# Patient Record
Sex: Female | Born: 1935 | Race: White | Hispanic: No | Smoking: Never smoker
Health system: Southern US, Community
[De-identification: ages and names within clinical notes are randomized; demographics above are authoritative.]

## PROBLEM LIST (undated history)

## (undated) DIAGNOSIS — I10 Essential (primary) hypertension: Secondary | ICD-10-CM

## (undated) DIAGNOSIS — G4730 Sleep apnea, unspecified: Secondary | ICD-10-CM

## (undated) DIAGNOSIS — I4819 Other persistent atrial fibrillation: Secondary | ICD-10-CM

## (undated) DIAGNOSIS — I5042 Chronic combined systolic (congestive) and diastolic (congestive) heart failure: Secondary | ICD-10-CM

## (undated) DIAGNOSIS — N200 Calculus of kidney: Secondary | ICD-10-CM

## (undated) DIAGNOSIS — E785 Hyperlipidemia, unspecified: Secondary | ICD-10-CM

## (undated) DIAGNOSIS — I890 Lymphedema, not elsewhere classified: Secondary | ICD-10-CM

## (undated) DIAGNOSIS — I2510 Atherosclerotic heart disease of native coronary artery without angina pectoris: Secondary | ICD-10-CM

## (undated) DIAGNOSIS — C50919 Malignant neoplasm of unspecified site of unspecified female breast: Secondary | ICD-10-CM

## (undated) DIAGNOSIS — R519 Headache, unspecified: Secondary | ICD-10-CM

## (undated) DIAGNOSIS — D649 Anemia, unspecified: Secondary | ICD-10-CM

## (undated) DIAGNOSIS — E039 Hypothyroidism, unspecified: Secondary | ICD-10-CM

## (undated) DIAGNOSIS — R51 Headache: Secondary | ICD-10-CM

## (undated) DIAGNOSIS — I255 Ischemic cardiomyopathy: Secondary | ICD-10-CM

## (undated) DIAGNOSIS — R011 Cardiac murmur, unspecified: Secondary | ICD-10-CM

## (undated) DIAGNOSIS — M129 Arthropathy, unspecified: Secondary | ICD-10-CM

## (undated) DIAGNOSIS — E079 Disorder of thyroid, unspecified: Secondary | ICD-10-CM

## (undated) DIAGNOSIS — I219 Acute myocardial infarction, unspecified: Secondary | ICD-10-CM

## (undated) DIAGNOSIS — C801 Malignant (primary) neoplasm, unspecified: Secondary | ICD-10-CM

## (undated) HISTORY — PX: HX CORONARY STENT PLACEMENT: SHX49

## (undated) HISTORY — PX: HX KNEE SURGERY: 2100001320

## (undated) HISTORY — PX: HX LYMPH NODE DISSECTION: SHX13

## (undated) HISTORY — DX: Calculus of kidney: N20.0

## (undated) HISTORY — PX: CHOLECYSTECTOMY: SHX55

## (undated) HISTORY — DX: Hypothyroidism, unspecified: E03.9

## (undated) HISTORY — DX: Atherosclerotic heart disease of native coronary artery without angina pectoris: I25.10

## (undated) HISTORY — DX: Ischemic cardiomyopathy: I25.5

## (undated) HISTORY — PX: BACK SURGERY: SHX140

## (undated) HISTORY — DX: Other persistent atrial fibrillation: I48.19

## (undated) HISTORY — DX: Hyperlipidemia, unspecified: E78.5

## (undated) HISTORY — PX: TONSILLECTOMY: SUR1361

## (undated) HISTORY — DX: Malignant neoplasm of unspecified site of unspecified female breast: C50.919

## (undated) HISTORY — PX: ABDOMINAL HYSTERECTOMY: SHX81

## (undated) HISTORY — PX: REPLACEMENT TOTAL KNEE: SUR1224

## (undated) HISTORY — DX: Headache, unspecified: R51.9

## (undated) HISTORY — DX: Sleep apnea, unspecified: G47.30

## (undated) HISTORY — DX: Chronic combined systolic (congestive) and diastolic (congestive) heart failure: I50.42

## (undated) HISTORY — PX: CARDIAC CATHETERIZATION: SHX172

## (undated) HISTORY — DX: Anemia, unspecified: D64.9

## (undated) HISTORY — DX: Headache: R51

## (undated) HISTORY — DX: Lymphedema, not elsewhere classified: I89.0

## (undated) HISTORY — PX: BREAST LUMPECTOMY: SHX2

## (undated) HISTORY — DX: Essential (primary) hypertension: I10

---

## 2012-09-29 ENCOUNTER — Ambulatory Visit (HOSPITAL_COMMUNITY): Payer: Self-pay

## 2014-08-18 ENCOUNTER — Emergency Department: Payer: Self-pay | Admitting: Emergency Medicine

## 2014-08-31 ENCOUNTER — Emergency Department: Payer: Self-pay | Admitting: Student

## 2015-03-16 DIAGNOSIS — E559 Vitamin D deficiency, unspecified: Secondary | ICD-10-CM | POA: Diagnosis not present

## 2015-03-16 DIAGNOSIS — M79643 Pain in unspecified hand: Secondary | ICD-10-CM | POA: Diagnosis not present

## 2015-03-16 DIAGNOSIS — E039 Hypothyroidism, unspecified: Secondary | ICD-10-CM | POA: Diagnosis not present

## 2015-03-16 DIAGNOSIS — Z136 Encounter for screening for cardiovascular disorders: Secondary | ICD-10-CM | POA: Diagnosis not present

## 2015-03-16 DIAGNOSIS — I10 Essential (primary) hypertension: Secondary | ICD-10-CM | POA: Diagnosis not present

## 2015-03-16 DIAGNOSIS — Z13820 Encounter for screening for osteoporosis: Secondary | ICD-10-CM | POA: Diagnosis not present

## 2015-04-25 DIAGNOSIS — H35013 Changes in retinal vascular appearance, bilateral: Secondary | ICD-10-CM | POA: Diagnosis not present

## 2015-04-25 DIAGNOSIS — H4011X2 Primary open-angle glaucoma, moderate stage: Secondary | ICD-10-CM | POA: Diagnosis not present

## 2015-04-25 DIAGNOSIS — H35011 Changes in retinal vascular appearance, right eye: Secondary | ICD-10-CM | POA: Diagnosis not present

## 2015-04-25 DIAGNOSIS — Z9842 Cataract extraction status, left eye: Secondary | ICD-10-CM | POA: Diagnosis not present

## 2015-04-25 DIAGNOSIS — Z9841 Cataract extraction status, right eye: Secondary | ICD-10-CM | POA: Diagnosis not present

## 2015-04-27 DIAGNOSIS — E039 Hypothyroidism, unspecified: Secondary | ICD-10-CM | POA: Diagnosis not present

## 2015-05-10 DIAGNOSIS — Z853 Personal history of malignant neoplasm of breast: Secondary | ICD-10-CM | POA: Diagnosis not present

## 2015-05-10 DIAGNOSIS — Z1231 Encounter for screening mammogram for malignant neoplasm of breast: Secondary | ICD-10-CM | POA: Diagnosis not present

## 2015-05-14 DIAGNOSIS — M810 Age-related osteoporosis without current pathological fracture: Secondary | ICD-10-CM | POA: Diagnosis not present

## 2015-05-14 DIAGNOSIS — M8580 Other specified disorders of bone density and structure, unspecified site: Secondary | ICD-10-CM | POA: Diagnosis not present

## 2015-09-14 DIAGNOSIS — I739 Peripheral vascular disease, unspecified: Secondary | ICD-10-CM | POA: Diagnosis not present

## 2015-09-14 DIAGNOSIS — E559 Vitamin D deficiency, unspecified: Secondary | ICD-10-CM | POA: Diagnosis not present

## 2015-09-14 DIAGNOSIS — I10 Essential (primary) hypertension: Secondary | ICD-10-CM | POA: Diagnosis not present

## 2015-09-14 DIAGNOSIS — M79643 Pain in unspecified hand: Secondary | ICD-10-CM | POA: Diagnosis not present

## 2015-09-14 DIAGNOSIS — E785 Hyperlipidemia, unspecified: Secondary | ICD-10-CM | POA: Diagnosis not present

## 2015-09-14 DIAGNOSIS — R609 Edema, unspecified: Secondary | ICD-10-CM | POA: Diagnosis not present

## 2015-09-14 DIAGNOSIS — E039 Hypothyroidism, unspecified: Secondary | ICD-10-CM | POA: Diagnosis not present

## 2015-09-14 DIAGNOSIS — Z23 Encounter for immunization: Secondary | ICD-10-CM | POA: Diagnosis not present

## 2015-09-14 DIAGNOSIS — Z136 Encounter for screening for cardiovascular disorders: Secondary | ICD-10-CM | POA: Diagnosis not present

## 2015-10-24 DIAGNOSIS — H353112 Nonexudative age-related macular degeneration, right eye, intermediate dry stage: Secondary | ICD-10-CM | POA: Diagnosis not present

## 2015-10-24 DIAGNOSIS — Z9841 Cataract extraction status, right eye: Secondary | ICD-10-CM | POA: Diagnosis not present

## 2015-10-24 DIAGNOSIS — Z9842 Cataract extraction status, left eye: Secondary | ICD-10-CM | POA: Diagnosis not present

## 2015-10-24 DIAGNOSIS — H401132 Primary open-angle glaucoma, bilateral, moderate stage: Secondary | ICD-10-CM | POA: Diagnosis not present

## 2015-11-14 DIAGNOSIS — Z853 Personal history of malignant neoplasm of breast: Secondary | ICD-10-CM | POA: Diagnosis not present

## 2015-11-14 DIAGNOSIS — M7989 Other specified soft tissue disorders: Secondary | ICD-10-CM | POA: Diagnosis not present

## 2015-11-14 DIAGNOSIS — Z1231 Encounter for screening mammogram for malignant neoplasm of breast: Secondary | ICD-10-CM | POA: Diagnosis not present

## 2015-11-14 DIAGNOSIS — M79605 Pain in left leg: Secondary | ICD-10-CM | POA: Diagnosis not present

## 2015-12-06 DIAGNOSIS — I872 Venous insufficiency (chronic) (peripheral): Secondary | ICD-10-CM | POA: Diagnosis not present

## 2015-12-06 DIAGNOSIS — R609 Edema, unspecified: Secondary | ICD-10-CM | POA: Diagnosis not present

## 2015-12-06 DIAGNOSIS — I890 Lymphedema, not elsewhere classified: Secondary | ICD-10-CM | POA: Diagnosis not present

## 2016-04-17 DIAGNOSIS — I890 Lymphedema, not elsewhere classified: Secondary | ICD-10-CM | POA: Diagnosis not present

## 2016-04-17 DIAGNOSIS — I739 Peripheral vascular disease, unspecified: Secondary | ICD-10-CM | POA: Diagnosis not present

## 2016-04-17 DIAGNOSIS — I872 Venous insufficiency (chronic) (peripheral): Secondary | ICD-10-CM | POA: Diagnosis not present

## 2016-04-18 DIAGNOSIS — I872 Venous insufficiency (chronic) (peripheral): Secondary | ICD-10-CM | POA: Diagnosis not present

## 2016-04-23 DIAGNOSIS — Z9842 Cataract extraction status, left eye: Secondary | ICD-10-CM | POA: Diagnosis not present

## 2016-04-23 DIAGNOSIS — H353112 Nonexudative age-related macular degeneration, right eye, intermediate dry stage: Secondary | ICD-10-CM | POA: Diagnosis not present

## 2016-04-23 DIAGNOSIS — Z9841 Cataract extraction status, right eye: Secondary | ICD-10-CM | POA: Diagnosis not present

## 2016-04-23 DIAGNOSIS — H401132 Primary open-angle glaucoma, bilateral, moderate stage: Secondary | ICD-10-CM | POA: Diagnosis not present

## 2016-04-23 DIAGNOSIS — H353122 Nonexudative age-related macular degeneration, left eye, intermediate dry stage: Secondary | ICD-10-CM | POA: Diagnosis not present

## 2016-05-20 DIAGNOSIS — Z1231 Encounter for screening mammogram for malignant neoplasm of breast: Secondary | ICD-10-CM | POA: Diagnosis not present

## 2016-06-18 DIAGNOSIS — I871 Compression of vein: Secondary | ICD-10-CM | POA: Diagnosis not present

## 2016-06-18 DIAGNOSIS — R600 Localized edema: Secondary | ICD-10-CM | POA: Diagnosis not present

## 2016-06-27 DIAGNOSIS — R600 Localized edema: Secondary | ICD-10-CM | POA: Diagnosis not present

## 2016-06-27 DIAGNOSIS — I700 Atherosclerosis of aorta: Secondary | ICD-10-CM | POA: Diagnosis not present

## 2016-07-04 DIAGNOSIS — Z23 Encounter for immunization: Secondary | ICD-10-CM | POA: Diagnosis not present

## 2016-07-17 DIAGNOSIS — Z9221 Personal history of antineoplastic chemotherapy: Secondary | ICD-10-CM | POA: Diagnosis not present

## 2016-07-17 DIAGNOSIS — R600 Localized edema: Secondary | ICD-10-CM | POA: Diagnosis not present

## 2016-07-17 DIAGNOSIS — K5900 Constipation, unspecified: Secondary | ICD-10-CM | POA: Diagnosis not present

## 2016-07-17 DIAGNOSIS — Z9049 Acquired absence of other specified parts of digestive tract: Secondary | ICD-10-CM | POA: Diagnosis not present

## 2016-07-17 DIAGNOSIS — K869 Disease of pancreas, unspecified: Secondary | ICD-10-CM | POA: Diagnosis not present

## 2016-07-17 DIAGNOSIS — I700 Atherosclerosis of aorta: Secondary | ICD-10-CM | POA: Diagnosis not present

## 2016-07-17 DIAGNOSIS — K7689 Other specified diseases of liver: Secondary | ICD-10-CM | POA: Diagnosis not present

## 2016-07-17 DIAGNOSIS — Z923 Personal history of irradiation: Secondary | ICD-10-CM | POA: Diagnosis not present

## 2016-07-17 DIAGNOSIS — R933 Abnormal findings on diagnostic imaging of other parts of digestive tract: Secondary | ICD-10-CM | POA: Diagnosis not present

## 2016-07-17 DIAGNOSIS — Z853 Personal history of malignant neoplasm of breast: Secondary | ICD-10-CM | POA: Diagnosis not present

## 2016-07-17 DIAGNOSIS — D489 Neoplasm of uncertain behavior, unspecified: Secondary | ICD-10-CM | POA: Diagnosis not present

## 2016-09-25 DIAGNOSIS — J189 Pneumonia, unspecified organism: Secondary | ICD-10-CM | POA: Diagnosis not present

## 2017-01-13 ENCOUNTER — Ambulatory Visit: Payer: 59 | Attending: Family Medicine

## 2017-01-16 ENCOUNTER — Encounter: Payer: Self-pay | Admitting: Family Medicine

## 2017-01-16 ENCOUNTER — Encounter (INDEPENDENT_AMBULATORY_CARE_PROVIDER_SITE_OTHER): Payer: Self-pay

## 2017-01-16 ENCOUNTER — Ambulatory Visit (INDEPENDENT_AMBULATORY_CARE_PROVIDER_SITE_OTHER): Payer: MEDICARE | Admitting: Family Medicine

## 2017-01-16 VITALS — BP 116/68 | HR 46 | Temp 97.6°F | Ht 65.5 in | Wt 190.8 lb

## 2017-01-16 DIAGNOSIS — I878 Other specified disorders of veins: Secondary | ICD-10-CM | POA: Insufficient documentation

## 2017-01-16 DIAGNOSIS — I10 Essential (primary) hypertension: Secondary | ICD-10-CM

## 2017-01-16 DIAGNOSIS — H409 Unspecified glaucoma: Secondary | ICD-10-CM | POA: Insufficient documentation

## 2017-01-16 DIAGNOSIS — E039 Hypothyroidism, unspecified: Secondary | ICD-10-CM | POA: Diagnosis not present

## 2017-01-16 DIAGNOSIS — I4891 Unspecified atrial fibrillation: Secondary | ICD-10-CM | POA: Diagnosis not present

## 2017-01-16 DIAGNOSIS — I890 Lymphedema, not elsewhere classified: Secondary | ICD-10-CM | POA: Diagnosis not present

## 2017-01-16 DIAGNOSIS — I499 Cardiac arrhythmia, unspecified: Secondary | ICD-10-CM

## 2017-01-16 DIAGNOSIS — E785 Hyperlipidemia, unspecified: Secondary | ICD-10-CM | POA: Diagnosis not present

## 2017-01-16 DIAGNOSIS — Z853 Personal history of malignant neoplasm of breast: Secondary | ICD-10-CM | POA: Diagnosis not present

## 2017-01-16 HISTORY — DX: Hypothyroidism, unspecified: E03.9

## 2017-01-16 HISTORY — DX: Hyperlipidemia, unspecified: E78.5

## 2017-01-16 HISTORY — DX: Lymphedema, not elsewhere classified: I89.0

## 2017-01-16 HISTORY — DX: Essential (primary) hypertension: I10

## 2017-01-16 LAB — COMPREHENSIVE METABOLIC PANEL
ALK PHOS: 41 U/L (ref 39–117)
ALT: 10 U/L (ref 0–35)
AST: 14 U/L (ref 0–37)
Albumin: 3.5 g/dL (ref 3.5–5.2)
BUN: 16 mg/dL (ref 6–23)
CO2: 31 meq/L (ref 19–32)
Calcium: 11.2 mg/dL — ABNORMAL HIGH (ref 8.4–10.5)
Chloride: 101 mEq/L (ref 96–112)
Creatinine, Ser: 1.33 mg/dL — ABNORMAL HIGH (ref 0.40–1.20)
GFR: 40.22 mL/min — AB (ref >60.00)
GLUCOSE: 97 mg/dL (ref 70–99)
Potassium: 3.3 mEq/L — ABNORMAL LOW (ref 3.5–5.1)
Sodium: 140 mEq/L (ref 135–145)
TOTAL PROTEIN: 6.8 g/dL (ref 6.0–8.3)
Total Bilirubin: 1.5 mg/dL — ABNORMAL HIGH (ref 0.2–1.2)

## 2017-01-16 LAB — LIPID PANEL
CHOLESTEROL: 136 mg/dL (ref 0–200)
HDL: 37.6 mg/dL — AB (ref >39.00)
LDL Cholesterol: 73 mg/dL (ref 0–99)
NONHDL: 98.43
Total CHOL/HDL Ratio: 4
Triglycerides: 129 mg/dL (ref 0.0–149.0)
VLDL: 25.8 mg/dL (ref 0.0–40.0)

## 2017-01-16 LAB — CBC
HEMATOCRIT: 39.4 % (ref 36.0–46.0)
HEMOGLOBIN: 13.1 g/dL (ref 12.0–15.0)
MCHC: 33.3 g/dL (ref 30.0–36.0)
MCV: 91.5 fl (ref 78.0–100.0)
PLATELETS: 304 10*3/uL (ref 150.0–400.0)
RBC: 4.31 Mil/uL (ref 3.87–5.11)
RDW: 14.5 % (ref 11.5–15.5)
WBC: 8.7 10*3/uL (ref 4.0–10.5)

## 2017-01-16 LAB — TSH: TSH: 2.66 u[IU]/mL (ref 0.35–4.50)

## 2017-01-16 NOTE — Assessment & Plan Note (Signed)
Irregularly irregular rhythm noted on exam. EKG was obtained and revealed atrial fibrillation. CHADS-Vasc score of 4. HAS-BLED score = 1. Patient has fallen 2 times in the recent past. Patient is a candidate for anticoagulation. Will await renal function prior to starting. Will discuss with cardiology to see if they agree.

## 2017-01-16 NOTE — Assessment & Plan Note (Signed)
Stable. Continue current dose of Synthroid. Labs today.

## 2017-01-16 NOTE — Patient Instructions (Addendum)
Let me know if she wants a mammogram.  Stop Amlodipine.  Continue all other medications.  Follow up in 1 month.  Take care  Dr. Lacinda Axon

## 2017-01-16 NOTE — Assessment & Plan Note (Signed)
Continue Zocor. Labs today.

## 2017-01-16 NOTE — Assessment & Plan Note (Signed)
Arm swelling appears to be from lymphedema. Patient is not bothered by this so will not proceed with compression sleeve.

## 2017-01-16 NOTE — Progress Notes (Signed)
Subjective:  Patient ID: Stephanie Contreras, female    DOB: February 14, 1929  Age: 83 y.o. MRN: [id number]  CC: Establish care  HPI Stephanie Contreras is a 83 y.o. female presents to the clinic today to establish care. Issues are below.  Hypertension  Patient reports long-standing hypertension.  She is currently on Lasix 40 mg daily and supplemental potassium. She is also on amlodipine 10 mg daily.  She endorses compliance.  She often gets dizziness which she attributes to a drop in her blood pressure.  We discussed her medications today.  Hyperlipidemia  Currently on simvastatin.  Unsure of control as no labs are available.  Hypothyroidism  Patient stable and a symptomatic on Synthroid 125 MCG.  Needs labs today.  Swelling of the right arm  Patient has a history of breast cancer.   Recently developed swelling of the right upper extremity, which is likely from lymphedema.  She is not bothered by it and states that she has no pain or no difficulty using her upper extremity.   PMH, Surgical Hx, Family Hx, Social History reviewed and updated as below.  Past Medical History:  Diagnosis Date  . Breast cancer ([place])    Right  . Essential hypertension 01/16/2017  . Frequent headaches   . Hyperlipidemia 01/16/2017  . Hypothyroidism 01/16/2017  . Kidney stones   . Lymphedema 01/16/2017   Past Surgical History:  Procedure Laterality Date  . ABDOMINAL HYSTERECTOMY    . BACK SURGERY    . BREAST LUMPECTOMY Right   . CHOLECYSTECTOMY    . REPLACEMENT TOTAL KNEE    . TONSILLECTOMY     Family History  Problem Relation Age of Onset  . Breast cancer Sister   . Lung cancer Sister    Social History  Substance Use Topics  . Smoking status: Never Smoker  . Smokeless tobacco: Never Used  . Alcohol use No    Review of Systems  HENT: Positive for hearing loss.   Eyes: Positive for visual disturbance.  Cardiovascular: Positive for leg swelling.  Neurological: Positive for  dizziness and headaches.  All other systems reviewed and are negative.   Objective:   Today's Vitals: BP 116/68   Pulse (!) 46   Temp 97.6 F (36.4 C)   Ht 5' 5.5" (1.664 m)   Wt 190 lb 12.8 oz (86.5 kg)   SpO2 98%   BMI 31.27 kg/m   Physical Exam  Constitutional: She appears well-developed. No distress.  HENT:  Mouth/Throat: Oropharynx is clear and moist.  Poor dentition.  Eyes: Conjunctivae are normal. Pupils are equal, round, and reactive to light.  Neck: Neck supple.  Cardiovascular:  Irregularly irregular. 2/6 systolic murmur.  Pulmonary/Chest: Effort normal and breath sounds normal. She has no wheezes. She has no rales.  Abdominal: Soft. She exhibits no distension. There is no tenderness. There is no rebound and no guarding.  Lymphadenopathy:    She has no cervical adenopathy.  Neurological: She is alert.  Skin:  Lower extremities with venous stasis changes/erythema.  Psychiatric: She has a normal mood and affect.  Vitals reviewed.  Assessment & Plan:   Problem List Items Addressed This Visit    History of breast cancer   Relevant Orders   CBC   Lymphedema    Arm swelling appears to be from lymphedema. Patient is not bothered by this so will not proceed with compression sleeve.      Relevant Orders   CBC   Essential hypertension - Primary  Her blood pressures are well controlled, likely to well controlled. Stopping amlodipine.      Relevant Medications   furosemide (LASIX) 40 MG tablet   simvastatin (ZOCOR) 40 MG tablet   Other Relevant Orders   Comp Met (CMET)   Hyperlipidemia    Continue Zocor. Labs today.      Relevant Medications   furosemide (LASIX) 40 MG tablet   simvastatin (ZOCOR) 40 MG tablet   Other Relevant Orders   Lipid panel   Hypothyroidism    Stable. Continue current dose of Synthroid. Labs today.      Relevant Medications   levothyroxine (SYNTHROID, LEVOTHROID) 125 MCG tablet   Other Relevant Orders   TSH   New onset  atrial fibrillation (HCC)    Irregularly irregular rhythm noted on exam. EKG was obtained and revealed atrial fibrillation. CHADS-Vasc score of 4. HAS-BLED score = 1. Patient has fallen 2 times in the recent past. Patient is a candidate for anticoagulation. Will await renal function prior to starting. Will discuss with cardiology to see if they agree.       Relevant Medications   furosemide (LASIX) 40 MG tablet   simvastatin (ZOCOR) 40 MG tablet   Other Relevant Orders   EKG 12-Lead (Completed)   Comp Met (CMET)      Outpatient Encounter Prescriptions as of 01/16/2017  Medication Sig  . Calcium Carb-Cholecalciferol (CALTRATE 600+D3 SOFT PO) Take by mouth 3 (three) times daily before meals.  . furosemide (LASIX) 40 MG tablet Take 40 mg by mouth daily.  Marland Kitchen levothyroxine (SYNTHROID, LEVOTHROID) 125 MCG tablet Take 125 mcg by mouth daily.  . Multiple Vitamins-Minerals (EYE VITAMINS PO) Take by mouth 2 (two) times daily.  . Multiple Vitamins-Minerals (MULTIVITAMIN ADULT PO) Take by mouth. Centrum silver  . OVER THE COUNTER MEDICATION combrigan eye drops. 1 drop in each eye BID.  Marland Kitchen POTASSIUM CHLORIDE ER PO Take 10 mg by mouth daily.  . simvastatin (ZOCOR) 40 MG tablet Take 40 mg by mouth daily.  . [DISCONTINUED] amLODipine (NORVASC) 10 MG tablet Take 10 mg by mouth daily.   No facility-administered encounter medications on file as of 01/16/2017.     Follow-up: Return in about 1 month (around 02/16/2017).  [place]

## 2017-01-16 NOTE — Progress Notes (Signed)
Pre visit review using our clinic review tool, if applicable. No additional management support is needed unless otherwise documented below in the visit note. 

## 2017-01-16 NOTE — Assessment & Plan Note (Signed)
Her blood pressures are well controlled, likely to well controlled. Stopping amlodipine.

## 2017-01-19 ENCOUNTER — Ambulatory Visit: Payer: 59

## 2017-01-20 ENCOUNTER — Other Ambulatory Visit: Payer: Self-pay | Admitting: Family Medicine

## 2017-01-20 MED ORDER — POTASSIUM CHLORIDE CRYS ER 20 MEQ PO TBCR: 40.0000 meq | EXTENDED_RELEASE_TABLET | Freq: Every day | ORAL | 0 refills | Status: DC

## 2017-01-21 ENCOUNTER — Other Ambulatory Visit: Payer: Self-pay | Admitting: Family Medicine

## 2017-01-21 ENCOUNTER — Ambulatory Visit: Payer: 59

## 2017-02-04 ENCOUNTER — Other Ambulatory Visit: Payer: MEDICARE

## 2017-02-05 ENCOUNTER — Other Ambulatory Visit (INDEPENDENT_AMBULATORY_CARE_PROVIDER_SITE_OTHER): Payer: MEDICARE

## 2017-02-05 LAB — COMPREHENSIVE METABOLIC PANEL
ALBUMIN: 3.3 g/dL — AB (ref 3.5–5.2)
ALT: 15 U/L (ref 0–35)
AST: 17 U/L (ref 0–37)
Alkaline Phosphatase: 44 U/L (ref 39–117)
BUN: 16 mg/dL (ref 6–23)
CALCIUM: 10.6 mg/dL — AB (ref 8.4–10.5)
CHLORIDE: 105 meq/L (ref 96–112)
CO2: 31 mEq/L (ref 19–32)
Creatinine, Ser: 1.15 mg/dL (ref 0.40–1.20)
GFR: 47.56 mL/min — AB (ref >60.00)
Glucose, Bld: 110 mg/dL — ABNORMAL HIGH (ref 70–99)
POTASSIUM: 3.5 meq/L (ref 3.5–5.1)
Sodium: 144 mEq/L (ref 135–145)
Total Bilirubin: 0.8 mg/dL (ref 0.2–1.2)
Total Protein: 6.4 g/dL (ref 6.0–8.3)

## 2017-02-06 ENCOUNTER — Telehealth: Payer: Self-pay

## 2017-02-06 LAB — PARATHYROID HORMONE, INTACT (NO CA): PTH: 32 pg/mL (ref 14–64)

## 2017-02-06 NOTE — Telephone Encounter (Signed)
Pt called and gave verbal okay for referral.

## 2017-02-06 NOTE — Telephone Encounter (Signed)
-----   Message from Coral Spikes, DO sent at 02/06/2017  4:16 PM EDT ----- Spoke with cardiology.  They would like to see her.  She okay with that?  Dr. Lacinda Axon

## 2017-02-09 NOTE — Telephone Encounter (Signed)
See results note. 

## 2017-02-09 NOTE — Telephone Encounter (Signed)
Pt's granddaughter called and said to go ahead and put the referral in for cardiology. Granddaughter says that they can do any day but 4/19.

## 2017-02-10 ENCOUNTER — Other Ambulatory Visit: Payer: Self-pay | Admitting: Family Medicine

## 2017-02-10 DIAGNOSIS — I4891 Unspecified atrial fibrillation: Secondary | ICD-10-CM

## 2017-02-16 ENCOUNTER — Encounter: Payer: Self-pay | Admitting: Family Medicine

## 2017-02-16 ENCOUNTER — Ambulatory Visit (INDEPENDENT_AMBULATORY_CARE_PROVIDER_SITE_OTHER): Payer: MEDICARE | Admitting: Family Medicine

## 2017-02-16 DIAGNOSIS — G4733 Obstructive sleep apnea (adult) (pediatric): Secondary | ICD-10-CM

## 2017-02-16 DIAGNOSIS — I10 Essential (primary) hypertension: Secondary | ICD-10-CM | POA: Diagnosis not present

## 2017-02-16 DIAGNOSIS — I4891 Unspecified atrial fibrillation: Secondary | ICD-10-CM | POA: Diagnosis not present

## 2017-02-16 NOTE — Patient Instructions (Addendum)
We will arrange your sleep study.   Call Endo.  Be sure to see cardiology.  Follow up in 6 months.  Take care  Dr. Lacinda Axon

## 2017-02-16 NOTE — Progress Notes (Signed)
Pre visit review using our clinic review tool, if applicable. No additional management support is needed unless otherwise documented below in the visit note. 

## 2017-02-16 NOTE — Progress Notes (Signed)
Subjective:  Patient ID: Stephanie Contreras, female    DOB: [birth year]  Age: 83 y.o. MRN: [id number]  CC: Follow up  HPI:  83 year old female with  Afib, Hypothyroidism, Hyperlipidemia, Hypercalcemia, Hypertension presents for follow-up.  Atrial fibrillation  Found at last visit.  She has an upcoming cardiology appointment.  I did not start her on anticoagulation given prior falls.  Cardiologist to discuss at upcoming visit in May. She is currently asymptomatic.  HTN  Fair control given advanced age.  Currently on Lasix.  Hypercalcemia  Initial calcium was 11.2.  Repeat was 10.6.  Normal PTH.  I placed a referral to endocrinology and they have contacted the patient. They were unable to get her on the phone.  Currently asymptomatic. Has a prior history of kidney stones.  Difficulty sleeping  Patient reports that she does not sleep well.  She states that this is due to the fact that she no longer has a working CPAP.  She would like to have a repeat sleep study and if confirms a CPAP device.  Social Hx   Social History   Social History  . Marital status: Widowed    Spouse name: N/A  . Number of children: N/A  . Years of education: N/A   Social History Main Topics  . Smoking status: Never Smoker  . Smokeless tobacco: Never Used  . Alcohol use No  . Drug use: No  . Sexual activity: Not Asked   Other Topics Concern  . None   Social History Narrative  . None    Review of Systems  Constitutional: Positive for fatigue.  Psychiatric/Behavioral: Positive for sleep disturbance.   Objective:  BP (!) 154/88   Pulse 95   Temp 97.6 F (36.4 C) (Oral)   Wt 185 lb 6 oz (84.1 kg)   SpO2 93%   BMI 30.38 kg/m   BP/Weight 02/16/2017 0/25/8527  Systolic BP 782 423  Diastolic BP 88 68  Wt. (Lbs) 185.38 190.8  BMI 30.38 31.27   Physical Exam  Constitutional: She appears well-developed. No distress.  Cardiovascular: An irregularly irregular rhythm present.   Pulmonary/Chest: Effort normal and breath sounds normal.  Neurological: She is alert.  Psychiatric: She has a normal mood and affect.  Vitals reviewed.   Lab Results  Component Value Date   WBC 8.7 01/16/2017   HGB 13.1 01/16/2017   HCT 39.4 01/16/2017   PLT 304.0 01/16/2017   GLUCOSE 110 (H) 02/05/2017   CHOL 136 01/16/2017   TRIG 129.0 01/16/2017   HDL 37.60 (L) 01/16/2017   LDLCALC 73 01/16/2017   ALT 15 02/05/2017   AST 17 02/05/2017   NA 144 02/05/2017   K 3.5 02/05/2017   CL 105 02/05/2017   CREATININE 1.15 02/05/2017   BUN 16 02/05/2017   CO2 31 02/05/2017   TSH 2.66 01/16/2017    Assessment & Plan:   Problem List Items Addressed This Visit    OSA (obstructive sleep apnea)    Patient endorsing OSA. Sleep study.      Relevant Orders   Split night study   New onset atrial fibrillation [facility])    Awaiting cardiology appt. Unsure if benefit outweighs risk given age and falls. Appt 5/3.      Hypercalcemia    Persistent. Low-Normal PTH. Advised to call endo for appt.      Essential hypertension    Fair control. Continue Lasix.         Follow-up: 6 months  Randy Castrejon  [place]

## 2017-02-16 NOTE — Assessment & Plan Note (Signed)
Persistent. Low-Normal PTH. Advised to call endo for appt.

## 2017-02-16 NOTE — Assessment & Plan Note (Signed)
Patient endorsing OSA. Sleep study.

## 2017-02-16 NOTE — Assessment & Plan Note (Signed)
Awaiting cardiology appt. Unsure if benefit outweighs risk given age and falls. Appt 5/3.

## 2017-02-16 NOTE — Assessment & Plan Note (Signed)
Fair control. Continue Lasix.

## 2017-02-24 ENCOUNTER — Encounter: Payer: Self-pay | Admitting: Emergency Medicine

## 2017-02-24 ENCOUNTER — Emergency Department: Payer: MEDICARE

## 2017-02-24 ENCOUNTER — Emergency Department
Admission: EM | Admit: 2017-02-24 | Discharge: 2017-02-24 | Disposition: A | Discharge status: Home / Self Care | Payer: MEDICARE | Source: Home / Self Care | Attending: Emergency Medicine | Admitting: Emergency Medicine

## 2017-02-24 DIAGNOSIS — I248 Other forms of acute ischemic heart disease: Secondary | ICD-10-CM | POA: Diagnosis not present

## 2017-02-24 DIAGNOSIS — I10 Essential (primary) hypertension: Secondary | ICD-10-CM | POA: Insufficient documentation

## 2017-02-24 DIAGNOSIS — R0789 Other chest pain: Secondary | ICD-10-CM | POA: Diagnosis not present

## 2017-02-24 DIAGNOSIS — I509 Heart failure, unspecified: Secondary | ICD-10-CM | POA: Diagnosis not present

## 2017-02-24 DIAGNOSIS — I5021 Acute systolic (congestive) heart failure: Secondary | ICD-10-CM | POA: Diagnosis not present

## 2017-02-24 DIAGNOSIS — E039 Hypothyroidism, unspecified: Secondary | ICD-10-CM | POA: Insufficient documentation

## 2017-02-24 DIAGNOSIS — I429 Cardiomyopathy, unspecified: Secondary | ICD-10-CM | POA: Diagnosis not present

## 2017-02-24 DIAGNOSIS — Z853 Personal history of malignant neoplasm of breast: Secondary | ICD-10-CM

## 2017-02-24 DIAGNOSIS — Z79899 Other long term (current) drug therapy: Secondary | ICD-10-CM | POA: Insufficient documentation

## 2017-02-24 DIAGNOSIS — J189 Pneumonia, unspecified organism: Secondary | ICD-10-CM

## 2017-02-24 DIAGNOSIS — R0602 Shortness of breath: Secondary | ICD-10-CM | POA: Diagnosis not present

## 2017-02-24 DIAGNOSIS — I110 Hypertensive heart disease with heart failure: Secondary | ICD-10-CM | POA: Diagnosis not present

## 2017-02-24 DIAGNOSIS — N179 Acute kidney failure, unspecified: Secondary | ICD-10-CM | POA: Diagnosis not present

## 2017-02-24 LAB — BASIC METABOLIC PANEL
Anion gap: 8 (ref 5–15)
BUN: 12 mg/dL (ref 6–20)
CALCIUM: 9.2 mg/dL (ref 8.9–10.3)
CO2: 26 mmol/L (ref 22–32)
CREATININE: 0.96 mg/dL (ref 0.44–1.00)
Chloride: 105 mmol/L (ref 101–111)
GFR calc non Af Amer: 52 mL/min — ABNORMAL LOW (ref >60)
GLUCOSE: 107 mg/dL — AB (ref 65–99)
Potassium: 4 mmol/L (ref 3.5–5.1)
Sodium: 139 mmol/L (ref 135–145)

## 2017-02-24 LAB — CBC
HCT: 40.2 % (ref 35.0–47.0)
Hemoglobin: 13.4 g/dL (ref 12.0–16.0)
MCH: 30 pg (ref 26.0–34.0)
MCHC: 33.4 g/dL (ref 32.0–36.0)
MCV: 90 fL (ref 80.0–100.0)
PLATELETS: 387 10*3/uL (ref 150–440)
RBC: 4.46 MIL/uL (ref 3.80–5.20)
RDW: 14.7 % — AB (ref 11.5–14.5)
WBC: 10.9 10*3/uL (ref 3.6–11.0)

## 2017-02-24 LAB — TROPONIN I

## 2017-02-24 MED ORDER — LEVOFLOXACIN 750 MG PO TABS
750.0000 mg | ORAL_TABLET | Freq: Every day | ORAL | 0 refills | Status: DC
Start: 2017-02-24 — End: 2017-03-06

## 2017-02-24 MED ORDER — SODIUM CHLORIDE 0.9 % IV BOLUS (SEPSIS)
500.0000 mL | Freq: Once | INTRAVENOUS | Status: AC
  Administered 2017-02-24: 500 mL via INTRAVENOUS

## 2017-02-24 MED ORDER — LEVOFLOXACIN IN D5W 750 MG/150ML IV SOLN
750.0000 mg | Freq: Once | INTRAVENOUS | Status: AC
  Administered 2017-02-24: 750 mg via INTRAVENOUS
  Filled 2017-02-24: qty 150

## 2017-02-24 NOTE — ED Notes (Addendum)
Pt. States that last night she laid down and couldn't breath. States that it happened again today. Has a history of A-Fib. Dr. Lacinda Axon referred her to a heart specialists at [facility]. Her appointment is on May 3 at 1:00. Pt reports no pain just "tightness". She is extremely out of breath. History of breast cancer on right side

## 2017-02-24 NOTE — ED Triage Notes (Signed)
Patient presents to ED via POV from home with c/o CP and SOB x 3 days. Hx of a fib. A&O x4. Hard of hearing.

## 2017-02-24 NOTE — Discharge Instructions (Signed)
Please seek medical attention for any high fevers, chest pain, shortness of breath, change in behavior, persistent vomiting, bloody stool or any other new or concerning symptoms.  

## 2017-02-24 NOTE — ED Notes (Signed)
PT up to toilet with one assist. Pt reported wanting to ambulate around the room. Pt became SOB and grabbed chest reporting it was hard to breath. Pt assisted back into bed. No SOB at this time while at rest.

## 2017-02-24 NOTE — ED Provider Notes (Signed)
[facility] Emergency Department Provider Note   ____________________________________________   I have reviewed the triage vital signs and the nursing notes.   HISTORY  Chief Complaint Chest Pain   History limited by: Not Limited   HPI Stephanie Contreras is a 83 y.o. female who presents to the emergency department today because of concerns for weakness, shortness of breath and dizziness. She states the symptoms started last night. Continued throughout the night and the patient was not able to sleep well. States she has had a little, but it does not sound like it's been productive. Patient denied any chest pain. Patient denied any fevers.   Past Medical History:  Diagnosis Date  . Atrial fibrillation ([place])   . Breast cancer ([place])    Right  . Essential hypertension 01/16/2017  . Frequent headaches   . Hyperlipidemia 01/16/2017  . Hypothyroidism 01/16/2017  . Kidney stones   . Lymphedema 01/16/2017    Patient Active Problem List   Diagnosis Date Noted  . OSA (obstructive sleep apnea) 02/16/2017  . Hypercalcemia 02/10/2017  . History of breast cancer 01/16/2017  . Lymphedema 01/16/2017  . Essential hypertension 01/16/2017  . Hyperlipidemia 01/16/2017  . Hypothyroidism 01/16/2017  . Venous stasis 01/16/2017  . Glaucoma 01/16/2017  . New onset atrial fibrillation ([place]) 01/16/2017    Past Surgical History:  Procedure Laterality Date  . ABDOMINAL HYSTERECTOMY    . BACK SURGERY    . BREAST LUMPECTOMY Right   . CHOLECYSTECTOMY    . REPLACEMENT TOTAL KNEE    . TONSILLECTOMY      Prior to Admission medications   Medication Sig Start Date End Date Taking? Authorizing Provider  Calcium Carb-Cholecalciferol (CALTRATE 600+D3 SOFT PO) Take by mouth 3 (three) times daily before meals.    Historical Provider, MD  furosemide (LASIX) 40 MG tablet Take 40 mg by mouth daily.    Historical Provider, MD  levothyroxine (SYNTHROID, LEVOTHROID) 125 MCG tablet Take  125 mcg by mouth daily.    Historical Provider, MD  Multiple Vitamins-Minerals (EYE VITAMINS PO) Take by mouth 2 (two) times daily.    Historical Provider, MD  Multiple Vitamins-Minerals (MULTIVITAMIN ADULT PO) Take by mouth. Centrum silver    Historical Provider, MD  OVER THE COUNTER MEDICATION combrigan eye drops. 1 drop in each eye BID.    Historical Provider, MD  potassium chloride SA (K-DUR,KLOR-CON) 20 MEQ tablet Take 2 tablets (40 mEq total) by mouth daily. 01/20/17   Coral Spikes, DO  simvastatin (ZOCOR) 40 MG tablet Take 40 mg by mouth daily.    Historical Provider, MD    Allergies Patient has no known allergies.  Family History  Problem Relation Age of Onset  . Breast cancer Sister   . Lung cancer Sister     Social History Social History  Substance Use Topics  . Smoking status: Never Smoker  . Smokeless tobacco: Never Used  . Alcohol use No    Review of Systems Constitutional: No fever. Positive for weakness.  Eyes: No visual changes. ENT: No sore throat. Cardiovascular: Denies chest pain. Respiratory: Positive for shortness of breath. Gastrointestinal: No abdominal pain.  No nausea, no vomiting.  No diarrhea.   Genitourinary: Negative for dysuria. Musculoskeletal: Negative for back pain. Skin: Negative for rash. Neurological: Positive for dizziness. ____________________________________________   PHYSICAL EXAM:  VITAL SIGNS: ED Triage Vitals  Enc Vitals Group     BP 02/24/17 1642 (!) 143/101     Pulse Rate 02/24/17 1642 88  Resp 02/24/17 1642 17     Temp 02/24/17 1642 98 F (36.7 C)     Temp Source 02/24/17 1642 Oral     SpO2 02/24/17 1642 95 %     Weight 02/24/17 1639 180 lb (81.6 kg)     Height 02/24/17 1639 5\' 9"  (1.753 m)     Head Circumference --      Peak Flow --      Pain Score 02/24/17 1638 5   Constitutional: Alert and oriented. Well appearing and in no distress. Eyes: Conjunctivae are normal. Normal extraocular movements. ENT    Head: Normocephalic and atraumatic.   Nose: No congestion/rhinnorhea.   Mouth/Throat: Mucous membranes are moist.   Neck: No stridor. Hematological/Lymphatic/Immunilogical: No cervical lymphadenopathy. Cardiovascular: Normal rate, regular rhythm.  No murmurs, rubs, or gallops.  Respiratory: Normal respiratory effort without tachypnea nor retractions. Breath sounds are clear and equal bilaterally. No wheezes/rales/rhonchi. Gastrointestinal: Soft and non tender. No rebound. No guarding.  Genitourinary: Deferred Musculoskeletal: Normal range of motion in all extremities. No lower extremity edema. Neurologic:  Normal speech and language. No gross focal neurologic deficits are appreciated.  Skin:  Skin is warm, dry and intact. No rash noted. Psychiatric: Mood and affect are normal. Speech and behavior are normal. Patient exhibits appropriate insight and judgment.  ____________________________________________    LABS (pertinent positives/negatives)  Labs Reviewed  BASIC METABOLIC PANEL - Abnormal; Notable for the following:       Result Value   Glucose, Bld 107 (*)    GFR calc non Af Amer 52 (*)    All other components within normal limits  CBC - Abnormal; Notable for the following:    RDW 14.7 (*)    All other components within normal limits  TROPONIN I     ____________________________________________   EKG  I, Nance Pear, attending physician, personally viewed and interpreted this EKG  EKG Time: 1641 Rate: 101 Rhythm: atrial fibrillation Axis: normal Intervals: qtc 451 QRS: narrow, q waves V1 ST changes: no st elevation Impression: abnormal ekg ____________________________________________    RADIOLOGY  CXR  IMPRESSION: Multifocal airspace process suggesting pneumonia with small left-sided pleural  effusion.  Cardiomegaly.  ____________________________________________   PROCEDURES  Procedures  ____________________________________________   INITIAL IMPRESSION / ASSESSMENT AND PLAN / ED COURSE  Pertinent labs & imaging results that were available during my care of the patient were reviewed by me and considered in my medical decision making (see chart for details).  Patient's workup here in the emergency department is concerning for pneumonia. This could explain the patient's symptoms. Patient is low risk on curb 65 scoring. Will plan on giving patient dose of IV antibiotics here in the emergency Department and discharging with further antibiotics.  ____________________________________________   FINAL CLINICAL IMPRESSION(S) / ED DIAGNOSES  Final diagnoses:  Community acquired pneumonia, unspecified laterality     Note: This dictation was prepared with Sales executive. Any transcriptional errors that result from this process are unintentional     Nance Pear, MD 02/24/17 2237

## 2017-02-26 ENCOUNTER — Encounter: Payer: Self-pay | Admitting: Cardiology

## 2017-02-26 ENCOUNTER — Ambulatory Visit (INDEPENDENT_AMBULATORY_CARE_PROVIDER_SITE_OTHER): Payer: MEDICARE | Admitting: Cardiology

## 2017-02-26 VITALS — BP 138/78 | HR 103 | Ht 69.0 in | Wt 186.0 lb

## 2017-02-26 DIAGNOSIS — I4891 Unspecified atrial fibrillation: Secondary | ICD-10-CM

## 2017-02-26 MED ORDER — APIXABAN 5 MG PO TABS: 5.0000 mg | ORAL_TABLET | Freq: Two times a day (BID) | ORAL | 6 refills | Status: DC

## 2017-02-26 NOTE — Progress Notes (Signed)
Cardiology Office Note   Date:  02/26/2017   ID:  Stephanie K Gavin, DOB [birth year], MRN [id number]  Referring Doctor:  Coral Spikes, DO   Cardiologist:   Wende Bushy, MD   Reason for consultation:  Chief Complaint  Patient presents with  . other    New patient. Referred by Thersa Salt for new onset afib. Patient c/o swelling in legs and Arms. Meds reviewed verbally with patient.       History of Present Illness: Stephanie Contreras is a 83 y.o. female who is being seen today for the evaluation of Atrial fibrillation at the request of Coral Spikes, DO.  Patient reports that she has not been aware of any rhythm change. No palpitations. She denies chest pains. No shortness of breath prior to this episode of pneumonia.  She uses a walker mainly to assist with walking. She moved recently from [place]. Since August of last year she had some issues with being off balance and suffering from minor falls 3 times. Since the last time in February, she and her family had made a conscious effort to prevent any more falls. She now consistently uses her walker. She avoids uneven pavement. There is always someone from her family with her at home. She is very conscious about making sure she avoid any more falls.  She denies PND, orthopnea. She has chronic edema. She takes Lasix. No syncope.  ROS:  Please see the history of present illness. Aside from mentioned under HPI, all other systems are reviewed and negative.     Past Medical History:  Diagnosis Date  . Atrial fibrillation ([place])   . Breast cancer ([place])    Right  . Essential hypertension 01/16/2017  . Frequent headaches   . Hyperlipidemia 01/16/2017  . Hypothyroidism 01/16/2017  . Kidney stones   . Lymphedema 01/16/2017    Past Surgical History:  Procedure Laterality Date  . ABDOMINAL HYSTERECTOMY    . BACK SURGERY    . BREAST LUMPECTOMY Right   . CHOLECYSTECTOMY    . REPLACEMENT TOTAL KNEE    . TONSILLECTOMY       reports  that she has never smoked. She has never used smokeless tobacco. She reports that she does not drink alcohol or use drugs.   family history includes Breast cancer in her sister; Lung cancer in her sister.   Outpatient Medications Prior to Visit  Medication Sig Dispense Refill  . Calcium Carb-Cholecalciferol (CALTRATE 600+D3 SOFT PO) Take by mouth 3 (three) times daily before meals.    . furosemide (LASIX) 40 MG tablet Take 40 mg by mouth daily.    Marland Kitchen levofloxacin (LEVAQUIN) 750 MG tablet Take 1 tablet (750 mg total) by mouth daily. 7 tablet 0  . levothyroxine (SYNTHROID, LEVOTHROID) 125 MCG tablet Take 125 mcg by mouth daily.    . Multiple Vitamins-Minerals (EYE VITAMINS PO) Take by mouth 2 (two) times daily.    . Multiple Vitamins-Minerals (MULTIVITAMIN ADULT PO) Take by mouth. Centrum silver    . OVER THE COUNTER MEDICATION combrigan eye drops. 1 drop in each eye BID.    Marland Kitchen potassium chloride SA (K-DUR,KLOR-CON) 20 MEQ tablet Take 2 tablets (40 mEq total) by mouth daily. 90 tablet 0  . simvastatin (ZOCOR) 40 MG tablet Take 40 mg by mouth daily.     No facility-administered medications prior to visit.      Allergies: Patient has no known allergies.    PHYSICAL EXAM: VS:  BP 138/78 (  BP Location: Left Arm, Patient Position: Sitting, Cuff Size: Normal)   Pulse (!) 103   Ht 5\' 9"  (1.753 m)   Wt 186 lb (84.4 kg)   BMI 27.47 kg/m  , Body mass index is 27.47 kg/m. Wt Readings from Last 3 Encounters:  02/26/17 186 lb (84.4 kg)  02/24/17 180 lb (81.6 kg)  02/16/17 185 lb 6 oz (84.1 kg)    GENERAL:  well developed, well nourished, not in acute distress HEENT: normocephalic, pink conjunctivae, anicteric sclerae, no xanthelasma, normal dentition, oropharynx clear NECK:  no neck vein engorgement, JVP normal, no hepatojugular reflux, carotid upstroke brisk and symmetric, no bruit, no thyromegaly, no lymphadenopathy LUNGS:  good respiratory effort, clear to auscultation bilaterally CV:  PMI  not displaced, no thrills, no lifts, Irregular rhythm, S2 within normal limits, no palpable S3 or S4, soft systolic murmur, no rubs, no gallops ABD:  Soft, nontender, nondistended, normoactive bowel sounds, no abdominal aortic bruit, no hepatomegaly, no splenomegaly MS: nontender back, no kyphosis, no scoliosis, no joint deformities EXT:  2+ DP/PT pulses, +1 edema, no varicosities, no cyanosis, no clubbing SKIN: warm, nondiaphoretic, normal turgor, no ulcers NEUROPSYCH: alert, oriented to person, place, and time, sensory/motor grossly intact, normal mood, appropriate affect  Recent Labs: 01/16/2017: TSH 2.66 02/05/2017: ALT 15 02/24/2017: BUN 12; Creatinine, Ser 0.96; Hemoglobin 13.4; Platelets 387; Potassium 4.0; Sodium 139   Lipid Panel    Component Value Date/Time   CHOL 136 01/16/2017 1116   TRIG 129.0 01/16/2017 1116   HDL 37.60 (L) 01/16/2017 1116   CHOLHDL 4 01/16/2017 1116   VLDL 25.8 01/16/2017 1116   LDLCALC 73 01/16/2017 1116     Other studies Reviewed:  EKG:  The ekg from 02/26/2017 was personally reviewed by me and it revealed atrial fibrillation, ventricular rate of 103 BPM. Nonspecific ST-T wave changes.  Additional studies/ records that were reviewed personally reviewed by me today include: None available   ASSESSMENT AND PLAN:  Atrial fibrillation, newly diagnosed Asymptomatic per patient Prior to this episode of pneumonia, her heart rate was in the 80s on the EKG at Dr. Jonathon Jordan office We had a lengthy and detailed discussion together with her granddaughter regarding the nature of atrial fibrillation, pathophysiology, increased risk of stroke, stroke risk reduction. We discussed at length her stroke risk versus her fall risk. She verbalized understanding. She is very much concerned about her risks of stroke and would rather avoid it by taking [place]. She is very careful with her gait and her balance now. Recommend Eliquis 5mg  bid.  Recommend, if deemed necessary,  evaluation of her gait or balance issues. May benefit from physical therapy to help strengthen muscles to support her balance. Will defer to PCP. Recommend to check echocardiogram. Recommend to monitor blood pressure and heart rate. Her heart rate right now is probably appropriate since she was just diagnosed with pneumonia and*just started treatment for this. She will call for a follow-up with her PCP regarding this.  Current medicines are reviewed at length with the patient today.  The patient does not have concerns regarding medicines.  Labs/ tests ordered today include:  Orders Placed This Encounter  Procedures  . EKG 12-Lead  . ECHOCARDIOGRAM COMPLETE    I had a lengthy and detailed discussion with the patient regarding diagnoses, prognosis, diagnostic options, treatment options , and side effects of medications.    Disposition:   FU with Cardiology after tests    Thank you for this consultation. We will forwarding this consultation  to referring physician.   I spent at least 45 minutes with the patient today and more than 50% of the time was spent counseling the patient and coordinating care.   Signed, Wende Bushy, MD  02/26/2017 2:11 PM    [place]  This note was generated in part with voice recognition software and I apologize for any typographical errors that were not detected and corrected.

## 2017-02-26 NOTE — Patient Instructions (Addendum)
Medication Instructions:  Your physician has recommended you make the following change in your medication:  1. START Eliquis 5 mg twice a day  Medication Samples have been provided to the patient.  Drug name: Eliquis       Strength: 5 mg        Qty: 4 boxes  LOT: [id number]  Exp.Date: 4/20  Dosing instructions: Take 1 tablet twice a day.  Testing/Procedures: Your physician has requested that you have an echocardiogram. Echocardiography is a painless test that uses sound waves to create images of your heart. It provides your doctor with information about the size and shape of your heart and how well your heart's chambers and valves are working. This procedure takes approximately one hour. There are no restrictions for this procedure.   Follow-Up: Your physician recommends that you schedule a follow-up appointment after testing.  It was a pleasure seeing you today here in the office. Please do not hesitate to give [place] a call back if you have any further questions. [place], BSN   Echocardiogram An echocardiogram, or echocardiography, uses sound waves (ultrasound) to produce an image of your heart. The echocardiogram is simple, painless, obtained within a short period of time, and offers valuable information to your health care provider. The images from an echocardiogram can provide information such as:  Evidence of coronary artery disease (CAD).  Heart size.  Heart muscle function.  Heart valve function.  Aneurysm detection.  Evidence of a past heart attack.  Fluid buildup around the heart.  Heart muscle thickening.  Assess heart valve function. Tell a health care provider about:  Any allergies you have.  All medicines you are taking, including vitamins, herbs, eye drops, creams, and over-the-counter medicines.  Any problems you or family members have had with anesthetic medicines.  Any blood disorders you have.  Any surgeries you have had.  Any  medical conditions you have.  Whether you are pregnant or may be pregnant. What happens before the procedure? No special preparation is needed. Eat and drink normally. What happens during the procedure?  In order to produce an image of your heart, gel will be applied to your chest and a wand-like tool (transducer) will be moved over your chest. The gel will help transmit the sound waves from the transducer. The sound waves will harmlessly bounce off your heart to allow the heart images to be captured in real-time motion. These images will then be recorded.  You may need an IV to receive a medicine that improves the quality of the pictures. What happens after the procedure? You may return to your normal schedule including diet, activities, and medicines, unless your health care provider tells you otherwise. This information is not intended to replace advice given to you by your health care provider. Make sure you discuss any questions you have with your health care provider. Document Released: 10/10/2000 Document Revised: 05/31/2016 Document Reviewed: 06/20/2013 Elsevier Interactive Patient Education  2017 [employer].  Atrial Fibrillation Atrial fibrillation is a type of heartbeat that is irregular or fast (rapid). If you have this condition, your heart keeps quivering in a weird (chaotic) way. This condition can make it so your heart cannot pump blood normally. Having this condition gives a person more risk for stroke, heart failure, and other heart problems. There are different types of atrial fibrillation. Talk with your doctor to learn about the type that you have. Follow these instructions at home:  Take over-the-counter and prescription medicines  only as told by your doctor.  If your doctor prescribed a blood-thinning medicine, take it exactly as told. Taking too much of it can cause bleeding. If you do not take enough of it, you will not have the protection that you need against stroke  and other problems.  Do not use any tobacco products. These include cigarettes, chewing tobacco, and e-cigarettes. If you need help quitting, ask your doctor.  If you have apnea (obstructive sleep apnea), manage it as told by your doctor.  Do not drink alcohol.  Do not drink beverages that have caffeine. These include coffee, soda, and tea.  Maintain a healthy weight. Do not use diet pills unless your doctor says they are safe for you. Diet pills may make heart problems worse.  Follow diet instructions as told by your doctor.  Exercise regularly as told by your doctor.  Keep all follow-up visits as told by your doctor. This is important. Contact a doctor if:  You notice a change in the speed, rhythm, or strength of your heartbeat.  You are taking a blood-thinning medicine and you notice more bruising.  You get tired more easily when you move or exercise. Get help right away if:  You have pain in your chest or your belly (abdomen).  You have sweating or weakness.  You feel sick to your stomach (nauseous).  You notice blood in your throw up (vomit), poop (stool), or pee (urine).  You are short of breath.  You suddenly have swollen feet and ankles.  You feel dizzy.  Your suddenly get weak or numb in your face, arms, or legs, especially if it happens on one side of your body.  You have trouble talking, trouble understanding, or both.  Your face or your eyelid droops on one side. These symptoms may be an emergency. Do not wait to see if the symptoms will go away. Get medical help right away. Call your local emergency services (911 in the [place] Do not drive yourself to the hospital. This information is not intended to replace advice given to you by your health care provider. Make sure you discuss any questions you have with your health care provider. Document Released: 07/22/2008 Document Revised: 03/20/2016 Document Reviewed: 02/07/2015 Elsevier Interactive Patient  Education  2017 [employer].

## 2017-02-27 ENCOUNTER — Inpatient Hospital Stay
Admission: EM | Admit: 2017-02-27 | Discharge: 2017-03-06 | DRG: 981 | Disposition: A | Discharge status: Home / Self Care | Payer: MEDICARE | Attending: Internal Medicine | Admitting: Internal Medicine

## 2017-02-27 ENCOUNTER — Emergency Department: Payer: MEDICARE

## 2017-02-27 DIAGNOSIS — R279 Unspecified lack of coordination: Secondary | ICD-10-CM | POA: Diagnosis not present

## 2017-02-27 DIAGNOSIS — I429 Cardiomyopathy, unspecified: Secondary | ICD-10-CM | POA: Diagnosis present

## 2017-02-27 DIAGNOSIS — I2510 Atherosclerotic heart disease of native coronary artery without angina pectoris: Secondary | ICD-10-CM | POA: Diagnosis not present

## 2017-02-27 DIAGNOSIS — I248 Other forms of acute ischemic heart disease: Secondary | ICD-10-CM | POA: Diagnosis present

## 2017-02-27 DIAGNOSIS — Y95 Nosocomial condition: Secondary | ICD-10-CM | POA: Diagnosis present

## 2017-02-27 DIAGNOSIS — I110 Hypertensive heart disease with heart failure: Secondary | ICD-10-CM | POA: Diagnosis not present

## 2017-02-27 DIAGNOSIS — Z7901 Long term (current) use of anticoagulants: Secondary | ICD-10-CM | POA: Diagnosis not present

## 2017-02-27 DIAGNOSIS — I200 Unstable angina: Secondary | ICD-10-CM

## 2017-02-27 DIAGNOSIS — Z853 Personal history of malignant neoplasm of breast: Secondary | ICD-10-CM

## 2017-02-27 DIAGNOSIS — I360 Nonrheumatic tricuspid (valve) stenosis: Secondary | ICD-10-CM | POA: Diagnosis not present

## 2017-02-27 DIAGNOSIS — I4891 Unspecified atrial fibrillation: Secondary | ICD-10-CM | POA: Diagnosis present

## 2017-02-27 DIAGNOSIS — Z801 Family history of malignant neoplasm of trachea, bronchus and lung: Secondary | ICD-10-CM | POA: Diagnosis not present

## 2017-02-27 DIAGNOSIS — E785 Hyperlipidemia, unspecified: Secondary | ICD-10-CM | POA: Diagnosis present

## 2017-02-27 DIAGNOSIS — R54 Age-related physical debility: Secondary | ICD-10-CM | POA: Diagnosis not present

## 2017-02-27 DIAGNOSIS — I5021 Acute systolic (congestive) heart failure: Secondary | ICD-10-CM | POA: Diagnosis not present

## 2017-02-27 DIAGNOSIS — Z90710 Acquired absence of both cervix and uterus: Secondary | ICD-10-CM | POA: Diagnosis not present

## 2017-02-27 DIAGNOSIS — I481 Persistent atrial fibrillation: Secondary | ICD-10-CM | POA: Diagnosis not present

## 2017-02-27 DIAGNOSIS — G4733 Obstructive sleep apnea (adult) (pediatric): Secondary | ICD-10-CM | POA: Diagnosis not present

## 2017-02-27 DIAGNOSIS — J189 Pneumonia, unspecified organism: Principal | ICD-10-CM | POA: Diagnosis present

## 2017-02-27 DIAGNOSIS — E039 Hypothyroidism, unspecified: Secondary | ICD-10-CM | POA: Diagnosis not present

## 2017-02-27 DIAGNOSIS — Z9049 Acquired absence of other specified parts of digestive tract: Secondary | ICD-10-CM

## 2017-02-27 DIAGNOSIS — R748 Abnormal levels of other serum enzymes: Secondary | ICD-10-CM | POA: Diagnosis not present

## 2017-02-27 DIAGNOSIS — N179 Acute kidney failure, unspecified: Secondary | ICD-10-CM | POA: Diagnosis present

## 2017-02-27 DIAGNOSIS — I10 Essential (primary) hypertension: Secondary | ICD-10-CM | POA: Diagnosis not present

## 2017-02-27 DIAGNOSIS — R778 Other specified abnormalities of plasma proteins: Secondary | ICD-10-CM

## 2017-02-27 DIAGNOSIS — R600 Localized edema: Secondary | ICD-10-CM | POA: Diagnosis not present

## 2017-02-27 DIAGNOSIS — Z96659 Presence of unspecified artificial knee joint: Secondary | ICD-10-CM | POA: Diagnosis present

## 2017-02-27 DIAGNOSIS — I509 Heart failure, unspecified: Secondary | ICD-10-CM | POA: Diagnosis not present

## 2017-02-27 DIAGNOSIS — I972 Postmastectomy lymphedema syndrome: Secondary | ICD-10-CM | POA: Diagnosis not present

## 2017-02-27 DIAGNOSIS — I081 Rheumatic disorders of both mitral and tricuspid valves: Secondary | ICD-10-CM | POA: Diagnosis present

## 2017-02-27 DIAGNOSIS — R51 Headache: Secondary | ICD-10-CM | POA: Diagnosis not present

## 2017-02-27 DIAGNOSIS — R079 Chest pain, unspecified: Secondary | ICD-10-CM

## 2017-02-27 DIAGNOSIS — K5900 Constipation, unspecified: Secondary | ICD-10-CM | POA: Diagnosis not present

## 2017-02-27 DIAGNOSIS — I5023 Acute on chronic systolic (congestive) heart failure: Secondary | ICD-10-CM | POA: Diagnosis not present

## 2017-02-27 DIAGNOSIS — Z5189 Encounter for other specified aftercare: Secondary | ICD-10-CM | POA: Diagnosis not present

## 2017-02-27 DIAGNOSIS — R0601 Orthopnea: Secondary | ICD-10-CM | POA: Diagnosis not present

## 2017-02-27 DIAGNOSIS — M6281 Muscle weakness (generalized): Secondary | ICD-10-CM | POA: Diagnosis not present

## 2017-02-27 DIAGNOSIS — R7989 Other specified abnormal findings of blood chemistry: Secondary | ICD-10-CM

## 2017-02-27 DIAGNOSIS — Z803 Family history of malignant neoplasm of breast: Secondary | ICD-10-CM | POA: Diagnosis not present

## 2017-02-27 DIAGNOSIS — R0602 Shortness of breath: Secondary | ICD-10-CM | POA: Diagnosis not present

## 2017-02-27 DIAGNOSIS — I340 Nonrheumatic mitral (valve) insufficiency: Secondary | ICD-10-CM | POA: Diagnosis not present

## 2017-02-27 DIAGNOSIS — I255 Ischemic cardiomyopathy: Secondary | ICD-10-CM | POA: Diagnosis not present

## 2017-02-27 DIAGNOSIS — R262 Difficulty in walking, not elsewhere classified: Secondary | ICD-10-CM | POA: Diagnosis not present

## 2017-02-27 LAB — CBC WITH DIFFERENTIAL/PLATELET

## 2017-02-27 LAB — BASIC METABOLIC PANEL
Anion gap: 10 (ref 5–15)
BUN: 21 mg/dL — ABNORMAL HIGH (ref 6–20)
CALCIUM: 8.9 mg/dL (ref 8.9–10.3)
CO2: 21 mmol/L — AB (ref 22–32)
CREATININE: 1.33 mg/dL — AB (ref 0.44–1.00)
Chloride: 111 mmol/L (ref 101–111)
GFR, EST AFRICAN AMERICAN: 41 mL/min — AB (ref >60)
GFR, EST NON AFRICAN AMERICAN: 35 mL/min — AB (ref >60)
Glucose, Bld: 98 mg/dL (ref 65–99)
Potassium: 3.7 mmol/L (ref 3.5–5.1)
Sodium: 142 mmol/L (ref 135–145)

## 2017-02-27 LAB — CBC
HEMATOCRIT: 34.5 % — AB (ref 35.0–47.0)
HEMOGLOBIN: 11.3 g/dL — AB (ref 12.0–16.0)
MCH: 28.9 pg (ref 26.0–34.0)
MCHC: 32.7 g/dL (ref 32.0–36.0)
MCV: 88.5 fL (ref 80.0–100.0)
Platelets: 393 10*3/uL (ref 150–440)
RBC: 3.9 MIL/uL (ref 3.80–5.20)
RDW: 14.9 % — AB (ref 11.5–14.5)
WBC: 10.8 10*3/uL (ref 3.6–11.0)

## 2017-02-27 LAB — DIFFERENTIAL
BASOS ABS: 0.1 10*3/uL (ref 0–0.1)
Basophils Relative: 1 %
EOS ABS: 0.1 10*3/uL (ref 0–0.7)
Eosinophils Relative: 1 %
LYMPHS ABS: 1.1 10*3/uL (ref 1.0–3.6)
Lymphocytes Relative: 11 %
Monocytes Absolute: 1 10*3/uL — ABNORMAL HIGH (ref 0.2–0.9)
Monocytes Relative: 9 %
NEUTROS PCT: 78 %
Neutro Abs: 8.3 10*3/uL — ABNORMAL HIGH (ref 1.4–6.5)

## 2017-02-27 LAB — BRAIN NATRIURETIC PEPTIDE: B NATRIURETIC PEPTIDE 5: 1190 pg/mL — AB (ref 0.0–100.0)

## 2017-02-27 LAB — LACTIC ACID, PLASMA: Lactic Acid, Venous: 1.2 mmol/L (ref 0.5–1.9)

## 2017-02-27 LAB — TROPONIN I: Troponin I: 0.03 ng/mL (ref <0.03)

## 2017-02-27 MED ORDER — IPRATROPIUM-ALBUTEROL 0.5-2.5 (3) MG/3ML IN SOLN
3.0000 mL | Freq: Once | RESPIRATORY_TRACT | Status: AC
  Administered 2017-02-27: 3 mL via RESPIRATORY_TRACT
  Filled 2017-02-27: qty 3

## 2017-02-27 MED ORDER — FUROSEMIDE 10 MG/ML IJ SOLN
40.0000 mg | Freq: Once | INTRAMUSCULAR | Status: AC
  Administered 2017-02-27: 40 mg via INTRAVENOUS
  Filled 2017-02-27: qty 4

## 2017-02-27 MED ORDER — ALBUTEROL SULFATE (2.5 MG/3ML) 0.083% IN NEBU
5.0000 mg | INHALATION_SOLUTION | Freq: Once | RESPIRATORY_TRACT | Status: AC
  Administered 2017-02-27: 5 mg via RESPIRATORY_TRACT
  Filled 2017-02-27: qty 6

## 2017-02-27 MED ORDER — DEXTROSE 5 % IV SOLN
2.0000 g | Freq: Once | INTRAVENOUS | Status: AC
  Administered 2017-02-27: 2 g via INTRAVENOUS
  Filled 2017-02-27 (×3): qty 2

## 2017-02-27 MED ORDER — VANCOMYCIN HCL 10 G IV SOLR
1250.0000 mg | Freq: Once | INTRAVENOUS | Status: AC
  Administered 2017-02-27: 1250 mg via INTRAVENOUS
  Filled 2017-02-27: qty 1250

## 2017-02-27 MED ORDER — VANCOMYCIN HCL 10 G IV SOLR
1250.0000 mg | INTRAVENOUS | Status: DC
  Filled 2017-02-27: qty 1250

## 2017-02-27 MED ORDER — DEXTROSE 5 % IV SOLN
2.0000 g | Freq: Two times a day (BID) | INTRAVENOUS | Status: DC
  Administered 2017-02-28 – 2017-03-01 (×3): 2 g via INTRAVENOUS
  Filled 2017-02-27 (×4): qty 2

## 2017-02-27 NOTE — ED Notes (Signed)
IV attempt x2 unsuccessful, charge RN notified, will try to start IV on pt.

## 2017-02-27 NOTE — ED Notes (Signed)
MD Jannifer Franklin notified of pt's BP and HR, see MAR for follow up.

## 2017-02-27 NOTE — ED Triage Notes (Signed)
Patient has recent dx of Pneumonia, on Levoquin for 3 days. Patient has chest tightness, SOB, N/V anorexia, and weakness.

## 2017-02-27 NOTE — H&P (Signed)
[facility] [place] NAME: Stephanie Baskins    MR#:  [id number]  DATE OF BIRTH:  [birth year]  DATE OF ADMISSION:  02/27/2017  PRIMARY CARE PHYSICIAN: Coral Spikes, DO   REQUESTING/REFERRING PHYSICIAN: Quentin Cornwall, MD  CHIEF COMPLAINT:   Chief Complaint  Patient presents with  . Shortness of Breath  . Emesis    HISTORY OF PRESENT ILLNESS:  Stephanie Contreras  is a 83 y.o. female who presents with nausea, vomiting, shortness of breath.  Patient has had lower extremity edema for some time, has had some progressive orthopnea.  She recently saw her cardiologist and was scheduled to have an echocardiogram in June. She is also recently started on outpatient antibiotics for community acquired pneumonia. Today she had such a significant increase in her shortness of breath that she came to the ED. Here she was found to have BNP greater than thousand, chest x-ray looks like multifocal pneumonia, we'll also likely with some superimposed edema from CHF. She was recently diagnosed with A. fib and started on eliquis.  Hospitalists were called for admission  PAST MEDICAL HISTORY:   Past Medical History:  Diagnosis Date  . Atrial fibrillation ([place])   . Breast cancer ([place])    Right  . Essential hypertension 01/16/2017  . Frequent headaches   . Hyperlipidemia 01/16/2017  . Hypothyroidism 01/16/2017  . Kidney stones   . Lymphedema 01/16/2017    PAST SURGICAL HISTORY:   Past Surgical History:  Procedure Laterality Date  . ABDOMINAL HYSTERECTOMY    . BACK SURGERY    . BREAST LUMPECTOMY Right   . CHOLECYSTECTOMY    . REPLACEMENT TOTAL KNEE    . TONSILLECTOMY      SOCIAL HISTORY:   Social History  Substance Use Topics  . Smoking status: Never Smoker  . Smokeless tobacco: Never Used  . Alcohol use No    FAMILY HISTORY:   Family History  Problem Relation Age of Onset  . Breast cancer Sister   . Lung cancer Sister     DRUG ALLERGIES:  No Known  Allergies  MEDICATIONS AT HOME:   Prior to Admission medications   Medication Sig Start Date End Date Taking? Authorizing Provider  apixaban (ELIQUIS) 5 MG TABS tablet Take 1 tablet (5 mg total) by mouth 2 (two) times daily. 02/26/17  Yes Wende Bushy, MD  Calcium Carb-Cholecalciferol (CALTRATE 600+D3 SOFT PO) Take by mouth 3 (three) times daily before meals.   Yes Historical Provider, MD  furosemide (LASIX) 40 MG tablet Take 40 mg by mouth daily.   Yes Historical Provider, MD  levofloxacin (LEVAQUIN) 750 MG tablet Take 1 tablet (750 mg total) by mouth daily. 02/24/17 03/03/17 Yes Nance Pear, MD  levothyroxine (SYNTHROID, LEVOTHROID) 125 MCG tablet Take 125 mcg by mouth daily.   Yes Historical Provider, MD  Multiple Vitamins-Minerals (EYE VITAMINS PO) Take by mouth 2 (two) times daily.   Yes Historical Provider, MD  Multiple Vitamins-Minerals (MULTIVITAMIN ADULT PO) Take by mouth. Centrum silver   Yes Historical Provider, MD  OVER THE COUNTER MEDICATION combrigan eye drops. 1 drop in each eye BID.   Yes Historical Provider, MD  potassium chloride SA (K-DUR,KLOR-CON) 20 MEQ tablet Take 2 tablets (40 mEq total) by mouth daily. 01/20/17  Yes Coral Spikes, DO  simvastatin (ZOCOR) 40 MG tablet Take 40 mg by mouth daily.   Yes Historical Provider, MD    REVIEW OF SYSTEMS:  Review of Systems  Constitutional: Positive  for malaise/fatigue. Negative for chills, fever and weight loss.  HENT: Negative for ear pain, hearing loss and tinnitus.   Eyes: Negative for blurred vision, double vision, pain and redness.  Respiratory: Positive for cough and shortness of breath. Negative for hemoptysis.   Cardiovascular: Positive for chest pain, orthopnea and leg swelling. Negative for palpitations.  Gastrointestinal: Positive for nausea. Negative for abdominal pain, constipation, diarrhea and vomiting.  Genitourinary: Negative for dysuria, frequency and hematuria.  Musculoskeletal: Negative for back pain, joint  pain and neck pain.  Skin:       No acne, rash, or lesions  Neurological: Negative for dizziness, tremors, focal weakness and weakness.  Endo/Heme/Allergies: Negative for polydipsia. Does not bruise/bleed easily.  Psychiatric/Behavioral: Negative for depression. The patient is not nervous/anxious and does not have insomnia.      VITAL SIGNS:   Vitals:   02/27/17 2027 02/27/17 2030 02/27/17 2228  BP:  (!) 134/45 (!) 139/101  Pulse:  (!) 139 (!) 104  Resp:  (!) 24 (!) 34  Temp:  97.7 F (36.5 C)   TempSrc:  Oral   SpO2:  97% 95%  Weight: 84.4 kg (186 lb)    Height: 5\' 9"  (1.753 m)     Wt Readings from Last 3 Encounters:  02/27/17 84.4 kg (186 lb)  02/26/17 84.4 kg (186 lb)  02/24/17 81.6 kg (180 lb)    PHYSICAL EXAMINATION:  Physical Exam  Vitals reviewed. Constitutional: She is oriented to person, place, and time. She appears well-developed and well-nourished. No distress.  HENT:  Head: Normocephalic and atraumatic.  Mouth/Throat: Oropharynx is clear and moist.  Eyes: Conjunctivae and EOM are normal. Pupils are equal, round, and reactive to light. No scleral icterus.  Neck: Normal range of motion. Neck supple. No JVD present. No thyromegaly present.  Cardiovascular: Normal rate, regular rhythm and intact distal pulses.  Exam reveals no gallop and no friction rub.   No murmur heard. tachycardic  Respiratory: She is in respiratory distress. She has no wheezes. She has rales.  ronchi  GI: Soft. Bowel sounds are normal. She exhibits no distension. There is no tenderness.  Musculoskeletal: Normal range of motion. She exhibits edema.  No arthritis, no gout  Lymphadenopathy:    She has no cervical adenopathy.  Neurological: She is alert and oriented to person, place, and time. No cranial nerve deficit.  No dysarthria, no aphasia  Skin: Skin is warm and dry. No rash noted. No erythema.  Psychiatric: She has a normal mood and affect. Her behavior is normal. Judgment and  thought content normal.    LABORATORY PANEL:   CBC  Recent Labs Lab 96/22/29 7989  WBC DUPLICATE  21.1  HGB DUPLICATE  94.1*  HCT DUPLICATE  74.0*  PLT DUPLICATE  814   ------------------------------------------------------------------------------------------------------------------  Chemistries   Recent Labs Lab 02/27/17 2059  NA 142  K 3.7  CL 111  CO2 21*  GLUCOSE 98  BUN 21*  CREATININE 1.33*  CALCIUM 8.9   ------------------------------------------------------------------------------------------------------------------  Cardiac Enzymes  Recent Labs Lab 02/27/17 2059  TROPONINI 0.03*   ------------------------------------------------------------------------------------------------------------------  RADIOLOGY:  Dg Chest 2 View  Result Date: 02/27/2017 CLINICAL DATA:  Shortness of breath EXAM: CHEST  2 VIEW COMPARISON:  02/24/2017 FINDINGS: Mild cardiac enlargement. Aortic atherosclerosis noted. Bilateral pleural effusions are noted. Bilateral lower lobe airspace opacities are again noted compatible with multifocal pneumonia. IMPRESSION: 1. Persistent bilateral airspace opacities compatible with multifocal pneumonia. 2. Bilateral pleural effusions Electronically Signed   By: Kerby Moors  M.D.   On: 02/27/2017 20:58    EKG:   Orders placed or performed during the hospital encounter of 02/27/17  . ED EKG within 10 minutes  . ED EKG within 10 minutes  . EKG 12-Lead  . EKG 12-Lead    IMPRESSION AND PLAN:  Principal Problem:   CAP (community acquired pneumonia) - IV antibiotics, when necessary supportive medications Active Problems:   Acute systolic CHF (congestive heart failure) (HCC) - this is a new diagnosis, with a BNP greater than 1000. We will order an echocardiogram and a cardiology consult for the morning   Essential hypertension - elevated, continue home meds, use additional when necessary antihypertensives   Atrial fibrillation ([place]) -  continue home rate controlling medications and anticoagulation   Hyperlipidemia - continue home meds   Hypothyroidism - home dose thyroid replacement  All the records are reviewed and case discussed with ED provider. Management plans discussed with the patient and/or family.  DVT PROPHYLAXIS: Systemic anticoagulation  GI PROPHYLAXIS: None  ADMISSION STATUS: Inpatient  CODE STATUS: Full Code Status History    This patient does not have a recorded code status. Please follow your organizational policy for patients in this situation.    Advance Directive Documentation     Most Recent Value  Type of Advance Directive  Healthcare Power of Attorney, Living will  Pre-existing out of facility DNR order (yellow form or pink MOST form)  -  "MOST" Form in Place?  -      TOTAL TIME TAKING CARE OF THIS PATIENT: 45 minutes.   Mare Ludtke [place] 02/27/2017, 10:59 PM  [facility]  Office  [phone]  CC: Primary care physician; Coral Spikes, DO  Note:  This document was prepared using Dragon voice recognition software and may include unintentional dictation errors.

## 2017-02-27 NOTE — ED Provider Notes (Signed)
[facility] Emergency Department Provider Note    First MD Initiated Contact with Patient 02/27/17 2034     (approximate)  I have reviewed the triage vital signs and the nursing notes.   HISTORY  Chief Complaint Shortness of Breath and Emesis    HPI Stephanie Contreras is a 83 y.o. female presents with chief complaint of 3 days of worsening chest tightness, shortness of breath nausea vomiting and decreased oral intake. Patient was seen here 3 days ago and found to have multifocal pneumonia was started on antibiotics and she was not hypoxic. Since being discharged home feels that her symptoms have progressively worsened. States that she is also having worsening orthopnea. Denies any significant chest pain. Just feels that she's having trouble catching her breath.   Past Medical History:  Diagnosis Date  . Atrial fibrillation ([place])   . Breast cancer ([place])    Right  . Essential hypertension 01/16/2017  . Frequent headaches   . Hyperlipidemia 01/16/2017  . Hypothyroidism 01/16/2017  . Kidney stones   . Lymphedema 01/16/2017   Family History  Problem Relation Age of Onset  . Breast cancer Sister   . Lung cancer Sister    Past Surgical History:  Procedure Laterality Date  . ABDOMINAL HYSTERECTOMY    . BACK SURGERY    . BREAST LUMPECTOMY Right   . CHOLECYSTECTOMY    . REPLACEMENT TOTAL KNEE    . TONSILLECTOMY     Patient Active Problem List   Diagnosis Date Noted  . OSA (obstructive sleep apnea) 02/16/2017  . Hypercalcemia 02/10/2017  . History of breast cancer 01/16/2017  . Lymphedema 01/16/2017  . Essential hypertension 01/16/2017  . Hyperlipidemia 01/16/2017  . Hypothyroidism 01/16/2017  . Venous stasis 01/16/2017  . Glaucoma 01/16/2017  . New onset atrial fibrillation ([place]) 01/16/2017      Prior to Admission medications   Medication Sig Start Date End Date Taking? Authorizing Provider  apixaban (ELIQUIS) 5 MG TABS tablet Take 1 tablet (5  mg total) by mouth 2 (two) times daily. 02/26/17  Yes Wende Bushy, MD  Calcium Carb-Cholecalciferol (CALTRATE 600+D3 SOFT PO) Take by mouth 3 (three) times daily before meals.   Yes Historical Provider, MD  furosemide (LASIX) 40 MG tablet Take 40 mg by mouth daily.   Yes Historical Provider, MD  levofloxacin (LEVAQUIN) 750 MG tablet Take 1 tablet (750 mg total) by mouth daily. 02/24/17 03/03/17 Yes Nance Pear, MD  levothyroxine (SYNTHROID, LEVOTHROID) 125 MCG tablet Take 125 mcg by mouth daily.   Yes Historical Provider, MD  Multiple Vitamins-Minerals (EYE VITAMINS PO) Take by mouth 2 (two) times daily.   Yes Historical Provider, MD  Multiple Vitamins-Minerals (MULTIVITAMIN ADULT PO) Take by mouth. Centrum silver   Yes Historical Provider, MD  OVER THE COUNTER MEDICATION combrigan eye drops. 1 drop in each eye BID.   Yes Historical Provider, MD  potassium chloride SA (K-DUR,KLOR-CON) 20 MEQ tablet Take 2 tablets (40 mEq total) by mouth daily. 01/20/17  Yes Coral Spikes, DO  simvastatin (ZOCOR) 40 MG tablet Take 40 mg by mouth daily.   Yes Historical Provider, MD    Allergies Patient has no known allergies.    Social History Social History  Substance Use Topics  . Smoking status: Never Smoker  . Smokeless tobacco: Never Used  . Alcohol use No    Review of Systems Patient denies headaches, rhinorrhea, blurry vision, numbness, shortness of breath, chest pain, edema, cough, abdominal pain, nausea, vomiting, diarrhea,  dysuria, fevers, rashes or hallucinations unless otherwise stated above in HPI. ____________________________________________   PHYSICAL EXAM:  VITAL SIGNS: Vitals:   02/27/17 2030 02/27/17 2228  BP: (!) 134/45 (!) 139/101  Pulse: (!) 139 (!) 104  Resp: (!) 24 (!) 34  Temp: 97.7 F (36.5 C)     Constitutional: Alert and oriented.ill appearing in moderate respiratory distress Eyes: Conjunctivae are normal. PERRL. EOMI. Head: Atraumatic. Nose: No  congestion/rhinnorhea. Mouth/Throat: Mucous membranes are moist.  Oropharynx non-erythematous. Neck: No stridor. Painless ROM. No cervical spine tenderness to palpation Hematological/Lymphatic/Immunilogical: No cervical lymphadenopathy. Cardiovascular: Normal rate, irregular rhythm. Grossly normal heart sounds.  Good peripheral circulation. Respiratory: tachypneic, use of accessory muscles, inspiratory crackles in posterior bases, intermittent wheeze Gastrointestinal: Soft and nontender. No distention. No abdominal bruits. No CVA tenderness. Musculoskeletal: No lower extremity tenderness 2+  edema.  No joint effusions. Neurologic:  Normal speech and language. No gross focal neurologic deficits are appreciated. No gait instability. Skin:  Skin is warm, dry and intact. No rash noted. Psychiatric: Mood and affect are normal. Speech and behavior are normal.  ____________________________________________   LABS (all labs ordered are listed, but only abnormal results are displayed)  Results for orders placed or performed during the hospital encounter of 02/27/17 (from the past 24 hour(s))  Basic metabolic panel     Status: Abnormal   Collection Time: 02/27/17  8:59 PM  Result Value Ref Range   Sodium 142 135 - 145 mmol/L   Potassium 3.7 3.5 - 5.1 mmol/L   Chloride 111 101 - 111 mmol/L   CO2 21 (L) 22 - 32 mmol/L   Glucose, Bld 98 65 - 99 mg/dL   BUN 21 (H) 6 - 20 mg/dL   Creatinine, Ser 1.33 (H) 0.44 - 1.00 mg/dL   Calcium 8.9 8.9 - 10.3 mg/dL   GFR calc non Af Amer 35 (L) >60 mL/min   GFR calc Af Amer 41 (L) >60 mL/min   Anion gap 10 5 - 15  Troponin I     Status: Abnormal   Collection Time: 02/27/17  8:59 PM  Result Value Ref Range   Troponin I 0.03 (HH) <0.03 ng/mL  Lactic acid, plasma     Status: None   Collection Time: 02/27/17  9:22 PM  Result Value Ref Range   Lactic Acid, Venous 1.2 0.5 - 1.9 mmol/L  CBC     Status: Abnormal   Collection Time: 02/27/17  9:22 PM  Result  Value Ref Range   WBC 10.8 3.6 - 11.0 K/uL   RBC 3.90 3.80 - 5.20 MIL/uL   Hemoglobin 11.3 (L) 12.0 - 16.0 g/dL   HCT 34.5 (L) 35.0 - 47.0 %   MCV 88.5 80.0 - 100.0 fL   MCH 28.9 26.0 - 34.0 pg   MCHC 32.7 32.0 - 36.0 g/dL   RDW 14.9 (H) 11.5 - 14.5 %   Platelets 393 150 - 440 K/uL  CBC with Differential/Platelet     Status: None (Preliminary result)   Collection Time: 02/27/17  9:22 PM  Result Value Ref Range   WBC DUPLICATE 4.0 - 84.5 K/uL   RBC DUPLICATE 3.64 - 6.80 MIL/uL   Hemoglobin DUPLICATE 32.1 - 22.4 g/dL   HCT DUPLICATE 82.5 - 00.3 %   MCV DUPLICATE 70.4 - 888.9 fL   MCH DUPLICATE 16.9 - 45.0 pg   MCHC DUPLICATE 38.8 - 82.8 g/dL   RDW DUPLICATE 00.3 - 49.1 %   Platelets DUPLICATE 791 - 505 K/uL  Neutrophils Relative % PENDING %   Neutro Abs PENDING 1.7 - 7.7 K/uL   Band Neutrophils PENDING %   Lymphocytes Relative PENDING %   Lymphs Abs PENDING 0.7 - 4.0 K/uL   Monocytes Relative PENDING %   Monocytes Absolute PENDING 0.1 - 1.0 K/uL   Eosinophils Relative PENDING %   Eosinophils Absolute PENDING 0.0 - 0.7 K/uL   Basophils Relative PENDING %   Basophils Absolute PENDING 0.0 - 0.1 K/uL   WBC Morphology PENDING    RBC Morphology PENDING    Smear Review PENDING    Other PENDING %   nRBC PENDING 0 /100 WBC   Metamyelocytes Relative PENDING %   Myelocytes PENDING %   Promyelocytes Absolute PENDING %   Blasts PENDING %  Differential     Status: Abnormal   Collection Time: 02/27/17  9:22 PM  Result Value Ref Range   Neutrophils Relative % 78 %   Neutro Abs 8.3 (H) 1.4 - 6.5 K/uL   Lymphocytes Relative 11 %   Lymphs Abs 1.1 1.0 - 3.6 K/uL   Monocytes Relative 9 %   Monocytes Absolute 1.0 (H) 0.2 - 0.9 K/uL   Eosinophils Relative 1 %   Eosinophils Absolute 0.1 0 - 0.7 K/uL   Basophils Relative 1 %   Basophils Absolute 0.1 0 - 0.1 K/uL   ____________________________________________  EKG My review and personal interpretation at Time: 20:27   Indication: SOB   Rate: 140  Rhythm: afib with rvr Axis: normal Other: non specific st changes likely rate dependent ____________________________________________  RADIOLOGY  I personally reviewed all radiographic images ordered to evaluate for the above acute complaints and reviewed radiology reports and findings.  These findings were personally discussed with the patient.  Please see medical record for radiology report.  ____________________________________________   PROCEDURES  Procedure(s) performed:  Procedures    Critical Care performed: no CRITICAL CARE ____________________________________________   INITIAL IMPRESSION / ASSESSMENT AND PLAN / ED COURSE  Pertinent labs & imaging results that were available during my care of the patient were reviewed by me and considered in my medical decision making (see chart for details).  DDX: Asthma, copd, CHF, pna, ptx, malignancy, Pe, anemia   Stephanie K Banfield is a 20 y.o. who presents to the ED with symptoms as described above. Patient is in moderate respiratory distress but currently protecting her airway. Patient given multiple nebulizer treatments with some improvement. Patient with a bit of a mixed picture as she was partially treated with antibiotics. Chest x-ray shows persistent patchy airspace opacities but clinically the patient also appears to have some component of underlying congestive heart failure. ENT is elevated. Patient also in A. fib with RVR but normotensive at this time. RVR is likely secondary to nebulizer treatments. Do not want to provide IV fluids at this time and she is otherwise normotensive. We will broaden her antibiotic coverage for healthcare associated pneumonia at this time. Based on her acute tachypnea and respiratory symptoms requiring multiple nebulizer treatments do feel patient will require admission to the hospital for further evaluation and management.      ____________________________________________   FINAL  CLINICAL IMPRESSION(S) / ED DIAGNOSES  Final diagnoses:  HCAP (healthcare-associated pneumonia)  Acute congestive heart failure, unspecified heart failure type ([place])  Multifocal pneumonia      NEW MEDICATIONS STARTED DURING THIS VISIT:  New Prescriptions   No medications on file     Note:  This document was prepared using Dragon voice recognition software  and may include unintentional dictation errors.    Merlyn Lot, MD 02/27/17 [phone]

## 2017-02-27 NOTE — Progress Notes (Addendum)
Pharmacy Antibiotic Note  Stephanie Contreras is a 83 y.o. female admitted on 02/27/2017 with sepsis.  Pharmacy has been consulted for vancomycin and cefepime dosing.  Plan: DW 84kg  Vd 59L kei 0.034 hr-1  t1/2 20 hours Vancomycin 1250 mg q 24 hours ordered with stacked dosing. Level before 5th dose. Goal trough 15-20.  Cefepime 2 grams q 12 hours ordered.  Height: 5\' 9"  (175.3 cm) Weight: 186 lb (84.4 kg) IBW/kg (Calculated) : 66.2  Temp (24hrs), Avg:97.7 F (36.5 C), Min:97.7 F (36.5 C), Max:97.7 F (36.5 C)   Recent Labs Lab 02/24/17 1640 02/27/17 2059 02/27/17 2122  WBC 93.2  --  DUPLICATE  67.1  CREATININE 0.96 1.33*  --   LATICACIDVEN  --   --  1.2    Estimated Creatinine Clearance: 35.9 mL/min (A) (by C-G formula based on SCr of 1.33 mg/dL (H)).    No Known Allergies  Antimicrobials this admission: vancomycin cefepime 5/4 >>    >>   Dose adjustments this admission:   Microbiology results: 5/4 BCx: pending 5/4 UCx: pending       5/4 UA: pending 5/4 CXR: bilateral opacities, multifocal pneumonia. Thank you for allowing pharmacy to be a part of this patient's care.  Shikira Folino S 02/27/2017 11:08 PM

## 2017-02-28 ENCOUNTER — Inpatient Hospital Stay (HOSPITAL_COMMUNITY)
Admit: 2017-02-28 | Discharge: 2017-02-28 | Disposition: A | Discharge status: Home / Self Care | Payer: MEDICARE | Attending: Internal Medicine | Admitting: Internal Medicine

## 2017-02-28 DIAGNOSIS — I481 Persistent atrial fibrillation: Secondary | ICD-10-CM

## 2017-02-28 DIAGNOSIS — I340 Nonrheumatic mitral (valve) insufficiency: Secondary | ICD-10-CM

## 2017-02-28 DIAGNOSIS — I360 Nonrheumatic tricuspid (valve) stenosis: Secondary | ICD-10-CM

## 2017-02-28 DIAGNOSIS — E785 Hyperlipidemia, unspecified: Secondary | ICD-10-CM

## 2017-02-28 DIAGNOSIS — I5021 Acute systolic (congestive) heart failure: Secondary | ICD-10-CM

## 2017-02-28 DIAGNOSIS — I10 Essential (primary) hypertension: Secondary | ICD-10-CM

## 2017-02-28 LAB — CBC
HCT: 31.2 % — ABNORMAL LOW (ref 35.0–47.0)
Hemoglobin: 10.5 g/dL — ABNORMAL LOW (ref 12.0–16.0)
MCH: 30 pg (ref 26.0–34.0)
MCHC: 33.6 g/dL (ref 32.0–36.0)
MCV: 89.2 fL (ref 80.0–100.0)
PLATELETS: 310 10*3/uL (ref 150–440)
RBC: 3.5 MIL/uL — AB (ref 3.80–5.20)
RDW: 15 % — ABNORMAL HIGH (ref 11.5–14.5)
WBC: 9.9 10*3/uL (ref 3.6–11.0)

## 2017-02-28 LAB — BASIC METABOLIC PANEL
Anion gap: 9 (ref 5–15)
BUN: 19 mg/dL (ref 6–20)
CHLORIDE: 113 mmol/L — AB (ref 101–111)
CO2: 20 mmol/L — AB (ref 22–32)
CREATININE: 1.05 mg/dL — AB (ref 0.44–1.00)
Calcium: 8.3 mg/dL — ABNORMAL LOW (ref 8.9–10.3)
GFR calc non Af Amer: 47 mL/min — ABNORMAL LOW (ref >60)
GFR, EST AFRICAN AMERICAN: 55 mL/min — AB (ref >60)
Glucose, Bld: 89 mg/dL (ref 65–99)
Potassium: 3.5 mmol/L (ref 3.5–5.1)
Sodium: 142 mmol/L (ref 135–145)

## 2017-02-28 LAB — ECHOCARDIOGRAM COMPLETE
HEIGHTINCHES: 67 in
Weight: 2956.8 oz

## 2017-02-28 LAB — URINALYSIS, COMPLETE (UACMP) WITH MICROSCOPIC
Bilirubin Urine: NEGATIVE
GLUCOSE, UA: NEGATIVE mg/dL
Hgb urine dipstick: NEGATIVE
KETONES UR: NEGATIVE mg/dL
Leukocytes, UA: NEGATIVE
NITRITE: NEGATIVE
PROTEIN: 30 mg/dL — AB
Specific Gravity, Urine: 1.009 (ref 1.005–1.030)
Squamous Epithelial / LPF: NONE SEEN
pH: 5 (ref 5.0–8.0)

## 2017-02-28 LAB — TROPONIN I
TROPONIN I: 0.03 ng/mL — AB (ref <0.03)
TROPONIN I: 0.06 ng/mL — AB (ref <0.03)
Troponin I: <0.03 ng/mL (ref <0.03)

## 2017-02-28 MED ORDER — APIXABAN 5 MG PO TABS
5.0000 mg | ORAL_TABLET | Freq: Two times a day (BID) | ORAL | Status: DC
  Administered 2017-02-28 – 2017-03-03 (×7): 5 mg via ORAL
  Filled 2017-02-28 (×7): qty 1

## 2017-02-28 MED ORDER — VANCOMYCIN HCL 10 G IV SOLR
1250.0000 mg | INTRAVENOUS | Status: DC
  Administered 2017-02-28: 1250 mg via INTRAVENOUS
  Filled 2017-02-28 (×2): qty 1250

## 2017-02-28 MED ORDER — ONDANSETRON HCL 4 MG PO TABS: 4.0000 mg | ORAL_TABLET | Freq: Four times a day (QID) | ORAL | Status: DC | PRN

## 2017-02-28 MED ORDER — FUROSEMIDE 40 MG PO TABS
40.0000 mg | ORAL_TABLET | Freq: Every day | ORAL | Status: DC
  Filled 2017-02-28: qty 1

## 2017-02-28 MED ORDER — SENNA 8.6 MG PO TABS
1.0000 | ORAL_TABLET | Freq: Every day | ORAL | Status: DC | PRN
  Administered 2017-02-28 – 2017-03-03 (×2): 8.6 mg via ORAL
  Filled 2017-02-28 (×2): qty 1

## 2017-02-28 MED ORDER — LABETALOL HCL 5 MG/ML IV SOLN
10.0000 mg | INTRAVENOUS | Status: DC | PRN
  Administered 2017-02-28: 10 mg via INTRAVENOUS
  Filled 2017-02-28: qty 4

## 2017-02-28 MED ORDER — FUROSEMIDE 10 MG/ML IJ SOLN
40.0000 mg | Freq: Once | INTRAMUSCULAR | Status: AC
  Administered 2017-02-28: 40 mg via INTRAVENOUS
  Filled 2017-02-28: qty 4

## 2017-02-28 MED ORDER — CARVEDILOL 3.125 MG PO TABS
3.1250 mg | ORAL_TABLET | Freq: Two times a day (BID) | ORAL | Status: DC
  Administered 2017-03-01 (×2): 3.125 mg via ORAL
  Filled 2017-02-28 (×2): qty 1

## 2017-02-28 MED ORDER — ONDANSETRON HCL 4 MG/2ML IJ SOLN
4.0000 mg | Freq: Four times a day (QID) | INTRAMUSCULAR | Status: DC | PRN
  Administered 2017-02-28: 4 mg via INTRAVENOUS
  Filled 2017-02-28: qty 2

## 2017-02-28 MED ORDER — IPRATROPIUM-ALBUTEROL 0.5-2.5 (3) MG/3ML IN SOLN
3.0000 mL | RESPIRATORY_TRACT | Status: DC | PRN
Start: 2017-02-28 — End: 2017-03-06

## 2017-02-28 MED ORDER — ACETAMINOPHEN 650 MG RE SUPP: 650.0000 mg | Freq: Four times a day (QID) | RECTAL | Status: DC | PRN

## 2017-02-28 MED ORDER — ACETAMINOPHEN 325 MG PO TABS
650.0000 mg | ORAL_TABLET | Freq: Four times a day (QID) | ORAL | Status: DC | PRN
Start: 2017-02-28 — End: 2017-03-06

## 2017-02-28 MED ORDER — BRIMONIDINE TARTRATE 0.2 % OP SOLN
1.0000 [drp] | Freq: Two times a day (BID) | OPHTHALMIC | Status: DC
  Administered 2017-02-28 – 2017-03-06 (×13): 1 [drp] via OPHTHALMIC
  Filled 2017-02-28: qty 5

## 2017-02-28 MED ORDER — BENZONATATE 100 MG PO CAPS: 200.0000 mg | ORAL_CAPSULE | Freq: Three times a day (TID) | ORAL | Status: DC | PRN

## 2017-02-28 MED ORDER — POTASSIUM CHLORIDE CRYS ER 20 MEQ PO TBCR
40.0000 meq | EXTENDED_RELEASE_TABLET | Freq: Once | ORAL | Status: AC
  Administered 2017-02-28: 40 meq via ORAL
  Filled 2017-02-28: qty 2

## 2017-02-28 MED ORDER — GUAIFENESIN-DM 100-10 MG/5ML PO SYRP: 5.0000 mL | ORAL_SOLUTION | ORAL | Status: DC | PRN

## 2017-02-28 MED ORDER — SIMVASTATIN 40 MG PO TABS
40.0000 mg | ORAL_TABLET | Freq: Every day | ORAL | Status: DC
  Administered 2017-02-28 – 2017-03-06 (×6): 40 mg via ORAL
  Filled 2017-02-28 (×6): qty 1

## 2017-02-28 MED ORDER — KETOROLAC TROMETHAMINE 10 MG PO TABS
10.0000 mg | ORAL_TABLET | Freq: Three times a day (TID) | ORAL | Status: DC | PRN
  Administered 2017-02-28: 10 mg via ORAL
  Filled 2017-02-28 (×3): qty 1

## 2017-02-28 MED ORDER — LEVOTHYROXINE SODIUM 25 MCG PO TABS
125.0000 ug | ORAL_TABLET | Freq: Every day | ORAL | Status: DC
  Administered 2017-02-28 – 2017-03-06 (×7): 125 ug via ORAL
  Filled 2017-02-28 (×7): qty 1

## 2017-02-28 NOTE — ED Notes (Signed)
Pt reports she needs to use the bathroom to void, pt placed on bedpan however after 3 minutes pt states she is not able to use bedpan. Pt then placed on bedside commode with assistance. At this time pt is trying to use the BR.

## 2017-02-28 NOTE — Progress Notes (Signed)
Pt arrived from ED alert and oriented. Pt is extremely HOH, especially on the left. Granddaughter at bedside. No c/o pain, no SOB. VSS. Fall Neurosurgeon. Left leg extremely edematous, pt's PCP aware. No concerns offered at this time.

## 2017-02-28 NOTE — Progress Notes (Signed)
*  PRELIMINARY RESULTS* Echocardiogram 2D Echocardiogram has been performed.  Lavell Luster Anes Rigel 02/28/2017, 9:20 AM

## 2017-02-28 NOTE — Plan of Care (Signed)
Problem: Safety: Goal: Ability to remain free from injury will improve Outcome: Progressing Pt encouraged to call out for assistance with activity,

## 2017-02-28 NOTE — ED Notes (Signed)
Pt unsuccessful using BSC.

## 2017-02-28 NOTE — Consult Note (Signed)
Cardiology Consultation   Patient ID: Stephanie K Pakula; [id number]; July 11, 1929   Admit date: 02/27/2017 Date of Consult: 02/28/2017  Referring MD: Dr. Anselm Jungling  Patient Care Team: Coral Spikes, DO as PCP - General (Family Medicine)    Reason for Consultation: CHF   History of Present Illness: Stephanie Contreras is a 83 y.o. female with a hx of HTN, recently diagnosed with Afib  Pt seen in office 02/26/2017: Patient reports that she has not been aware of any rhythm change. No palpitations. She denies chest pains. No shortness of breath prior to this episode of pneumonia.  She uses a walker mainly to assist with walking. She moved recently from [place]. Since August of last year she had some issues with being off balance and suffering from minor falls 3 times. Since the last time in February, she and her family had made a conscious effort to prevent any more falls. She now consistently uses her walker. She avoids uneven pavement. There is always someone from her family with her at home. She is very conscious about making sure she avoid any more falls.  Since then, patient noted worsening of the shortness of breath. Also complained of chest pressure moderate intensity, on and off, unclear to patient whether she's been having this even before date of admission. No palpitations. So denies PND and orthopnea. Edema has persisted.   ROS:  Please see the history of present illness.  ROS All other ROS reviewed and negative.    Past Medical History:  Diagnosis Date  . Atrial fibrillation ([place])   . Breast cancer ([place])    Right  . Essential hypertension 01/16/2017  . Frequent headaches   . Hyperlipidemia 01/16/2017  . Hypothyroidism 01/16/2017  . Kidney stones   . Lymphedema 01/16/2017    Past Surgical History:  Procedure Laterality Date  . ABDOMINAL HYSTERECTOMY    . BACK SURGERY    . BREAST LUMPECTOMY Right   . CHOLECYSTECTOMY    . REPLACEMENT TOTAL KNEE    . TONSILLECTOMY         Home Meds: Prior to Admission medications   Medication Sig Start Date End Date Taking? Authorizing Provider  apixaban (ELIQUIS) 5 MG TABS tablet Take 1 tablet (5 mg total) by mouth 2 (two) times daily. 02/26/17  Yes Wende Bushy, MD  Calcium Carb-Cholecalciferol (CALTRATE 600+D3 SOFT PO) Take by mouth 3 (three) times daily before meals.   Yes [provider]  furosemide (LASIX) 40 MG tablet Take 40 mg by mouth daily.   Yes [provider]  levofloxacin (LEVAQUIN) 750 MG tablet Take 1 tablet (750 mg total) by mouth daily. 02/24/17 03/03/17 Yes Nance Pear, MD  levothyroxine (SYNTHROID, LEVOTHROID) 125 MCG tablet Take 125 mcg by mouth daily.   Yes [provider]  Multiple Vitamins-Minerals (EYE VITAMINS PO) Take by mouth 2 (two) times daily.   Yes [provider]  Multiple Vitamins-Minerals (MULTIVITAMIN ADULT PO) Take by mouth. Centrum silver   Yes [provider]  OVER THE COUNTER MEDICATION combrigan eye drops. 1 drop in each eye BID.   Yes [provider]  potassium chloride SA (K-DUR,KLOR-CON) 20 MEQ tablet Take 2 tablets (40 mEq total) by mouth daily. 01/20/17  Yes Cook, Jayce G, DO  simvastatin (ZOCOR) 40 MG tablet Take 40 mg by mouth daily.   Yes [provider]    Current Medications: . apixaban  5 mg Oral BID  . carvedilol  3.125 mg Oral BID WC  .  furosemide  40 mg Intravenous Once  . levothyroxine  125 mcg Oral QAC breakfast  . potassium chloride  40 mEq Oral Once  . simvastatin  40 mg Oral Daily     Allergies:   No Known Allergies  Social History:   The patient  reports that she has never smoked. She has never used smokeless tobacco. She reports that she does not drink alcohol or use drugs.    Family History:   The patient's family history includes Breast cancer in her sister; Lung cancer in her sister.       PHYSICAL EXAM: VS:  BP 127/83 (BP Location: Left Arm)   Pulse (!) 114   Temp 97.7 F (36.5 C)  (Oral)   Resp 18   Ht 5\' 7"  (1.702 m)   Wt 184 lb 12.8 oz (83.8 kg)   SpO2 99%   BMI 28.94 kg/m  , BMI Body mass index is 28.94 kg/m. GENERAL:  well developed, well nourished, not in acute distress HEENT: normocephalic, pink conjunctivae, anicteric sclerae, no xanthelasma, normal dentition, oropharynx clear NECK:  Mild NVE, carotid upstroke brisk and symmetric, no bruit, no thyromegaly, no lymphadenopathy LUNGS:  good respiratory effort, clear to auscultation bilaterally CV:  PMI not displaced, no thrills, no lifts, irregular S2 within normal limits, no palpable S3 or S4, no murmurs, no rubs, no gallops ABD:  Soft, nontender, nondistended, normoactive bowel sounds, no abdominal aortic bruit, no hepatomegaly, no splenomegaly MS: nontender back, no kyphosis, no scoliosis, no joint deformities EXT:  2+ DP/PT pulses, +1 edema, no varicosities, no cyanosis, no clubbing SKIN: warm, nondiaphoretic, normal turgor, no ulcers NEUROPSYCH: alert, oriented to person, place, and time, sensory/motor grossly intact, normal mood, appropriate affect    EKG:  Afib, VR 143, rapid ventricular rate, nonspecific STTW changes - personally reviewed  Labs:  Recent Labs  02/27/17 2059 02/28/17 0051 02/28/17 0636  TROPONINI 0.03* <0.03 0.03*   Lab Results  Component Value Date   WBC 9.9 02/28/2017   HGB 10.5 (L) 02/28/2017   HCT 31.2 (L) 02/28/2017   MCV 89.2 02/28/2017   PLT 310 02/28/2017    Recent Labs Lab 02/28/17 0636  NA 142  K 3.5  CL 113*  CO2 20*  BUN 19  CREATININE 1.05*  CALCIUM 8.3*  GLUCOSE 89   Lab Results  Component Value Date   CHOL 136 01/16/2017   HDL 37.60 (L) 01/16/2017   LDLCALC 73 01/16/2017   TRIG 129.0 01/16/2017   No results found for: DDIMER  Radiology/Studies:  Dg Chest 2 View  Result Date: 02/27/2017 CLINICAL DATA:  Shortness of breath EXAM: CHEST  2 VIEW COMPARISON:  02/24/2017 FINDINGS: Mild cardiac enlargement. Aortic atherosclerosis noted. Bilateral  pleural effusions are noted. Bilateral lower lobe airspace opacities are again noted compatible with multifocal pneumonia. IMPRESSION: 1. Persistent bilateral airspace opacities compatible with multifocal pneumonia. 2. Bilateral pleural effusions Electronically Signed   By: Kerby Moors M.D.   On: 02/27/2017 20:58   Dg Chest 2 View  Result Date: 02/24/2017 CLINICAL DATA:  Difficulty breathing since last night with chest tightness. History right breast cancer. EXAM: CHEST  2 VIEW COMPARISON:  None. FINDINGS: Lungs are adequately inflated demonstrate airspace opacification over the mid to lower lungs bilaterally left worse than right likely multifocal pneumonia. Likely small associated left pleural effusion. Mild cardiomegaly. Calcified plaque over the aortic arch. Mild degenerate change of the spine. Surgical clips over the right axilla. IMPRESSION: Multifocal airspace process suggesting pneumonia with small left-sided  pleural effusion. Cardiomegaly. Electronically Signed   By: Marin Olp M.D.   On: 02/24/2017 17:39     PROBLEM LIST:  Principal Problem:   CAP (community acquired pneumonia) Active Problems:   Essential hypertension   Hyperlipidemia   Hypothyroidism   Acute systolic CHF (congestive heart failure) (HCC)   Atrial fibrillation ([place])   Echo 02/28/2017: Left ventricle: The cavity size was normal. Wall thickness was   increased in a pattern of mild LVH. Systolic function was   moderately reduced. The estimated ejection fraction was in the   range of 35% to 40%. Diffuse hypokinesis. The study is not   technically sufficient to allow evaluation of LV diastolic   function. - Aortic valve: There was moderate stenosis by velocity ratio, in   setting of reduced ejection fraction/atrial fibrillation.   Recommend to reassess serially. - Mitral valve: There was mild regurgitation. - Left atrium: The atrium was severely dilated. - Right atrium: The atrium was mildly dilated. -  Tricuspid valve: There was moderate regurgitation. - Pulmonary arteries: Systolic pressure was moderately increased.   PA peak pressure: 49 mm Hg (S).   ASSESSMENT AND PLAN:  Atrial fibrillation, newly diagnosed Seen in the office for this as a consultation, 02/26/2017. Please refer to outpatient note Recently started Eliquis 5 mg twice a day. Recommend to start carvedilol 3.125 twice a day for rate control. Should consider cardioversion after appropriate anticoagulation for at least 4 weeks.  CHF, systolic dysfunction, acute, newly diagnosed Patient has not really volunteered a lot of symptoms recently. There is leg swelling which is chronic for which she always takes Lasix. Denied orthopnea. She does complain of chest pressure. She is not clear how long this has been going on for. Did not report this on initial consultation. Recommend Lasix 40 mg IV 1 today and reevaluate in the morning. Potassium replacement. Continue to monitor BMP, daily weights, I's and O's. Sodium restriction. Ruled out for ACS. Recommend pharmacologic nuclear stress test likely on Monday. If no significant ischemia, cardiomyopathy is likely related to tachycardia from atrial fibrillation. Discussed possible cardioversion after appropriate anticoagulation, as mentioned above.  Aortic stenosis Noted on echocardiogram, no appreciable murmur Reassess with serial echocardiogram.  HTN BP elevated. Monitor with diuresis and initiation of carvedilol.  Hyperlipidemia Continue statin therapy.  I spent at least 70 minutes with the patient today and more than 50% of the time was spent counseling the patient and coordinating care.    Signed, Wende Bushy, MD  02/28/2017 12:35 PM  [facility]

## 2017-02-28 NOTE — Progress Notes (Signed)
[place] [facility] [place] NAME: Stephanie Contreras    MR#:  [id number]  DATE OF BIRTH:  [birth year]  SUBJECTIVE:  CHIEF COMPLAINT:   Chief Complaint  Patient presents with  . Shortness of Breath  . Emesis     Came with progressive weakness, was in emergency room 3 days ago and was given oral Levaquin for pneumonia.   In last 2 weeks also found to have atrial fibrillation and started on anti-coagulation by cardiologist recently.   Last night in ER found to have pneumonia and some component of congestive heart failure so given to hospitalist team.  REVIEW OF SYSTEMS:  CONSTITUTIONAL: No fever, fatigue or weakness.  EYES: No blurred or double vision.  EARS, NOSE, AND THROAT: No tinnitus or ear pain.  RESPIRATORY: No cough,Positive for shortness of breath, no wheezing or hemoptysis.  CARDIOVASCULAR: No chest pain, orthopnea, edema.  GASTROINTESTINAL: No nausea, vomiting, diarrhea or abdominal pain.  GENITOURINARY: No dysuria, hematuria.  ENDOCRINE: No polyuria, nocturia,  HEMATOLOGY: No anemia, easy bruising or bleeding SKIN: No rash or lesion. MUSCULOSKELETAL: No joint pain or arthritis.   NEUROLOGIC: No tingling, numbness, weakness.  PSYCHIATRY: No anxiety or depression.   ROS  DRUG ALLERGIES:  No Known Allergies  VITALS:  Blood pressure 127/83, pulse (!) 114, temperature 97.7 F (36.5 C), temperature source Oral, resp. rate 18, height 5\' 7"  (1.702 m), weight 83.8 kg (184 lb 12.8 oz), SpO2 99 %.  PHYSICAL EXAMINATION:  GENERAL:  83 y.o.-year-old patient lying in the bed with no acute distress.  EYES: Pupils equal, round, reactive to light and accommodation. No scleral icterus. Extraocular muscles intact.  HEENT: Head atraumatic, normocephalic. Oropharynx and nasopharynx clear.  NECK:  Supple, no jugular venous distention. No thyroid enlargement, no tenderness.  LUNGS: Normal breath sounds bilaterally, no wheezing, Bilateral crepitation. No use  of accessory muscles of respiration.  CARDIOVASCULAR: S1, S2 normal. No murmurs, rubs, or gallops.  ABDOMEN: Soft, nontender, nondistended. Bowel sounds present. No organomegaly or mass.  EXTREMITIES: Some pedal edema, no cyanosis, or clubbing.  NEUROLOGIC: Cranial nerves II through XII are intact. Muscle strength 5/5 in all extremities. Sensation intact. Gait not checked.  PSYCHIATRIC: The patient is alert and oriented x 3.  SKIN: No obvious rash, lesion, or ulcer.   Physical Exam LABORATORY PANEL:   CBC  Recent Labs Lab 02/28/17 0636  WBC 9.9  HGB 10.5*  HCT 31.2*  PLT 310   ------------------------------------------------------------------------------------------------------------------  Chemistries   Recent Labs Lab 02/28/17 0636  NA 142  K 3.5  CL 113*  CO2 20*  GLUCOSE 89  BUN 19  CREATININE 1.05*  CALCIUM 8.3*   ------------------------------------------------------------------------------------------------------------------  Cardiac Enzymes  Recent Labs Lab 02/28/17 0051 02/28/17 0636  TROPONINI <0.03 0.03*   ------------------------------------------------------------------------------------------------------------------  RADIOLOGY:  Dg Chest 2 View  Result Date: 02/27/2017 CLINICAL DATA:  Shortness of breath EXAM: CHEST  2 VIEW COMPARISON:  02/24/2017 FINDINGS: Mild cardiac enlargement. Aortic atherosclerosis noted. Bilateral pleural effusions are noted. Bilateral lower lobe airspace opacities are again noted compatible with multifocal pneumonia. IMPRESSION: 1. Persistent bilateral airspace opacities compatible with multifocal pneumonia. 2. Bilateral pleural effusions Electronically Signed   By: Kerby Moors M.D.   On: 02/27/2017 20:58    ASSESSMENT AND PLAN:   Principal Problem:   CAP (community acquired pneumonia) Active Problems:   Essential hypertension   Hyperlipidemia   Hypothyroidism   Acute systolic CHF (congestive heart failure)  (HCC)   Atrial fibrillation ([place])  *  CAP (community acquired pneumonia) - IV antibiotics, when necessary supportive medications   Had severe nausea with Levaquin orally as outpatient.  *  Acute systolic CHF (congestive heart failure) (HCC) - this is a new diagnosis, with a BNP greater than 1000.   an echocardiogram and a cardiology consult   Continue IV Lasix.  *  Essential hypertension - elevated, continue home meds, use additional when necessary antihypertensives *  Atrial fibrillation ([place]) - continue home rate controlling medications and anticoagulation *  Hyperlipidemia - continue home meds *  Hypothyroidism - home dose thyroid replacement  All the records are reviewed and case discussed with Care Management/Social Workerr. Management plans discussed with the patient, family and they are in agreement.  CODE STATUS: Full code.  TOTAL TIME TAKING CARE OF THIS PATIENT: 35 minutes.  Discussed with patient's granddaughter in the room. We will get physical therapy.   POSSIBLE D/C IN 2 DAYS, DEPENDING ON CLINICAL CONDITION.   Vaughan Basta M.D on 02/28/2017   Between 7am to 6pm - Pager - [phone]  After 6pm go to [email or web address] - password [email or web address] [facility]  Office  [phone]  CC: Primary care physician; Coral Spikes, DO  Note: This dictation was prepared with Dragon dictation along with smaller phrase technology. Any transcriptional errors that result from this process are unintentional.

## 2017-02-28 NOTE — Evaluation (Signed)
Physical Therapy Evaluation Patient Details Name: Stephanie Contreras MRN: [id number] DOB: 1929-01-21 Today's Date: 02/28/2017   History of Present Illness  83 yo female with onset of CAP and SOB was admitted, has exacerbation of a-fib and new onset CHF.  PMHx:  HTN, A-fib, hypothyroidism  Clinical Impression  Pt is having a lot of fluctuation in her pulse with mobility, starting at 98 at rest and letting it get to 144 by end of session. Her values are rapidly down to normal but talked with her about getting further therapy and about help at home to monitor her tendency to get so tachy with gait.  Will follow acutely and progress her gait and balance as much as vitals allow, then strengthen hips as tolerated.  Her family is supportive and will help care for her after hosp and SNF.    Follow Up Recommendations SNF    Equipment Recommendations  None recommended by PT    Recommendations for Other Services       Precautions / Restrictions Precautions Precautions: Fall (telemetry) Restrictions Weight Bearing Restrictions: No      Mobility  Bed Mobility Overal bed mobility: Needs Assistance Bed Mobility: Supine to Sit;Sit to Supine     Supine to sit: Min assist;Mod assist Sit to supine: Mod assist   General bed mobility comments: Pt is requiring max assist to get scooted up side of bed and into bed at the top  Transfers Overall transfer level: Needs assistance Equipment used: Rolling walker (2 wheeled);1 person hand held assist Transfers: Sit to/from [facility] Sit to Stand: Min assist Stand pivot transfers: Min assist       General transfer comment: reminders for  hand placement  Ambulation/Gait Ambulation/Gait assistance: Min assist Ambulation Distance (Feet): 150 Feet Assistive device: Rolling walker (2 wheeled);1 person hand held assist Gait Pattern/deviations: Step-through pattern;Decreased stride length;Wide base of support;Trunk flexed;Drifts  right/left Gait velocity: reduced Gait velocity interpretation: Below normal speed for age/gender General Gait Details: pt has a  tendency to look a bit buckly as she fatigues, pulse 144  Stairs            Wheelchair Mobility    Modified Rankin (Stroke Patients Only)       Balance Overall balance assessment: Needs assistance Sitting-balance support: Feet supported Sitting balance-Leahy Scale: Fair       Standing balance-Leahy Scale: Poor                               Pertinent Vitals/Pain Pain Assessment: No/denies pain    Home Living Family/patient expects to be discharged to:: Private residence Living Arrangements: Other relatives;Children Available Help at Discharge: Family;Available PRN/intermittently Type of Home: House Home Access: Stairs to enter     Home Layout: One level Home Equipment: Environmental consultant - 4 wheels;Cane - single point Additional Comments: Pt is thinking she can manage fine to get home.    Prior Function Level of Independence: Independent with assistive device(s)               Hand Dominance        Extremity/Trunk Assessment   Upper Extremity Assessment Upper Extremity Assessment: Overall WFL for tasks assessed    Lower Extremity Assessment Lower Extremity Assessment: Generalized weakness    Cervical / Trunk Assessment Cervical / Trunk Assessment: Kyphotic  Communication   Communication: No difficulties  Cognition Arousal/Alertness: Awake/alert Behavior During Therapy: WFL for tasks assessed/performed Overall Cognitive Status: Within  Functional Limits for tasks assessed                                        General Comments      Exercises     Assessment/Plan    PT Assessment Patient needs continued PT services  PT Problem List Decreased strength;Decreased range of motion;Decreased activity tolerance;Decreased balance;Decreased mobility;Decreased coordination;Decreased knowledge of use of  DME;Decreased cognition;Decreased safety awareness;Cardiopulmonary status limiting activity;Decreased knowledge of precautions       PT Treatment Interventions      PT Goals (Current goals can be found in the Care Plan section)  Acute Rehab PT Goals Patient Stated Goal: to walk and feel better PT Goal Formulation: With patient Time For Goal Achievement: 03/14/17 Potential to Achieve Goals: Good    Frequency Min 2X/week   Barriers to discharge Decreased caregiver support may need to be supervised 24/7 upon initial discharge    Co-evaluation               AM-PAC PT "6 Clicks" Daily Activity  Outcome Measure Difficulty turning over in bed (including adjusting bedclothes, sheets and blankets)?: Total Difficulty moving from lying on back to sitting on the side of the bed? : Total Difficulty sitting down on and standing up from a chair with arms (e.g., wheelchair, bedside commode, etc,.)?: Total Help needed moving to and from a bed to chair (including a wheelchair)?: A Little Help needed walking in hospital room?: A Little Help needed climbing 3-5 steps with a railing? : Total 6 Click Score: 10    End of Session Equipment Utilized During Treatment: Gait belt Activity Tolerance: Patient tolerated treatment well Patient left: in bed;with call bell/phone within reach;with bed alarm set Nurse Communication: Mobility status PT Visit Diagnosis: Unsteadiness on feet (R26.81);Difficulty in walking, not elsewhere classified (R26.2);Muscle weakness (generalized) (M62.81)    Time: 5852-7782 PT Time Calculation (min) (ACUTE ONLY): 37 min   Charges:   PT Evaluation $PT Eval Moderate Complexity: 1 Procedure PT Treatments $Gait Training: 8-22 mins   PT G Codes:   PT G-Codes **NOT FOR INPATIENT CLASS** Functional Assessment Tool Used: AM-PAC 6 Clicks Basic Mobility     Ramond Dial 02/28/2017, 3:26 PM   Mee Hives, PT MS Acute Rehab Dept. Number: [place] [facility] [place]

## 2017-03-01 DIAGNOSIS — R748 Abnormal levels of other serum enzymes: Secondary | ICD-10-CM

## 2017-03-01 LAB — URINE CULTURE: Culture: NO GROWTH

## 2017-03-01 LAB — BASIC METABOLIC PANEL
ANION GAP: 9 (ref 5–15)
BUN: 22 mg/dL — ABNORMAL HIGH (ref 6–20)
CALCIUM: 8.5 mg/dL — AB (ref 8.9–10.3)
CO2: 25 mmol/L (ref 22–32)
CREATININE: 1.34 mg/dL — AB (ref 0.44–1.00)
Chloride: 109 mmol/L (ref 101–111)
GFR, EST AFRICAN AMERICAN: 40 mL/min — AB (ref >60)
GFR, EST NON AFRICAN AMERICAN: 35 mL/min — AB (ref >60)
Glucose, Bld: 105 mg/dL — ABNORMAL HIGH (ref 65–99)
Potassium: 4.2 mmol/L (ref 3.5–5.1)
SODIUM: 143 mmol/L (ref 135–145)

## 2017-03-01 MED ORDER — DEXTROSE 5 % IV SOLN: 1.0000 g | INTRAVENOUS | Status: DC

## 2017-03-01 MED ORDER — FUROSEMIDE 40 MG PO TABS: 40.0000 mg | ORAL_TABLET | Freq: Every day | ORAL | Status: DC

## 2017-03-01 MED ORDER — AZITHROMYCIN 250 MG PO TABS
250.0000 mg | ORAL_TABLET | Freq: Every day | ORAL | Status: DC
  Administered 2017-03-01 – 2017-03-03 (×3): 250 mg via ORAL
  Filled 2017-03-01 (×3): qty 1

## 2017-03-01 MED ORDER — DEXTROSE 5 % IV SOLN
1.0000 g | INTRAVENOUS | Status: DC
  Administered 2017-03-01 – 2017-03-02 (×2): 1 g via INTRAVENOUS
  Filled 2017-03-01 (×3): qty 10

## 2017-03-01 MED ORDER — DEXTROSE 5 % IV SOLN
500.0000 mg | INTRAVENOUS | Status: DC
  Filled 2017-03-01: qty 500

## 2017-03-01 NOTE — Progress Notes (Addendum)
Progress Note  Patient Name: Stephanie Contreras Date of Encounter: 03/01/2017  Primary Cardiologist: Dr. Yvone Neu  Subjective   Patient is celebrating a birthday today. She had a rough night, mainly because she was restless. No specific complaints of chest pain or shortness of breath.  This morning, she feels overall better. Continues to have some brief episodes of chest pressure, nonsustained. Breathing is somewhat better.  Urine output was not strictly being monitored.  Inpatient Medications    Scheduled Meds: . apixaban  5 mg Oral BID  . brimonidine  1 drop Both Eyes BID  . carvedilol  3.125 mg Oral BID WC  . [START ON 03/02/2017] furosemide  40 mg Oral Daily  . levothyroxine  125 mcg Oral QAC breakfast  . simvastatin  40 mg Oral Daily   Continuous Infusions: . ceFEPIme (MAXIPIME) 2 GM IVP Stopped (03/01/17 1046)  . vancomycin 1,250 mg (02/28/17 1508)   PRN Meds: acetaminophen **OR** acetaminophen, benzonatate, guaiFENesin-dextromethorphan, ipratropium-albuterol, ketorolac, labetalol, ondansetron **OR** ondansetron (ZOFRAN) IV, senna   Vital Signs    Vitals:   02/28/17 2100 03/01/17 0445 03/01/17 0446 03/01/17 0500  BP:  (!) 143/121 (!) 145/97   Pulse: 67 96 (!) 150 (!) 113  Resp:  15    Temp:  98.3 F (36.8 C)    TempSrc:  Oral    SpO2:  92%    Weight:   185 lb 9.6 oz (84.2 kg)   Height:        Intake/Output Summary (Last 24 hours) at 03/01/17 1134 Last data filed at 03/01/17 0931  Gross per 24 hour  Intake              480 ml  Output                0 ml  Net              480 ml   Filed Weights   02/27/17 2027 02/28/17 0228 03/01/17 0446  Weight: 186 lb (84.4 kg) 184 lb 12.8 oz (83.8 kg) 185 lb 9.6 oz (84.2 kg)    Telemetry    Atrial fibrillation, personally reviewed  ECG    No new EKG  Physical Exam  PHYSICAL EXAM: VS:  BP (!) 145/97   Pulse (!) 113   Temp 98.3 F (36.8 C) (Oral)   Resp 15   Ht 5\' 7"  (1.702 m)   Wt 185 lb 9.6 oz (84.2 kg)    SpO2 92%   BMI 29.07 kg/m  , BMI Body mass index is 29.07 kg/m. GENERAL:  well developed, well nourished, not in acute distress HEENT: normocephalic, pink conjunctivae, anicteric sclerae, no xanthelasma, normal dentition, oropharynx clear NECK:  no neck vein engorgement, JVP normal, no hepatojugular reflux, carotid upstroke brisk and symmetric, no bruit, no thyromegaly, no lymphadenopathy LUNGS:  good respiratory effort, clear to auscultation bilaterally CV:  PMI not displaced, no thrills, no lifts, irregular S2 within normal limits, no palpable S3 or S4, no murmurs, no rubs, no gallops ABD:  Soft, nontender, nondistended, normoactive bowel sounds, no abdominal aortic bruit, no hepatomegaly, no splenomegaly MS: nontender back, no kyphosis, no scoliosis, no joint deformities EXT:  2+ DP/PT pulses, +1 edema, no varicosities, no cyanosis, no clubbing SKIN: warm, nondiaphoretic, normal turgor, no ulcers NEUROPSYCH: alert, oriented to person, place, and time, sensory/motor grossly intact, normal mood, appropriate affect    Labs    Chemistry  Recent Labs Lab 02/27/17 2059 02/28/17 0636 03/01/17 0547  NA  142 142 143  K 3.7 3.5 4.2  CL 111 113* 109  CO2 21* 20* 25  GLUCOSE 98 89 105*  BUN 21* 19 22*  CREATININE 1.33* 1.05* 1.34*  CALCIUM 8.9 8.3* 8.5*  GFRNONAA 35* 47* 35*  GFRAA 41* 55* 40*  ANIONGAP 10 9 9      Hematology  Recent Labs Lab 02/24/17 1640 02/27/17 2122 02/28/17 3254  WBC 98.2 DUPLICATE  64.1 9.9  RBC 5.83 DUPLICATE  0.94 0.76*  HGB 80.8 DUPLICATE  81.1* 03.1*  HCT 59.4 DUPLICATE  58.5* 92.9*  MCV 24.4 DUPLICATE  62.8 63.8  MCH 17.7 DUPLICATE  11.6 57.9  MCHC 03.8 DUPLICATE  33.3 83.2  RDW 91.9* DUPLICATE  16.6* 06.0*  PLT 045 DUPLICATE  997 741    Cardiac Enzymes  Recent Labs Lab 02/27/17 2059 02/28/17 0051 02/28/17 0636 02/28/17 1311  TROPONINI 0.03* <0.03 0.03* 0.06*   No results for input(s): TROPIPOC in the last 168 hours.    BNP  Recent Labs Lab 02/27/17 2122  BNP 1,190.0*     DDimer No results for input(s): DDIMER in the last 168 hours.   Radiology    Dg Chest 2 View  Result Date: 02/27/2017 CLINICAL DATA:  Shortness of breath EXAM: CHEST  2 VIEW COMPARISON:  02/24/2017 FINDINGS: Mild cardiac enlargement. Aortic atherosclerosis noted. Bilateral pleural effusions are noted. Bilateral lower lobe airspace opacities are again noted compatible with multifocal pneumonia. IMPRESSION: 1. Persistent bilateral airspace opacities compatible with multifocal pneumonia. 2. Bilateral pleural effusions Electronically Signed   By: Kerby Moors M.D.   On: 02/27/2017 20:58    Cardiac Studies   Echo 02/28/2017: Left ventricle: The cavity size was normal. Wall thickness was increased in a pattern of mild LVH. Systolic function was moderately reduced. The estimated ejection fraction was in the range of 35% to 40%. Diffuse hypokinesis. The study is not technically sufficient to allow evaluation of LV diastolic function. - Aortic valve: There was moderate stenosis by velocity ratio, in setting of reduced ejection fraction/atrial fibrillation. Recommend to reassess serially. - Mitral valve: There was mild regurgitation. - Left atrium: The atrium was severely dilated. - Right atrium: The atrium was mildly dilated. - Tricuspid valve: There was moderate regurgitation. - Pulmonary arteries: Systolic pressure was moderately increased. PA peak pressure: 49 mm Hg (S).   Assessment & Plan    Atrial fibrillation, newly diagnosed Seen in the office for this as a consultation, 02/26/2017. Please refer to outpatient note Recently started Eliquis 5 mg twice a day. Continue carvedilol. May uptitrate for better rate control. Consider cardioversion after appropriate anticoagulation for at least 4 weeks.   CHF, systolic dysfunction, acute, newly diagnosed Patient has not really volunteered a lot of symptoms  recently. There is leg swelling which is chronic for which she always takes Lasix. Denied orthopnea. She does complain of chest pressure. She is not clear how long this has been going on for. Did not report this on initial consultation. Given another dose of IV Lasix yesterday. Will hold for today. Consider Lasix 40 mg by mouth daily beginning tomorrow. Continue beta blocker therapy. Eventually, consider Entresto.  Sodium restriction, daily weights, strict I's and Os.   minimally elevated troponin No acute EKG changes Patient has been complaining of chest pressure. Likely demand ischemia. She is scheduled for a pharmacologic nuclear stress test tomorrow. LVEF also reduced on echocardiogram. If no significant ischemia, cardiomyopathy is likely related to tachycardia from atrial fibrillation. Discussed possible cardioversion after appropriate anticoagulation, as  mentioned above.  Aortic stenosis Noted on echocardiogram, no appreciable murmur Reassess with serial echocardiogram as an outpatient.  HTN Continue to monitor with carvedilol. May uptitrate as necessary. Eventually will benefit from [place].  Hyperlipidemia Continue statin therapy.  I spent at least 35 minutes with the patient today and more than 50% of the time was spent counseling the patient and coordinating care.    Signed, Wende Bushy, MD  03/01/2017, 11:34 AM

## 2017-03-01 NOTE — Progress Notes (Signed)
[place] at [place] NAME: Stephanie Contreras    MR#:  [id number]  DATE OF BIRTH:  1929-09-23  SUBJECTIVE:  CHIEF COMPLAINT:   Chief Complaint  Patient presents with  . Shortness of Breath  . Emesis     Came with progressive weakness, was in emergency room 3 days ago and was given oral Levaquin for pneumonia.   In last 2 weeks also found to have atrial fibrillation and started on anti-coagulation by cardiologist recently.    in ER found to have pneumonia and some component of congestive heart failure so given to hospitalist team.   Feels better now, her daughter , son in law and grand daughter present in room.  REVIEW OF SYSTEMS:  CONSTITUTIONAL: No fever, fatigue or weakness.  EYES: No blurred or double vision.  EARS, NOSE, AND THROAT: No tinnitus or ear pain.  RESPIRATORY: No cough,Positive for shortness of breath, no wheezing or hemoptysis.  CARDIOVASCULAR: No chest pain, orthopnea, edema.  GASTROINTESTINAL: No nausea, vomiting, diarrhea or abdominal pain.  GENITOURINARY: No dysuria, hematuria.  ENDOCRINE: No polyuria, nocturia,  HEMATOLOGY: No anemia, easy bruising or bleeding SKIN: No rash or lesion. MUSCULOSKELETAL: No joint pain or arthritis.   NEUROLOGIC: No tingling, numbness, weakness.  PSYCHIATRY: No anxiety or depression.   ROS  DRUG ALLERGIES:  No Known Allergies  VITALS:  Blood pressure (!) 121/96, pulse (!) 111, temperature 98.2 F (36.8 C), temperature source Oral, resp. rate 18, height 5\' 7"  (1.702 m), weight 84.2 kg (185 lb 9.6 oz), SpO2 90 %.  PHYSICAL EXAMINATION:  GENERAL:  83 y.o.-year-old patient lying in the bed with no acute distress.  EYES: Pupils equal, round, reactive to light and accommodation. No scleral icterus. Extraocular muscles intact.  HEENT: Head atraumatic, normocephalic. Oropharynx and nasopharynx clear.  NECK:  Supple, no jugular venous distention. No thyroid enlargement, no tenderness.  LUNGS:  Normal breath sounds bilaterally, no wheezing, Bilateral some crepitation. No use of accessory muscles of respiration.  CARDIOVASCULAR: S1, S2 normal. No murmurs, rubs, or gallops.  ABDOMEN: Soft, nontender, nondistended. Bowel sounds present. No organomegaly or mass.  EXTREMITIES: Some pedal edema, no cyanosis, or clubbing.  NEUROLOGIC: Cranial nerves II through XII are intact. Muscle strength 4/5 in all extremities. Sensation intact. Gait not checked.  PSYCHIATRIC: The patient is alert and oriented x 3.  SKIN: No obvious rash, lesion, or ulcer.   Physical Exam LABORATORY PANEL:   CBC  Recent Labs Lab 02/28/17 0636  WBC 9.9  HGB 10.5*  HCT 31.2*  PLT 310   ------------------------------------------------------------------------------------------------------------------  Chemistries   Recent Labs Lab 03/01/17 0547  NA 143  K 4.2  CL 109  CO2 25  GLUCOSE 105*  BUN 22*  CREATININE 1.34*  CALCIUM 8.5*   ------------------------------------------------------------------------------------------------------------------  Cardiac Enzymes  Recent Labs Lab 02/28/17 0636 02/28/17 1311  TROPONINI 0.03* 0.06*   ------------------------------------------------------------------------------------------------------------------  RADIOLOGY:  No results found.  ASSESSMENT AND PLAN:   Principal Problem:   CAP (community acquired pneumonia) Active Problems:   Essential hypertension   Hyperlipidemia   Hypothyroidism   Acute systolic CHF (congestive heart failure) (HCC)   Atrial fibrillation (HCC)  * CAP (community acquired pneumonia) - IV antibiotics, when necessary supportive medications   Had severe nausea with Levaquin orally as outpatient.   Switched to rocephin + azithromycin.  *  Acute systolic CHF (congestive heart failure) (HCC) - this is a new diagnosis, with a BNP greater than 1000.   an echocardiogram shows  decreased EF recently, appreciated cardiology  consult   Continue Lasix. Start oral lasix on 03/02/17   Cardiologist suggested to have stress test on Monday to r/o ischemia.  *  Essential hypertension - elevated, continue home meds, use additional when necessary antihypertensives *  Atrial fibrillation ([place]) - continue home rate controlling medications and anticoagulation *  Hyperlipidemia - continue home meds *  Hypothyroidism - home dose thyroid replacement  All the records are reviewed and case discussed with Care Management/Social Workerr. Management plans discussed with the patient, family and they are in agreement.  CODE STATUS: Full code.  TOTAL TIME TAKING CARE OF THIS PATIENT: 35 minutes.  Discussed with patient's granddaughter in the room. We will get physical therapy.   POSSIBLE D/C IN 2 DAYS, DEPENDING ON CLINICAL CONDITION.   Vaughan Basta M.D on 03/01/2017   Between 7am to 6pm - Pager - [phone]  After 6pm go to [email or web address] - password [email or web address] [facility]  Office  [phone]  CC: Primary care physician; Coral Spikes, DO  Note: This dictation was prepared with Dragon dictation along with smaller phrase technology. Any transcriptional errors that result from this process are unintentional.

## 2017-03-02 ENCOUNTER — Other Ambulatory Visit: Payer: MEDICARE

## 2017-03-02 LAB — BASIC METABOLIC PANEL
Anion gap: 7 (ref 5–15)
BUN: 24 mg/dL — AB (ref 6–20)
CALCIUM: 9.1 mg/dL (ref 8.9–10.3)
CO2: 26 mmol/L (ref 22–32)
CREATININE: 1.1 mg/dL — AB (ref 0.44–1.00)
Chloride: 110 mmol/L (ref 101–111)
GFR calc Af Amer: 51 mL/min — ABNORMAL LOW (ref >60)
GFR calc non Af Amer: 44 mL/min — ABNORMAL LOW (ref >60)
GLUCOSE: 104 mg/dL — AB (ref 65–99)
Potassium: 3.8 mmol/L (ref 3.5–5.1)
Sodium: 143 mmol/L (ref 135–145)

## 2017-03-02 LAB — CBC
HCT: 34.9 % — ABNORMAL LOW (ref 35.0–47.0)
Hemoglobin: 11.6 g/dL — ABNORMAL LOW (ref 12.0–16.0)
MCH: 29.4 pg (ref 26.0–34.0)
MCHC: 33.2 g/dL (ref 32.0–36.0)
MCV: 88.5 fL (ref 80.0–100.0)
Platelets: 325 10*3/uL (ref 150–440)
RBC: 3.95 MIL/uL (ref 3.80–5.20)
RDW: 14.9 % — AB (ref 11.5–14.5)
WBC: 10.9 10*3/uL (ref 3.6–11.0)

## 2017-03-02 MED ORDER — FUROSEMIDE 40 MG PO TABS
40.0000 mg | ORAL_TABLET | Freq: Every day | ORAL | Status: DC
  Administered 2017-03-02: 40 mg via ORAL
  Filled 2017-03-02: qty 1

## 2017-03-02 MED ORDER — FUROSEMIDE 40 MG PO TABS
40.0000 mg | ORAL_TABLET | Freq: Two times a day (BID) | ORAL | Status: DC
  Administered 2017-03-02 – 2017-03-03 (×2): 40 mg via ORAL
  Filled 2017-03-02 (×2): qty 1

## 2017-03-02 MED ORDER — POTASSIUM CHLORIDE CRYS ER 20 MEQ PO TBCR
20.0000 meq | EXTENDED_RELEASE_TABLET | Freq: Every day | ORAL | Status: DC
  Administered 2017-03-02 – 2017-03-06 (×4): 20 meq via ORAL
  Filled 2017-03-02 (×4): qty 1

## 2017-03-02 MED ORDER — FUROSEMIDE 10 MG/ML IJ SOLN: 40.0000 mg | Freq: Every day | INTRAMUSCULAR | Status: DC

## 2017-03-02 MED ORDER — CARVEDILOL 25 MG PO TABS: 25.0000 mg | ORAL_TABLET | Freq: Two times a day (BID) | ORAL | Status: DC

## 2017-03-02 MED ORDER — LOSARTAN POTASSIUM 25 MG PO TABS
25.0000 mg | ORAL_TABLET | Freq: Every day | ORAL | Status: DC
  Administered 2017-03-02 – 2017-03-06 (×4): 25 mg via ORAL
  Filled 2017-03-02 (×4): qty 1

## 2017-03-02 MED ORDER — CARVEDILOL 12.5 MG PO TABS: 12.5000 mg | ORAL_TABLET | Freq: Two times a day (BID) | ORAL | Status: DC

## 2017-03-02 MED ORDER — ENSURE ENLIVE PO LIQD
237.0000 mL | Freq: Two times a day (BID) | ORAL | Status: DC
  Administered 2017-03-02 – 2017-03-06 (×6): 237 mL via ORAL

## 2017-03-02 MED ORDER — CARVEDILOL 12.5 MG PO TABS
12.5000 mg | ORAL_TABLET | Freq: Two times a day (BID) | ORAL | Status: DC
  Administered 2017-03-02 – 2017-03-03 (×2): 12.5 mg via ORAL
  Filled 2017-03-02 (×2): qty 1

## 2017-03-02 NOTE — Care Management Important Message (Signed)
Important Message  Patient Details  Name: Stephanie Contreras MRN: [id number] Date of Birth: May 08, 1929   Medicare Important Message Given:  Yes Signed IM notice given    Katrina Stack, RN 03/02/2017, 3:46 PM

## 2017-03-02 NOTE — Progress Notes (Signed)
[place] [facility] [place] NAME: Stephanie Contreras    MR#:  [id number]  DATE OF BIRTH:  [birth year]  SUBJECTIVE:  CHIEF COMPLAINT:   Chief Complaint  Patient presents with  . Shortness of Breath  . Emesis     Came with progressive weakness, was in emergency room 3 days ago and was given oral Levaquin for pneumonia.   In last 2 weeks also found to have atrial fibrillation and started on anti-coagulation by cardiologist recently.    Feeling some better.  REVIEW OF SYSTEMS:  CONSTITUTIONAL: No fever, fatigue or weakness.  EYES: No blurred or double vision.  EARS, NOSE, AND THROAT: No tinnitus or ear pain.  RESPIRATORY: No cough,Positive for shortness of breath, no wheezing or hemoptysis.  CARDIOVASCULAR: No chest pain, orthopnea, edema.  GASTROINTESTINAL: No nausea, vomiting, diarrhea or abdominal pain.  GENITOURINARY: No dysuria, hematuria.  ENDOCRINE: No polyuria, nocturia,  HEMATOLOGY: No anemia, easy bruising or bleeding SKIN: No rash or lesion. MUSCULOSKELETAL: No joint pain or arthritis.   NEUROLOGIC: No tingling, numbness, weakness.  PSYCHIATRY: No anxiety or depression.   ROS  DRUG ALLERGIES:  No Known Allergies  VITALS:  Blood pressure 134/82, pulse 84, temperature 97.8 F (36.6 C), temperature source Oral, resp. rate 16, height 5\' 7"  (1.702 m), weight 85.2 kg (187 lb 14.4 oz), SpO2 93 %.  PHYSICAL EXAMINATION:  GENERAL:  83 y.o.-year-old patient lying in the bed with no acute distress.  EYES: Pupils equal, round, reactive to light and accommodation. No scleral icterus. Extraocular muscles intact.  HEENT: Head atraumatic, normocephalic. Oropharynx and nasopharynx clear.  NECK:  Supple, no jugular venous distention. No thyroid enlargement, no tenderness.  LUNGS: Bilateral crackles. No use of accessory muscles of respiration.  CARDIOVASCULAR: S1, S2 normal. No murmurs, rubs, or gallops.  ABDOMEN: Soft, nontender, nondistended. Bowel  sounds present. No organomegaly or mass.  EXTREMITIES: Some pedal edema, no cyanosis, or clubbing.  NEUROLOGIC: Cranial nerves II through XII are intact. Muscle strength 4/5 in all extremities. Sensation intact. Gait not checked.  PSYCHIATRIC: The patient is alert and oriented x 3 SKIN: No obvious rash, lesion, or ulcer.   Physical Exam LABORATORY PANEL:   CBC  Recent Labs Lab 03/02/17 0508  WBC 10.9  HGB 11.6*  HCT 34.9*  PLT 325   ------------------------------------------------------------------------------------------------------------------  Chemistries   Recent Labs Lab 03/02/17 0508  NA 143  K 3.8  CL 110  CO2 26  GLUCOSE 104*  BUN 24*  CREATININE 1.10*  CALCIUM 9.1   ------------------------------------------------------------------------------------------------------------------  Cardiac Enzymes  Recent Labs Lab 02/28/17 0636 02/28/17 1311  TROPONINI 0.03* 0.06*   ------------------------------------------------------------------------------------------------------------------  RADIOLOGY:  No results found.  ASSESSMENT AND PLAN:   Principal Problem:   CAP (community acquired pneumonia) Active Problems:   Essential hypertension   Hyperlipidemia   Hypothyroidism   Acute systolic CHF (congestive heart failure) (HCC)   Atrial fibrillation ([place])  * CAP (community acquired pneumonia) - IV antibiotics, when necessary supportive medications.   Had severe nausea with Levaquin orally as outpatient.   Switched to rocephin + azithromycin.  *  Acute on chronic systolic CHF (congestive heart failure) EF 35-40%  Increase lasix to 40mg  BID ST tomorrow  *  Essential hypertension - elevated, continue home meds, use additional when necessary antihypertensives  *  Atrial fibrillation ([place]) - continue home rate controlling medications and anticoagulation.  *  Hyperlipidemia - continue home meds  *  Hypothyroidism - home dose thyroid replacement  All  the records are reviewed and case discussed with Care Management/Social Worker Management plans discussed with the patient, family and they are in agreement.  CODE STATUS: Full code.  TOTAL TIME TAKING CARE OF THIS PATIENT: 35 minutes.   Discussed with patient's granddaughter in the room.  POSSIBLE D/C IN 1-2 DAYS, DEPENDING ON CLINICAL CONDITION.  Hillary Bow R M.D on 03/02/2017   Between 7am to 6pm - Pager - [phone]  After 6pm go to [email or web address] - password [email or web address] [facility]  Office  [phone]  CC: Primary care physician; Coral Spikes, DO  Note: This dictation was prepared with Dragon dictation along with smaller phrase technology. Any transcriptional errors that result from this process are unintentional.

## 2017-03-02 NOTE — Care Management Note (Signed)
Case Management Note  Patient Details  Name: Stephanie Contreras MRN: [id number] Date of Birth: [birth year]  Subjective/Objective:                 new diagnosis of CHF.  Being treated for pneumonia also that was being treated as an outpatient on oral antibiotics.  Currently not requiring supplemental oxygen. Physical therapy has recommended skilled nursing placement.  Lives in a split level home with her daughter who is in bad health and granddaughter. Sometimes she needs help with her personal care and getting dressed,has access to a walker and cane, denies issues accessing medical care, obtaining medications or with transportation.  Current with her PCP.  Agreeable to SNF   Action/Plan:   Updated clinical social work  xpected Discharge Date:                  Expected Discharge Plan:     In-House Referral:     Discharge planning Services     Post Acute Care Choice:    Choice offered to:     DME Arranged:    DME Agency:     HH Arranged:    HH Agency:     Status of Service:     If discussed at [employer], dates discussed:    Additional Comments:  Katrina Stack, RN 03/02/2017, 9:20 AM

## 2017-03-02 NOTE — Progress Notes (Signed)
Patient Name: Stephanie Contreras Date of Encounter: 03/02/2017  Primary Cardiologist: [facility] Problem List     Principal Problem:   CAP (community acquired pneumonia) Active Problems:   Essential hypertension   Hyperlipidemia   Hypothyroidism   Acute systolic CHF (congestive heart failure) (HCC)   Atrial fibrillation (HCC)     Subjective   No acute overnight events. Remains in Afib with heart rates in the low 100s to 140s bpm. No chest pain. She is for nuclear stress test this AM. Breathing about the same.   Inpatient Medications    Scheduled Meds: . apixaban  5 mg Oral BID  . azithromycin  250 mg Oral Daily  . brimonidine  1 drop Both Eyes BID  . carvedilol  12.5 mg Oral BID WC  . furosemide  40 mg Intravenous Daily  . levothyroxine  125 mcg Oral QAC breakfast  . simvastatin  40 mg Oral Daily   Continuous Infusions: . cefTRIAXone (ROCEPHIN) IVPB 1 gram/50 mL D5W Stopped (03/01/17 1755)   PRN Meds: acetaminophen **OR** acetaminophen, benzonatate, guaiFENesin-dextromethorphan, ipratropium-albuterol, ketorolac, labetalol, ondansetron **OR** ondansetron (ZOFRAN) IV, senna   Vital Signs    Vitals:   03/01/17 1243 03/01/17 2000 03/02/17 0407 03/02/17 0408  BP: 136/78 (!) 121/96 137/87 (!) 147/87  Pulse: 91 (!) 111 (!) 101 (!) 103  Resp: 16 18 16 16   Temp: 98.1 F (36.7 C) 98.2 F (36.8 C) 97.8 F (36.6 C)   TempSrc:  Oral Oral   SpO2: 94% 90% (!) 88% 93%  Weight:   187 lb 14.4 oz (85.2 kg)   Height:        Intake/Output Summary (Last 24 hours) at 03/02/17 0829 Last data filed at 03/01/17 1755  Gross per 24 hour  Intake              530 ml  Output              200 ml  Net              330 ml   Filed Weights   02/28/17 0228 03/01/17 0446 03/02/17 0407  Weight: 184 lb 12.8 oz (83.8 kg) 185 lb 9.6 oz (84.2 kg) 187 lb 14.4 oz (85.2 kg)    Physical Exam    GEN: Well nourished, well developed, in no acute distress. HOH. HEENT: Grossly normal.    Neck: Supple, no JVD, carotid bruits, or masses. Cardiac: Irregularly irregular, no murmurs, rubs, or gallops. No clubbing, cyanosis, trace pre-tibial edema bilaterally L>R.  Radials/DP/PT 2+ and equal bilaterally.  Respiratory:  Diminished breath sounds bilaterally with faint bibasilar crackles. GI: Soft, nontender, nondistended, BS + x 4. MS: no deformity or atrophy. Skin: warm and dry, no rash. Neuro:  Strength and sensation are intact. Psych: AAOx3.  Normal affect.  Labs    CBC  Recent Labs  02/27/17 2122 02/28/17 0636 32/99/24 2683  WBC DUPLICATE  41.9 9.9 62.2  NEUTROABS 8.3*  PENDING  --   --   HGB DUPLICATE  29.7* 98.9* 21.1*  HCT DUPLICATE  94.1* 74.0* 81.4*  MCV DUPLICATE  48.1 85.6 31.4  PLT DUPLICATE  970 263 785   Basic Metabolic Panel  Recent Labs  03/01/17 0547 03/02/17 0508  NA 143 143  K 4.2 3.8  CL 109 110  CO2 25 26  GLUCOSE 105* 104*  BUN 22* 24*  CREATININE 1.34* 1.10*  CALCIUM 8.5* 9.1   Liver Function Tests No results for input(s): AST,  ALT, ALKPHOS, BILITOT, PROT, ALBUMIN in the last 72 hours. No results for input(s): LIPASE, AMYLASE in the last 72 hours. Cardiac Enzymes  Recent Labs  02/28/17 0051 02/28/17 0636 02/28/17 1311  TROPONINI <0.03 0.03* 0.06*   BNP Invalid input(s): POCBNP D-Dimer No results for input(s): DDIMER in the last 72 hours. Hemoglobin A1C No results for input(s): HGBA1C in the last 72 hours. Fasting Lipid Panel No results for input(s): CHOL, HDL, LDLCALC, TRIG, CHOLHDL, LDLDIRECT in the last 72 hours. Thyroid Function Tests No results for input(s): TSH, T4TOTAL, T3FREE, THYROIDAB in the last 72 hours.  Invalid input(s): FREET3  Telemetry    Afib, low 100s to 140s bpm - Personally Reviewed  ECG    n/a - Personally Reviewed  Radiology    No results found.  Cardiac Studies   TTE 02/28/2017: Study Conclusions  - Left ventricle: The cavity size was normal. Wall thickness was   increased  in a pattern of mild LVH. Systolic function was   moderately reduced. The estimated ejection fraction was in the   range of 35% to 40%. Diffuse hypokinesis. The study is not   technically sufficient to allow evaluation of LV diastolic   function. - Aortic valve: There was moderate stenosis by velocity ratio, in   setting of reduced ejection fraction/atrial fibrillation.   Recommend to reassess serially. - Mitral valve: There was mild regurgitation. - Left atrium: The atrium was severely dilated. - Right atrium: The atrium was mildly dilated. - Tricuspid valve: There was moderate regurgitation. - Pulmonary arteries: Systolic pressure was moderately increased.   PA peak pressure: 49 mm Hg (S).  Nuclear stress test pending  Patient Profile     83 y.o. female with history of acute systolic CHF, breast cancer, hypothyroidism, and HLD who was noted to be in new onset Afib with RVR in the setting of CAP.   Assessment & Plan    1. New onset Afib with RVR: -Remains in Afib with heart rates in the low 100s to 140s bpm -Titrate Coreg to 25 mg bid for rate control -Continue Eliquis 5 mg bid (does not meet 2/3 reduced dosing criteria) -If her heart rate remains difficult to control may need TEE/DCCV prior to discharge -Consider digoxin if needed  2. Acute systolic CHF: -Has had chronic LE swelling, only sits with legs down, will not elevate them -Continued faint bibasilar crackles noted on exam -Restart PO Lasix 40 mg daily -Coreg as above -Add losartan 25 mg daily, consider Entresto as an outpatient -Consider spironolactone prior to discharge  -For nuclear stress test today  3. Elevated troponin: -No symptoms concerning for ischemia -Pharmacological stress test above -Eliquis in place of ASA  4. HTN: -Titrate Coreg as above -Add losartan as above  5. HLD: -Statin  6. AKI: -Improving   Signed, Marcille Blanco [place] Pager: [phone] 03/02/2017, 8:29 AM

## 2017-03-02 NOTE — Progress Notes (Signed)
Physical Therapy Treatment Patient Details Name: Stephanie Contreras MRN: [id number] DOB: 1928/12/14 Today's Date: 03/02/2017    History of Present Illness 83 yo female with onset of CAP and SOB was admitted, has exacerbation of a-fib and new onset CHF.  PMHx:  HTN, A-fib, hypothyroidism    PT Comments    Pt in bed, awaiting stress test but wanting to walk.  Bed mobility improved this am.  She was able to complete with HOB up, rail and increased time but no direct physical assist.  Ambulated to bathroom with min assist then continued to ambulate around unit x 1 with walker and overall unsteady gait.  Pt reports general weakness and instability from not moving much the past few days.  HR monitored and initially was within limits she increased to 150 at end of gait.  While pt requested to continue to ambulate, she was directed back to her bed to rest.  HR decreased to 112 upon sitting and 103 before I left the room.  Primary nurse aware.  Daughter in room for session.    Follow Up Recommendations  SNF     Equipment Recommendations  None recommended by PT    Recommendations for Other Services       Precautions / Restrictions Precautions Precautions: Fall Precaution Comments: monitor HR Restrictions Weight Bearing Restrictions: No    Mobility  Bed Mobility Overal bed mobility: Modified Independent Bed Mobility: Supine to Sit;Sit to Supine     Supine to sit: Supervision Sit to supine: Supervision   General bed mobility comments: used rails and HOB raised but no physical assist  Transfers Overall transfer level: Needs assistance Equipment used: Rolling walker (2 wheeled) Transfers: Sit to/from Stand Sit to Stand: Min guard;Min assist         General transfer comment: reminders for  hand placement  Ambulation/Gait   Ambulation Distance (Feet): 180 Feet Assistive device: Rolling walker (2 wheeled) Gait Pattern/deviations: Step-through pattern;Narrow base of support   Gait  velocity interpretation: Below normal speed for age/gender General Gait Details: generally unsteady, narrow BOS today, HR inc to 150 with gait, dec to 112 at rest    Stairs            Wheelchair Mobility    Modified Rankin (Stroke Patients Only)       Balance Overall balance assessment: Needs assistance Sitting-balance support: Feet supported Sitting balance-Leahy Scale: Good     Standing balance support: Bilateral upper extremity supported Standing balance-Leahy Scale: Poor                              Cognition Arousal/Alertness: Awake/alert Behavior During Therapy: WFL for tasks assessed/performed Overall Cognitive Status: Within Functional Limits for tasks assessed                                 General Comments: Pt reports some anxiety over unkown health      Exercises      General Comments        Pertinent Vitals/Pain Pain Assessment: No/denies pain    Home Living                      Prior Function            PT Goals (current goals can now be found in the care plan section) Progress towards PT goals: Progressing toward  goals    Frequency    Min 2X/week      PT Plan Current plan remains appropriate    Co-evaluation              AM-PAC PT "6 Clicks" Daily Activity  Outcome Measure  Difficulty turning over in bed (including adjusting bedclothes, sheets and blankets)?: None Difficulty moving from lying on back to sitting on the side of the bed? : None Difficulty sitting down on and standing up from a chair with arms (e.g., wheelchair, bedside commode, etc,.)?: A Little Help needed moving to and from a bed to chair (including a wheelchair)?: A Little Help needed walking in hospital room?: A Little Help needed climbing 3-5 steps with a railing? : A Lot 6 Click Score: 19    End of Session Equipment Utilized During Treatment: Gait belt Activity Tolerance: Patient tolerated treatment  well;Treatment limited secondary to medical complications (Comment) Patient left: in bed;with bed alarm set;with call bell/phone within reach;with family/visitor present Nurse Communication: Other (comment)       Time: 2683-4196 PT Time Calculation (min) (ACUTE ONLY): 10 min  Charges:  $Gait Training: 8-22 mins                    G Codes:       Chesley Noon, PTA 03/02/17, 9:11 AM

## 2017-03-03 ENCOUNTER — Inpatient Hospital Stay (HOSPITAL_BASED_OUTPATIENT_CLINIC_OR_DEPARTMENT_OTHER): Payer: MEDICARE

## 2017-03-03 ENCOUNTER — Encounter: Payer: Self-pay | Admitting: Radiology

## 2017-03-03 ENCOUNTER — Ambulatory Visit: Payer: MEDICARE | Admitting: Family Medicine

## 2017-03-03 DIAGNOSIS — I255 Ischemic cardiomyopathy: Secondary | ICD-10-CM

## 2017-03-03 DIAGNOSIS — R748 Abnormal levels of other serum enzymes: Secondary | ICD-10-CM

## 2017-03-03 LAB — BASIC METABOLIC PANEL
Anion gap: 5 (ref 5–15)
BUN: 25 mg/dL — ABNORMAL HIGH (ref 6–20)
CALCIUM: 9 mg/dL (ref 8.9–10.3)
CO2: 28 mmol/L (ref 22–32)
Chloride: 110 mmol/L (ref 101–111)
Creatinine, Ser: 0.98 mg/dL (ref 0.44–1.00)
GFR calc Af Amer: 59 mL/min — ABNORMAL LOW (ref >60)
GFR, EST NON AFRICAN AMERICAN: 51 mL/min — AB (ref >60)
Glucose, Bld: 98 mg/dL (ref 65–99)
Potassium: 3.9 mmol/L (ref 3.5–5.1)
SODIUM: 143 mmol/L (ref 135–145)

## 2017-03-03 LAB — NM MYOCAR MULTI W/SPECT W/WALL MOTION / EF
CSEPPHR: 116 {beats}/min
LV dias vol: 124 mL (ref 46–106)
LV sys vol: 73 mL
NUC STRESS TID: 1.05
Rest HR: 104 {beats}/min

## 2017-03-03 MED ORDER — HEPARIN (PORCINE) IN NACL 100-0.45 UNIT/ML-% IJ SOLN: 1100.0000 [IU]/h | INTRAMUSCULAR | Status: DC

## 2017-03-03 MED ORDER — TECHNETIUM TC 99M TETROFOSMIN IV KIT
13.3000 | PACK | Freq: Once | INTRAVENOUS | Status: AC | PRN
  Administered 2017-03-03: 13.3 via INTRAVENOUS

## 2017-03-03 MED ORDER — REGADENOSON 0.4 MG/5ML IV SOLN
0.4000 mg | Freq: Once | INTRAVENOUS | Status: AC
  Administered 2017-03-03: 0.4 mg via INTRAVENOUS

## 2017-03-03 MED ORDER — TECHNETIUM TC 99M TETROFOSMIN IV KIT
31.1700 | PACK | Freq: Once | INTRAVENOUS | Status: AC | PRN
  Administered 2017-03-03: 31.17 via INTRAVENOUS

## 2017-03-03 MED ORDER — FUROSEMIDE 40 MG PO TABS
60.0000 mg | ORAL_TABLET | Freq: Two times a day (BID) | ORAL | Status: DC
  Administered 2017-03-03 – 2017-03-06 (×5): 60 mg via ORAL
  Filled 2017-03-03 (×5): qty 1

## 2017-03-03 MED ORDER — POLYETHYLENE GLYCOL 3350 17 G PO PACK
17.0000 g | PACK | Freq: Every day | ORAL | Status: DC
  Administered 2017-03-03 – 2017-03-06 (×3): 17 g via ORAL
  Filled 2017-03-03 (×3): qty 1

## 2017-03-03 MED ORDER — BISACODYL 5 MG PO TBEC: 5.0000 mg | DELAYED_RELEASE_TABLET | Freq: Every day | ORAL | Status: DC | PRN

## 2017-03-03 MED ORDER — CARVEDILOL 25 MG PO TABS
25.0000 mg | ORAL_TABLET | Freq: Two times a day (BID) | ORAL | Status: DC
Start: 2017-03-03 — End: 2017-03-06
  Administered 2017-03-03 – 2017-03-06 (×5): 25 mg via ORAL
  Filled 2017-03-03 (×5): qty 1

## 2017-03-03 NOTE — Progress Notes (Signed)
[place] at [place] NAME: Stephanie Contreras    MR#:  [id number]  DATE OF BIRTH:  January 03, 1929  SUBJECTIVE:  CHIEF COMPLAINT:   Chief Complaint  Patient presents with  . Shortness of Breath  . Emesis    Still has some SOB. Afib with RVR  REVIEW OF SYSTEMS:  CONSTITUTIONAL: No fever, fatigue or weakness.  EYES: No blurred or double vision.  EARS, NOSE, AND THROAT: No tinnitus or ear pain.  RESPIRATORY: No cough,Positive for shortness of breath, no wheezing or hemoptysis.  CARDIOVASCULAR: No chest pain, orthopnea, edema.  GASTROINTESTINAL: No nausea, vomiting, diarrhea or abdominal pain.  GENITOURINARY: No dysuria, hematuria.  ENDOCRINE: No polyuria, nocturia,  HEMATOLOGY: No anemia, easy bruising or bleeding SKIN: No rash or lesion. MUSCULOSKELETAL: No joint pain or arthritis.   NEUROLOGIC: No tingling, numbness, weakness.  PSYCHIATRY: No anxiety or depression.   ROS  DRUG ALLERGIES:  No Known Allergies  VITALS:  Blood pressure (!) 133/102, pulse 88, temperature 98.4 F (36.9 C), temperature source Oral, resp. rate 20, height 5\' 7"  (1.702 m), weight 84.3 kg (185 lb 12.8 oz), SpO2 91 %.  PHYSICAL EXAMINATION:  GENERAL:  83 y.o.-year-old patient lying in the bed with no acute distress.  EYES: Pupils equal, round, reactive to light and accommodation. No scleral icterus. Extraocular muscles intact.  HEENT: Head atraumatic, normocephalic. Oropharynx and nasopharynx clear.  NECK:  Supple, no jugular venous distention. No thyroid enlargement, no tenderness.  LUNGS: Bilateral crackles. No use of accessory muscles of respiration.  CARDIOVASCULAR: S1, S2 normal. No murmurs, rubs, or gallops.  ABDOMEN: Soft, nontender, nondistended. Bowel sounds present. No organomegaly or mass.  EXTREMITIES: Some pedal edema, no cyanosis, or clubbing.  NEUROLOGIC: Cranial nerves II through XII are intact. Muscle strength 4/5 in all extremities. Sensation  intact. Gait not checked.  PSYCHIATRIC: The patient is alert and oriented x 3 SKIN: No obvious rash, lesion, or ulcer.   Physical Exam LABORATORY PANEL:   CBC  Recent Labs Lab 03/02/17 0508  WBC 10.9  HGB 11.6*  HCT 34.9*  PLT 325   ------------------------------------------------------------------------------------------------------------------  Chemistries   Recent Labs Lab 03/03/17 0448  NA 143  K 3.9  CL 110  CO2 28  GLUCOSE 98  BUN 25*  CREATININE 0.98  CALCIUM 9.0   ------------------------------------------------------------------------------------------------------------------  Cardiac Enzymes  Recent Labs Lab 02/28/17 0636 02/28/17 1311  TROPONINI 0.03* 0.06*   ------------------------------------------------------------------------------------------------------------------  RADIOLOGY:  Nm Myocar Multi W/spect W/wall Motion / Ef  Result Date: 03/03/2017  High risk myocardial perfusion stress test.  There is moderate in size, moderate in severity, partially reversible defect involving the inferolateral myocardial consistent with scar and periinfarct ischemia.  The is a small in size, moderate in severity, fixed apical defect most likely representing apical thinning and/or artifact and less likely scar.  The left ventricular ejection fraction is severely decreased (<30%).     ASSESSMENT AND PLAN:   Principal Problem:   CAP (community acquired pneumonia) Active Problems:   Essential hypertension   Hyperlipidemia   Hypothyroidism   Acute systolic CHF (congestive heart failure) (HCC)   Atrial fibrillation (HCC)  *  Acute on chronic systolic CHF (congestive heart failure) EF 35-40%  Increase lasix to 40mg  BID Stress test with high risk study and reversible ischemia. Eliquis held. Heparin drip. Cath on Thursday.  * CAP (community acquired pneumonia) - IV antibiotics, when necessary supportive medications.   Had severe nausea with Levaquin  orally as outpatient.  Switched to rocephin + azithromycin. Stop today. Finished 5 days total.  *  Essential hypertension - elevated, continue home meds, use additional when necessary antihypertensives  *  Atrial fibrillation ([place]) - continue home rate controlling medications and anticoagulation.  *  Hyperlipidemia - continue home meds  *  Hypothyroidism - home dose thyroid replacement  All the records are reviewed and case discussed with Care Management/Social Worker Management plans discussed with the patient, family and they are in agreement.  CODE STATUS: Full code.  TOTAL TIME TAKING CARE OF THIS PATIENT: 35 minutes.   Discussed with patient's granddaughter in the room.  POSSIBLE D/C IN 1-2 DAYS, DEPENDING ON CLINICAL CONDITION.  Hillary Bow R M.D on 03/03/2017   Between 7am to 6pm - Pager - [phone]  After 6pm go to [email or web address] - password [email or web address] [facility]  Office  [phone]  CC: Primary care physician; Coral Spikes, DO  Note: This dictation was prepared with Dragon dictation along with smaller phrase technology. Any transcriptional errors that result from this process are unintentional.

## 2017-03-03 NOTE — Clinical Social Work Placement (Signed)
   CLINICAL SOCIAL WORK PLACEMENT  NOTE  Date:  03/03/2017  Patient Details  Name: Stephanie Contreras MRN: [id number] Date of Birth: [birth year]  Clinical Social Work is seeking post-discharge placement for this patient at the [place] level of care (*CSW will initial, date and re-position this form in  chart as items are completed):  Yes   Patient/family provided with [facility] Work Department's list of facilities offering this level of care within the geographic area requested by the patient (or if unable, by the patient's family).  Yes   Patient/family informed of their freedom to choose among providers that offer the needed level of care, that participate in Medicare, Medicaid or managed care program needed by the patient, have an available bed and are willing to accept the patient.  Yes   Patient/family informed of [facility]'s ownership interest in [facility] and [facility], as well as of the fact that they are under no obligation to receive care at these facilities.  PASRR submitted to EDS on 03/03/17     PASRR number received on 03/03/17     Existing PASRR number confirmed on       FL2 transmitted to all facilities in geographic area requested by pt/family on 03/03/17     FL2 transmitted to all facilities within larger geographic area on 03/03/17     Patient informed that his/her managed care company has contracts with or will negotiate with certain facilities, including the following:            Patient/family informed of bed offers received.  Patient chooses bed at       Physician recommends and patient chooses bed at      Patient to be transferred to   on  .  Patient to be transferred to facility by       Patient family notified on   of transfer.  Name of family member notified:        PHYSICIAN Please sign FL2     Additional Comment:    _______________________________________________ Ross Ludwig,  LCSWA 03/03/2017, 10:22 AM

## 2017-03-03 NOTE — Plan of Care (Signed)
Problem: Safety: Goal: Ability to remain free from injury will improve Outcome: Progressing Patient is compliant with safety measures

## 2017-03-03 NOTE — Progress Notes (Signed)
PT Cancellation Note  Patient Details Name: Stephanie Contreras MRN: [id number] DOB: 1929-07-07   Cancelled Treatment:    Reason Eval/Treat Not Completed: Patient at procedure or test/unavailable   Session attempted x 3 this am, pt at test/procedure x 2 then with nursing care x 1.  Will continue as appropriate.   Chesley Noon 03/03/2017, 11:28 AM

## 2017-03-03 NOTE — NC FL2 (Signed)
[place] LEVEL OF CARE SCREENING TOOL     IDENTIFICATION  Patient Name: Stephanie Contreras Birthdate: [birth year] Sex: female Admission Date (Current Location): 02/27/2017  [place] and [place] Number:  Engineering geologist and Address:  [facility], [address], [place], [place] [address]      Provider Number: [id number]  Attending Physician Name and Address:  Hillary Bow, MD  Relative Name and Phone Number:  Romie Jumper [phone] or Reynold Bowen   [phone]     Current Level of Care: Hospital Recommended Level of Care: [place] Prior Approval Number:    Date Approved/Denied:   PASRR Number: [id number]  Discharge Plan: SNF    Current Diagnoses: Patient Active Problem List   Diagnosis Date Noted  . Acute systolic CHF (congestive heart failure) ([place]) 02/27/2017  . CAP (community acquired pneumonia) 02/27/2017  . Atrial fibrillation ([place]) 02/27/2017  . OSA (obstructive sleep apnea) 02/16/2017  . Hypercalcemia 02/10/2017  . History of breast cancer 01/16/2017  . Lymphedema 01/16/2017  . Essential hypertension 01/16/2017  . Hyperlipidemia 01/16/2017  . Hypothyroidism 01/16/2017  . Venous stasis 01/16/2017  . Glaucoma 01/16/2017  . New onset atrial fibrillation ([place]) 01/16/2017    Orientation RESPIRATION BLADDER Height & Weight     Self, Time, Situation, Place  Normal Continent Weight: 185 lb 12.8 oz (84.3 kg) Height:  5\' 7"  (170.2 cm)  BEHAVIORAL SYMPTOMS/MOOD NEUROLOGICAL BOWEL NUTRITION STATUS      Continent Diet (Cardiac Diet)  AMBULATORY STATUS COMMUNICATION OF NEEDS Skin   Limited Assist Verbally Normal                       Personal Care Assistance Level of Assistance  Bathing, Feeding, Dressing Bathing Assistance: Limited assistance Feeding assistance: Independent Dressing Assistance: Limited assistance     Functional Limitations Info  Sight, Hearing,  Speech Sight Info: Adequate Hearing Info: Adequate Speech Info: Adequate    SPECIAL CARE FACTORS FREQUENCY  PT (By licensed PT)     PT Frequency: 5x a week              Contractures Contractures Info: Not present    Additional Factors Info  Code Status, Allergies Code Status Info: Full Code Allergies Info: NKA           Current Medications (03/03/2017):  This is the current hospital active medication list Current Facility-Administered Medications  Medication Dose Route Frequency Provider Last Rate Last Dose  . acetaminophen (TYLENOL) tablet 650 mg  650 mg Oral Q6H PRN Lance Coon, MD       Or  . acetaminophen (TYLENOL) suppository 650 mg  650 mg Rectal Q6H PRN Lance Coon, MD      . apixaban Arne Cleveland) tablet 5 mg  5 mg Oral BID Lance Coon, MD   5 mg at 03/02/17 2214  . azithromycin (ZITHROMAX) tablet 250 mg  250 mg Oral Daily Henreitta Leber, MD   250 mg at 03/02/17 0916  . benzonatate (TESSALON) capsule 200 mg  200 mg Oral TID PRN Lance Coon, MD      . brimonidine (ALPHAGAN) 0.2 % ophthalmic solution 1 drop  1 drop Both Eyes BID Vaughan Basta, MD   1 drop at 03/02/17 2214  . carvedilol (COREG) tablet 12.5 mg  12.5 mg Oral BID WC Hillary Bow, MD   12.5 mg at 03/02/17 1709  . cefTRIAXone (ROCEPHIN) 1 g in dextrose 5 % 50 mL IVPB  1 g  Intravenous Q24H Merilyn Baba, [place]   Stopped at 03/02/17 1738  . feeding supplement (ENSURE ENLIVE) (ENSURE ENLIVE) liquid 237 mL  237 mL Oral BID BM Sudini, Srikar, MD   237 mL at 03/02/17 1115  . furosemide (LASIX) tablet 40 mg  40 mg Oral BID Kathlyn Sacramento A, MD   40 mg at 03/02/17 1708  . guaiFENesin-dextromethorphan (ROBITUSSIN DM) 100-10 MG/5ML syrup 5 mL  5 mL Oral Q4H PRN Lance Coon, MD      . ipratropium-albuterol (DUONEB) 0.5-2.5 (3) MG/3ML nebulizer solution 3 mL  3 mL Nebulization Q4H PRN Lance Coon, MD      . ketorolac (TORADOL) tablet 10 mg  10 mg Oral Q8H PRN Vaughan Basta, MD   10 mg at  02/28/17 1234  . labetalol (NORMODYNE,TRANDATE) injection 10 mg  10 mg Intravenous Q2H PRN Lance Coon, MD   10 mg at 02/28/17 0020  . levothyroxine (SYNTHROID, LEVOTHROID) tablet 125 mcg  125 mcg Oral QAC breakfast Lance Coon, MD   125 mcg at 03/03/17 0757  . losartan (COZAAR) tablet 25 mg  25 mg Oral Daily Christell Faith M, PA-C   25 mg at 03/03/17 0805  . ondansetron (ZOFRAN) tablet 4 mg  4 mg Oral Q6H PRN Lance Coon, MD       Or  . ondansetron [facility]) injection 4 mg  4 mg Intravenous Q6H PRN Lance Coon, MD   4 mg at 02/28/17 1234  . potassium chloride SA (K-DUR,KLOR-CON) CR tablet 20 mEq  20 mEq Oral Daily Christell Faith M, PA-C   20 mEq at 03/02/17 1607  . senna (SENOKOT) tablet 8.6 mg  1 tablet Oral Daily PRN Vaughan Basta, MD   8.6 mg at 02/28/17 1504  . simvastatin (ZOCOR) tablet 40 mg  40 mg Oral Daily Lance Coon, MD   40 mg at 03/02/17 2214     Discharge Medications: Please see discharge summary for a list of discharge medications.  Relevant Imaging Results:  Relevant Lab Results:   Additional Information SSN [id number]  Ross Ludwig, [place]

## 2017-03-03 NOTE — Progress Notes (Signed)
Patient Name: Stephanie Contreras Date of Encounter: 03/03/2017  Primary Cardiologist: [facility] Problem List     Principal Problem:   CAP (community acquired pneumonia) Active Problems:   Essential hypertension   Hyperlipidemia   Hypothyroidism   Acute systolic CHF (congestive heart failure) (HCC)   Atrial fibrillation (HCC)     Subjective   No chest pain. Mild shortness of breath and orthopnea, but overall feels better than yesterday. Nausea has resolved.  Inpatient Medications    Scheduled Meds: . apixaban  5 mg Oral BID  . azithromycin  250 mg Oral Daily  . brimonidine  1 drop Both Eyes BID  . carvedilol  25 mg Oral BID WC  . feeding supplement (ENSURE ENLIVE)  237 mL Oral BID BM  . furosemide  40 mg Oral BID  . levothyroxine  125 mcg Oral QAC breakfast  . losartan  25 mg Oral Daily  . potassium chloride  20 mEq Oral Daily  . simvastatin  40 mg Oral Daily   Continuous Infusions: . cefTRIAXone (ROCEPHIN) IVPB 1 gram/50 mL D5W Stopped (03/02/17 1738)   PRN Meds: acetaminophen **OR** acetaminophen, benzonatate, guaiFENesin-dextromethorphan, ipratropium-albuterol, ketorolac, labetalol, ondansetron **OR** ondansetron (ZOFRAN) IV, senna   Vital Signs    Vitals:   03/03/17 0501 03/03/17 0755 03/03/17 1101 03/03/17 1142  BP: (!) 164/96 (!) 167/90 (!) 146/95 (!) 133/102  Pulse: (!) 103 87 (!) 108 88  Resp: 17 18  20   Temp: 98.4 F (36.9 C) 98.6 F (37 C)  98.4 F (36.9 C)  TempSrc: Oral Oral  Oral  SpO2: 95% 95% 94% 91%  Weight:      Height:        Intake/Output Summary (Last 24 hours) at 03/03/17 1302 Last data filed at 03/02/17 1826  Gross per 24 hour  Intake              360 ml  Output              250 ml  Net              110 ml   Filed Weights   03/01/17 0446 03/02/17 0407 03/03/17 0500  Weight: 185 lb 9.6 oz (84.2 kg) 187 lb 14.4 oz (85.2 kg) 185 lb 12.8 oz (84.3 kg)    Physical Exam    GEN: Elderly woman, seated comfortably in bed. Her  granddaughter is at the bedside. HEENT: Grossly normal.  Neck: Supple without lymphadenopathy, thyromegaly, or significant JVD Cardiac: Irregularly irregular, no murmurs, rubs, or gallops. No clubbing, cyanosis, trace pre-tibial edema bilaterally.  Radials/DP/PT 2+ and equal bilaterally.  Respiratory:  Diminished breath sounds bilaterally with faint bibasilar crackles. GI: Soft, nontender, nondistended, BS + x 4. MS: no deformity or atrophy. Skin: warm and dry, no rash. Neuro:  Strength and sensation are intact. Psych: AAOx3.  Normal affect.  Labs    CBC  Recent Labs  03/02/17 0508  WBC 10.9  HGB 11.6*  HCT 34.9*  MCV 88.5  PLT 902   Basic Metabolic Panel  Recent Labs  03/02/17 0508 03/03/17 0448  NA 143 143  K 3.8 3.9  CL 110 110  CO2 26 28  GLUCOSE 104* 98  BUN 24* 25*  CREATININE 1.10* 0.98  CALCIUM 9.1 9.0   Liver Function Tests No results for input(s): AST, ALT, ALKPHOS, BILITOT, PROT, ALBUMIN in the last 72 hours. No results for input(s): LIPASE, AMYLASE in the last 72 hours. Cardiac Enzymes  Recent  Labs  02/28/17 1311  TROPONINI 0.06*   BNP Invalid input(s): POCBNP D-Dimer No results for input(s): DDIMER in the last 72 hours. Hemoglobin A1C No results for input(s): HGBA1C in the last 72 hours. Fasting Lipid Panel No results for input(s): CHOL, HDL, LDLCALC, TRIG, CHOLHDL, LDLDIRECT in the last 72 hours. Thyroid Function Tests No results for input(s): TSH, T4TOTAL, T3FREE, THYROIDAB in the last 72 hours.  Invalid input(s): FREET3  Telemetry    Atrial fibrillation with occasional PVCs versus aberrancy. Heart rate 90-130 bpm - Personally Reviewed  ECG    n/a - Personally Reviewed  Radiology    Nm Myocar Multi W/spect W/wall Motion / Ef  Result Date: 03/03/2017  High risk myocardial perfusion stress test.  There is moderate in size, moderate in severity, partially reversible defect involving the inferolateral myocardial consistent with  scar and periinfarct ischemia.  The is a small in size, moderate in severity, fixed apical defect most likely representing apical thinning and/or artifact and less likely scar.  The left ventricular ejection fraction is severely decreased (<30%).     Cardiac Studies   TTE 02/28/2017: Study Conclusions  - Left ventricle: The cavity size was normal. Wall thickness was   increased in a pattern of mild LVH. Systolic function was   moderately reduced. The estimated ejection fraction was in the   range of 35% to 40%. Diffuse hypokinesis. The study is not   technically sufficient to allow evaluation of LV diastolic   function. - Aortic valve: There was moderate stenosis by velocity ratio, in   setting of reduced ejection fraction/atrial fibrillation.   Recommend to reassess serially. - Mitral valve: There was mild regurgitation. - Left atrium: The atrium was severely dilated. - Right atrium: The atrium was mildly dilated. - Tricuspid valve: There was moderate regurgitation. - Pulmonary arteries: Systolic pressure was moderately increased.   PA peak pressure: 49 mm Hg (S).  Nuclear stress test pending  Patient Profile     83 y.o. female with history of acute systolic CHF, breast cancer, hypothyroidism, and HLD who was noted to be in new onset Afib with RVR in the setting of CAP.   Assessment & Plan    1. New onset Afib with RVR: Heart rate remains elevated, though slightly better than yesterday. -Increased carvedilol to 25 mg twice a day. -If heart rate remains suboptimally controlled, would recommend initiation of digoxin. Avoid calcium channel blockers given cardiomyopathy. -Discontinue L at [facility] and initiate heparin infusion tonight in anticipation of possible cardiac catheterization later this week.  2. Acute systolic CHF: Mild lower extremity edema persists. Eyes and nose not completely recorded yesterday. Weight is down 2 pounds from yesterday but not significant change since  admission. Myocardial perfusion stress test reveals significantly reduced LV contraction with inferolateral scar and peri-infarct ischemia, consistent with a high risk study. -Increased carvedilol to 25 mg twice a day, as above. -Continue losartan and furosemide at current doses. -Given high risk stress test, mild troponin elevation, and chest pain at the time of admission, I am concerned for ischemic cardiomyopathy. I spoke with the patient and her granddaughter regarding further workup, including medical management and coronary angiography. The patient is interested in cardiac catheterization but would like to discuss this further with her son, who lives in [place]. We will hold Eliquis the time being and initiate heparin infusion tomorrow morning for stroke prophylaxis in the setting of atrial fibrillation. If she is agreeable to proceeding with cardiac catheterization, this will likely need  to be done Friday morning, given that she received Eliquis this morning  3. Elevated troponin: Chest tightness reported time of admission. Myocardial perfusion stress test today suggestive of inferolateral infarct with peri-infarct ischemia. Severely reduced LVEF noted. -Anticipate LHC later this week, if patient remains agreeable. -Initiate aspirin 81 mg daily. -Heparin infusion in place of Eliquis (see details above). -continue simvastatin; LDL in 12/2016 was 73.  4. HTN: -Carvedilol and losartan, as above  5. HLD: -Statin  6. AKI: -Improving   7. Pneumonia -Continue antimicrobial therapy per internal medicine service.  Signed, Nelva Bush, MD [facility] Pager: [phone] 03/03/2017, 1:02 PM

## 2017-03-03 NOTE — Clinical Social Work Note (Signed)
Clinical Social Work Assessment  Patient Details  Name: Stephanie Contreras MRN: [id number] Date of Birth: May 29, 1929  Date of referral:  03/03/17               Reason for consult:  Facility Placement                Permission sought to share information with:  Facility Sport and exercise psychologist, Family Supports Permission granted to share information::  Yes, Verbal Permission Granted  Name::     Stephanie Contreras [phone]  [phone] or [place]::  SNF admissions  Relationship::     Contact Information:     Housing/Transportation Living arrangements for the past 2 months:  Single Family Home Source of Information:   (Patient's grandaughter.) Patient Interpreter Needed:  None Criminal Activity/Legal Involvement Pertinent to Current Situation/Hospitalization:  No - Comment as needed Significant Relationships:  Adult Children, Other Family Members Lives with:  Adult Children, Relatives Do you feel safe going back to the place where you live?  No Need for family participation in patient care:  Yes (Comment)  Care giving concerns:  Patient and her family feel she needs some short term rehab before she is able to return back home.   Social Worker assessment / plan:  Patient is a 83 year old female who lives with her daughter and grandaughter.  Patient was at procedure, assessment completed with granddaughter Stephanie Contreras who was in patient's room.  Patient is alert and oriented x4 and able to make her own decisions.  Patient's granddaughter expressed that patient typically walks with her walker around the house, and she has someone with her at all times.  Patient's grandaughter expressed that patient is typically pretty active at home.  CSW exlplained to patient's granddaughter what to expect at SNF and how insurance will pay for the stay.  CSW explained role of CSW and process for looking for SNF placement.  Patient's granddaughter did not express any  other questions or concerns, and gave CSW permission to begin bed search process in [place] and [place].  Employment status:  Retired Forensic scientist:  Medicare PT Recommendations:  [place] / Referral to community resources:  [place]  Patient/Family's Response to care:  Patient and family are agreeable to going to SNF for short term rehab.  Patient/Family's Understanding of and Emotional Response to Diagnosis, Current Treatment, and Prognosis:  Patient's family is aware of current treatment plan and diagnosis.  Emotional Assessment Appearance:  Appears stated age Attitude/Demeanor/Rapport:    Affect (typically observed):  Appropriate, Calm Orientation:  Oriented to Self, Oriented to Place, Oriented to  Time, Oriented to Situation Alcohol / Substance use:  Not Applicable Psych involvement (Current and /or in the community):  No (Comment)  Discharge Needs  Concerns to be addressed:  Lack of Support Readmission within the last 30 days:  No Current discharge risk:  Lack of support system Barriers to Discharge:  Continued Medical Work up   Ross Ludwig, [place] 03/03/2017, 10:04 AM

## 2017-03-03 NOTE — Progress Notes (Signed)
ANTICOAGULATION CONSULT NOTE - Initial Consult  Pharmacy Consult for Heparin Indication: atrial fibrillation  No Known Allergies  Patient Measurements: Height: 5\' 7"  (170.2 cm) Weight: 185 lb 12.8 oz (84.3 kg) IBW/kg (Calculated) : 61.6 Heparin Dosing Weight: 79 kg  Vital Signs: Temp: 98.4 F (36.9 C) (05/08 1142) Temp Source: Oral (05/08 1142) BP: 133/102 (05/08 1142) Pulse Rate: 88 (05/08 1142)  Labs:  Recent Labs  03/01/17 0547 03/02/17 0508 03/03/17 0448  HGB  --  11.6*  --   HCT  --  34.9*  --   PLT  --  325  --   CREATININE 1.34* 1.10* 0.98    Estimated Creatinine Clearance: 46 mL/min (by C-G formula based on SCr of 0.98 mg/dL).   Medical History: Past Medical History:  Diagnosis Date  . Atrial fibrillation ([place])   . Breast cancer ([place])    Right  . Essential hypertension 01/16/2017  . Frequent headaches   . Hyperlipidemia 01/16/2017  . Hypothyroidism 01/16/2017  . Kidney stones   . Lymphedema 01/16/2017    Medications:  Infusions:  . [START ON 03/04/2017] heparin      Assessment: 88 yof cc SOB/emesis - patient is on Eliquis PTA for AF. Pharmacy consulted to bridge with heparin d/t possible cardiac cath later in the week. Last Eliquis dose documented 03/03/17 at 1106. Consult asks to start heparin drip 03/04/17 at 0600.   Goal of Therapy:  APTT 66 to 102 seconds Heparin level 0.3-0.7 units/ml Monitor platelets by anticoagulation protocol: Yes   Plan:  Start heparin infusion at 1100 units/hr Check anti-Xa level in 8 hours and daily while on heparin Continue to monitor H&H and platelets  Laural Benes, Pharm.D., BCPS Clinical Pharmacist 03/03/2017,4:33 PM

## 2017-03-03 NOTE — Discharge Instructions (Signed)
Heart Failure Clinic appointment on Mar 12, 2017 at 9:20am with Stephanie Contreras, [place]. Please call [phone] to reschedule.

## 2017-03-04 ENCOUNTER — Encounter: Payer: Self-pay | Admitting: *Deleted

## 2017-03-04 LAB — CBC
HEMATOCRIT: 34.3 % — AB (ref 35.0–47.0)
Hemoglobin: 11.4 g/dL — ABNORMAL LOW (ref 12.0–16.0)
MCH: 30 pg (ref 26.0–34.0)
MCHC: 33.1 g/dL (ref 32.0–36.0)
MCV: 90.6 fL (ref 80.0–100.0)
Platelets: 300 10*3/uL (ref 150–440)
RBC: 3.78 MIL/uL — AB (ref 3.80–5.20)
RDW: 15.2 % — AB (ref 11.5–14.5)
WBC: 8.7 10*3/uL (ref 3.6–11.0)

## 2017-03-04 LAB — PROTIME-INR
INR: 1.6
Prothrombin Time: 19.2 seconds — ABNORMAL HIGH (ref 11.4–15.2)

## 2017-03-04 LAB — CULTURE, BLOOD (ROUTINE X 2)
CULTURE: NO GROWTH
CULTURE: NO GROWTH
SPECIAL REQUESTS: ADEQUATE
Special Requests: ADEQUATE

## 2017-03-04 LAB — HEPARIN LEVEL (UNFRACTIONATED)
HEPARIN UNFRACTIONATED: 2.48 [IU]/mL — AB (ref 0.30–0.70)
Heparin Unfractionated: 1.82 IU/mL — ABNORMAL HIGH (ref 0.30–0.70)

## 2017-03-04 LAB — APTT
aPTT: 42 seconds — ABNORMAL HIGH (ref 24–36)
aPTT: 59 seconds — ABNORMAL HIGH (ref 24–36)

## 2017-03-04 MED ORDER — HEPARIN (PORCINE) IN NACL 100-0.45 UNIT/ML-% IJ SOLN
1100.0000 [IU]/h | INTRAMUSCULAR | Status: DC
  Administered 2017-03-04: 1000 [IU]/h via INTRAVENOUS
  Administered 2017-03-05: 1100 [IU]/h via INTRAVENOUS
  Filled 2017-03-04 (×2): qty 250

## 2017-03-04 MED ORDER — SODIUM CHLORIDE 0.9% FLUSH: 3.0000 mL | INTRAVENOUS | Status: DC | PRN

## 2017-03-04 MED ORDER — SODIUM CHLORIDE 0.9% FLUSH: 3.0000 mL | Freq: Two times a day (BID) | INTRAVENOUS | Status: DC

## 2017-03-04 MED ORDER — SODIUM CHLORIDE 0.9 % IV SOLN
INTRAVENOUS | Status: DC
  Administered 2017-03-05: 05:00:00 via INTRAVENOUS

## 2017-03-04 MED ORDER — ASPIRIN 81 MG PO CHEW
81.0000 mg | CHEWABLE_TABLET | ORAL | Status: AC
  Administered 2017-03-05: 81 mg via ORAL
  Filled 2017-03-04: qty 1

## 2017-03-04 MED ORDER — ASPIRIN EC 81 MG PO TBEC
81.0000 mg | DELAYED_RELEASE_TABLET | Freq: Every day | ORAL | Status: DC
  Administered 2017-03-04 – 2017-03-06 (×2): 81 mg via ORAL
  Filled 2017-03-04 (×2): qty 1

## 2017-03-04 MED ORDER — SODIUM CHLORIDE 0.9 % IV SOLN: 250.0000 mL | INTRAVENOUS | Status: DC | PRN

## 2017-03-04 NOTE — Progress Notes (Signed)
ANTICOAGULATION CONSULT NOTE - Initial Consult  Pharmacy Consult for Heparin Indication: atrial fibrillation  No Known Allergies  Patient Measurements: Height: 5\' 7"  (170.2 cm) Weight: 185 lb 12.8 oz (84.3 kg) IBW/kg (Calculated) : 61.6 Heparin Dosing Weight: 79 kg  Vital Signs: Temp: 98.4 F (36.9 C) (05/08 1142) Temp Source: Oral (05/08 1142) BP: 133/102 (05/08 1142) Pulse Rate: 88 (05/08 1142)  Labs:  Recent Labs (last 2 labs)    Recent Labs  03/01/17 0547 03/02/17 0508 03/03/17 0448  HGB  --  11.6*  --   HCT  --  34.9*  --   PLT  --  325  --   CREATININE 1.34* 1.10* 0.98      Estimated Creatinine Clearance: 46 mL/min (by C-G formula based on SCr of 0.98 mg/dL).   Medical History:     Past Medical History:  Diagnosis Date  . Atrial fibrillation ([place])   . Breast cancer ([place])    Right  . Essential hypertension 01/16/2017  . Frequent headaches   . Hyperlipidemia 01/16/2017  . Hypothyroidism 01/16/2017  . Kidney stones   . Lymphedema 01/16/2017    Medications:  Infusions:  . [START ON 03/04/2017] heparin      Assessment: 88 yof cc SOB/emesis - patient is on Eliquis PTA for AF. Pharmacy consulted to bridge with heparin d/t possible cardiac cath later in the week. Last Eliquis dose documented 03/03/17 at 1106. Consult asks to start heparin drip 03/04/17 at 0600.   Goal of Therapy:  APTT 66 to 102 seconds Heparin level 0.3-0.7 units/ml Monitor platelets by anticoagulation protocol: Yes   Plan:  Start heparin infusion at 1000 units/hr Check anti-Xa level in 8 hours and daily while on heparin Continue to monitor H&H and platelets  Thank you for this consult.  Tobie Lords, PharmD, BCPS Clinical Pharmacist 03/04/2017

## 2017-03-04 NOTE — Progress Notes (Signed)
[place] [facility] [place] NAME: Stephanie Contreras    MR#:  [id number]  DATE OF BIRTH:  Oct 26, 1929  SUBJECTIVE:  CHIEF COMPLAINT:   Chief Complaint  Patient presents with  . Shortness of Breath  . Emesis   Shortness of breath is improving. Occasional chest pressure. Atrial fibrillation is better controlled. Eliquis on hold. On heparin drip. Cardiac catheterization tomorrow.  REVIEW OF SYSTEMS:  CONSTITUTIONAL: No fever, fatigue or weakness.  EYES: No blurred or double vision.  EARS, NOSE, AND THROAT: No tinnitus or ear pain.  RESPIRATORY: No cough,Positive for shortness of breath, no wheezing or hemoptysis.  CARDIOVASCULAR: No chest pain, orthopnea, edema.  GASTROINTESTINAL: No nausea, vomiting, diarrhea or abdominal pain.  GENITOURINARY: No dysuria, hematuria.  ENDOCRINE: No polyuria, nocturia,  HEMATOLOGY: No anemia, easy bruising or bleeding SKIN: No rash or lesion. MUSCULOSKELETAL: No joint pain or arthritis.   NEUROLOGIC: No tingling, numbness, weakness.  PSYCHIATRY: No anxiety or depression.   ROS  DRUG ALLERGIES:  No Known Allergies  VITALS:  Blood pressure (!) 144/88, pulse 80, temperature 98.2 F (36.8 C), temperature source Oral, resp. rate 16, height 5\' 7"  (1.702 m), weight 84 kg (185 lb 3.3 oz), SpO2 93 %.  PHYSICAL EXAMINATION:  GENERAL:  83 y.o.-year-old patient lying in the bed with no acute distress.  EYES: Pupils equal, round, reactive to light and accommodation. No scleral icterus. Extraocular muscles intact.  HEENT: Head atraumatic, normocephalic. Oropharynx and nasopharynx clear.  NECK:  Supple, no jugular venous distention. No thyroid enlargement, no tenderness.  LUNGS: Bilateral crackles. No use of accessory muscles of respiration.  CARDIOVASCULAR: S1, S2 normal. No murmurs, rubs, or gallops.  ABDOMEN: Soft, nontender, nondistended. Bowel sounds present. No organomegaly or mass.  EXTREMITIES: Some pedal edema, no  cyanosis, or clubbing.  NEUROLOGIC: Cranial nerves II through XII are intact. Muscle strength 4/5 in all extremities. Sensation intact. Gait not checked.  PSYCHIATRIC: The patient is alert and oriented x 3 SKIN: No obvious rash, lesion, or ulcer.   Physical Exam LABORATORY PANEL:   CBC  Recent Labs Lab 03/04/17 0500  WBC 8.7  HGB 11.4*  HCT 34.3*  PLT 300   ------------------------------------------------------------------------------------------------------------------  Chemistries   Recent Labs Lab 03/03/17 0448  NA 143  K 3.9  CL 110  CO2 28  GLUCOSE 98  BUN 25*  CREATININE 0.98  CALCIUM 9.0   ------------------------------------------------------------------------------------------------------------------  Cardiac Enzymes  Recent Labs Lab 02/28/17 0636 02/28/17 1311  TROPONINI 0.03* 0.06*   ------------------------------------------------------------------------------------------------------------------  RADIOLOGY:  Nm Myocar Multi W/spect W/wall Motion / Ef  Result Date: 03/03/2017  High risk myocardial perfusion stress test.  There is moderate in size, moderate in severity, partially reversible defect involving the inferolateral myocardial consistent with scar and periinfarct ischemia.  The is a small in size, moderate in severity, fixed apical defect most likely representing apical thinning and/or artifact and less likely scar.  The left ventricular ejection fraction is severely decreased (<30%).     ASSESSMENT AND PLAN:   Principal Problem:   CAP (community acquired pneumonia) Active Problems:   Essential hypertension   Hyperlipidemia   Hypothyroidism   Acute systolic CHF (congestive heart failure) (HCC)   Atrial fibrillation (HCC)  *  Acute on chronic systolic CHF (congestive heart failure) EF 35-40%  continue lasix at 40mg  BID Stress test with high risk study and reversible ischemia. Eliquis held. On Heparin drip.  Cardiac Cath  tomorrow.  * CAP (community acquired pneumonia) - IV  antibiotics, when necessary supportive medications.   Had severe nausea with Levaquin orally as outpatient.   Switched to rocephin + azithromycin. Stopped 03/03/2017. Finished 5 days total.  *  Essential hypertension - elevated, continue home meds, use additional when necessary antihypertensives  *  Atrial fibrillation ([place]) - continue home rate controlling medications and anticoagulation  *  Hyperlipidemia - continue home meds  *  Hypothyroidism - home dose thyroid replacement  All the records are reviewed and case discussed with Care Management/Social Worker Management plans discussed with the patient, family and they are in agreement.  CODE STATUS: Full code  TOTAL TIME TAKING CARE OF THIS PATIENT: 35 minutes  POSSIBLE D/C IN 1-2 DAYS, DEPENDING ON CLINICAL CONDITION.  Hillary Bow R M.D on 03/04/2017   Between 7am to 6pm - Pager - [phone]  After 6pm go to [email or web address] - password [email or web address] [place]  Office  [phone]  CC: Primary care physician; Coral Spikes, DO  Note: This dictation was prepared with Dragon dictation along with smaller phrase technology. Any transcriptional errors that result from this process are unintentional.

## 2017-03-04 NOTE — Progress Notes (Signed)
PT Cancellation Note  Patient Details Name: [place] K Mcvey MRN: [id number] DOB: [birth year]   Cancelled Treatment:    Reason Eval/Treat Not Completed: Medical issues which prohibited therapy (Per chart review, patient noted with continued reports of chest pressure; myocardial perfusion stress test consistent with inferiorlateral ischemia.  Plans for cardiac cath tomorrow to further evaluate cardiac status.  Will hold at this time and re-attempt post-procedure as medically appropriate.)   Tiara Maultsby H. Owens Shark, PT, DPT, NCS 03/04/17, 3:49 PM [phone]

## 2017-03-04 NOTE — Progress Notes (Signed)
Patient Name: Stephanie Contreras Date of Encounter: 03/04/2017  Primary Cardiologist: [facility] Problem List     Principal Problem:   CAP (community acquired pneumonia) Active Problems:   Essential hypertension   Hyperlipidemia   Hypothyroidism   Acute systolic CHF (congestive heart failure) (HCC)   Atrial fibrillation (HCC)     Subjective   No chest pain. Some continued SOB and orthopnea. Has discussed LHC with family with patient and family wanting to proceed with cardiac cath.   Inpatient Medications    Scheduled Meds: . brimonidine  1 drop Both Eyes BID  . carvedilol  25 mg Oral BID WC  . feeding supplement (ENSURE ENLIVE)  237 mL Oral BID BM  . furosemide  60 mg Oral BID  . levothyroxine  125 mcg Oral QAC breakfast  . losartan  25 mg Oral Daily  . polyethylene glycol  17 g Oral Daily  . potassium chloride  20 mEq Oral Daily  . simvastatin  40 mg Oral Daily   Continuous Infusions: . heparin 1,000 Units/hr (03/04/17 0553)   PRN Meds: acetaminophen **OR** acetaminophen, benzonatate, bisacodyl, guaiFENesin-dextromethorphan, ipratropium-albuterol, labetalol, ondansetron **OR** ondansetron (ZOFRAN) IV, senna   Vital Signs    Vitals:   03/03/17 1142 03/03/17 1659 03/03/17 1942 03/04/17 0343  BP: (!) 133/102 129/69 127/62 (!) 144/88  Pulse: 88 85 71 80  Resp: 20 18  16   Temp: 98.4 F (36.9 C)  98 F (36.7 C) 98.2 F (36.8 C)  TempSrc: Oral  Oral Oral  SpO2: 91% 94% 93% 93%  Weight:    185 lb 3.3 oz (84 kg)  Height:        Intake/Output Summary (Last 24 hours) at 03/04/17 0950 Last data filed at 03/04/17 0353  Gross per 24 hour  Intake              440 ml  Output              700 ml  Net             -260 ml   Filed Weights   03/02/17 0407 03/03/17 0500 03/04/17 0343  Weight: 187 lb 14.4 oz (85.2 kg) 185 lb 12.8 oz (84.3 kg) 185 lb 3.3 oz (84 kg)    Physical Exam    GEN: Well nourished, well developed, in no acute distress.  HEENT: Grossly  normal.  Neck: Supple, no JVD, carotid bruits, or masses. Cardiac: Irregularly irregular, no murmurs, rubs, or gallops. No clubbing, cyanosis, edema.  Radials/DP/PT 2+ and equal bilaterally.  Respiratory:  Diminished breath sounds bilaterally with faint bibasilar crackles.  GI: Soft, nontender, nondistended, BS + x 4. MS: no deformity or atrophy. Skin: warm and dry, no rash. Neuro:  Strength and sensation are intact. Psych: AAOx3.  Normal affect.  Labs    CBC  Recent Labs  03/02/17 0508 03/04/17 0500  WBC 10.9 8.7  HGB 11.6* 11.4*  HCT 34.9* 34.3*  MCV 88.5 90.6  PLT 325 818   Basic Metabolic Panel  Recent Labs  03/02/17 0508 03/03/17 0448  NA 143 143  K 3.8 3.9  CL 110 110  CO2 26 28  GLUCOSE 104* 98  BUN 24* 25*  CREATININE 1.10* 0.98  CALCIUM 9.1 9.0   Liver Function Tests No results for input(s): AST, ALT, ALKPHOS, BILITOT, PROT, ALBUMIN in the last 72 hours. No results for input(s): LIPASE, AMYLASE in the last 72 hours. Cardiac Enzymes No results for input(s): CKTOTAL, CKMB,  CKMBINDEX, TROPONINI in the last 72 hours. BNP Invalid input(s): POCBNP D-Dimer No results for input(s): DDIMER in the last 72 hours. Hemoglobin A1C No results for input(s): HGBA1C in the last 72 hours. Fasting Lipid Panel No results for input(s): CHOL, HDL, LDLCALC, TRIG, CHOLHDL, LDLDIRECT in the last 72 hours. Thyroid Function Tests No results for input(s): TSH, T4TOTAL, T3FREE, THYROIDAB in the last 72 hours.  Invalid input(s): FREET3  Telemetry    Afib - Personally Reviewed  ECG    n/a - Personally Reviewed  Radiology    Nm Myocar Multi W/spect W/wall Motion / Ef  Result Date: 03/03/2017  High risk myocardial perfusion stress test.  There is moderate in size, moderate in severity, partially reversible defect involving the inferolateral myocardial consistent with scar and periinfarct ischemia.  The is a small in size, moderate in severity, fixed apical defect most  likely representing apical thinning and/or artifact and less likely scar.  The left ventricular ejection fraction is severely decreased (<30%).     Cardiac Studies   TTE 02/28/2017: Study Conclusions  - Left ventricle: The cavity size was normal. Wall thickness was increased in a pattern of mild LVH. Systolic function was moderately reduced. The estimated ejection fraction was in the range of 35% to 40%. Diffuse hypokinesis. The study is not technically sufficient to allow evaluation of LV diastolic function. - Aortic valve: There was moderate stenosis by velocity ratio, in setting of reduced ejection fraction/atrial fibrillation. Recommend to reassess serially. - Mitral valve: There was mild regurgitation. - Left atrium: The atrium was severely dilated. - Right atrium: The atrium was mildly dilated. - Tricuspid valve: There was moderate regurgitation. - Pulmonary arteries: Systolic pressure was moderately increased. PA peak pressure: 49 mm Hg (S).  Patient Profile     83 y.o. female with history of acute systolic CHF, breast cancer, hypothyroidism, and HLD who was noted to be in new onset Afib with RVR in the setting of CAP.   Assessment & Plan    1. New onset Afib with RVR: -Heart rate improved -Continue carvedilol to 25 mg twice a day. -If heart rate remains suboptimally controlled, would recommend initiation of digoxin. Avoid calcium channel blockers given cardiomyopathy. -Eliquis was discontinued on 5/8 and she was started on heparin gtt in prep for LHC  2. Acute systolic CHF: -Mild lower extremity edema persists. -Incomplete Is and Os  -Myocardial perfusion stress test reveals significantly reduced LV contraction with inferolateral scar and peri-infarct ischemia, consistent with a high risk study. -Carvedilol to 25 mg twice a day, as above. -Continue losartan and furosemide at current doses. -Given high risk stress test, mild troponin elevation, and  chest pain at the time of admission, there has been concern for ischemic cardiomyopathy. Dr. Saunders Revel spoke with the patient and her granddaughter regarding further workup, including medical management and coronary angiography. The patient wanted to talk with her son, who lives in [place] -She has agreed for [facility]  -For LHC on 5/10, may need to be 5/11 given last dose of Eliquis on 5/8 AM -Risks and benefits of cardiac catheterization have been discussed with the patient including risks of bleeding, bruising, infection, kidney damage, stroke, heart attack, and death. The patient understands these risks and is willing to proceed with the procedure. All questions have been answered and concerns listened to  3. Elevated troponin: -Chest tightness reported time of admission. Myocardial perfusion stress test suggestive of inferolateral infarct with peri-infarct ischemia. Severely reduced LVEF noted. -Anticipate LHC later this  week -Initiate aspirin 81 mg daily. -Heparin infusion in place of Eliquis (see details above). -continue simvastatin; LDL in 12/2016 was 73.  4. HTN: -Carvedilol and losartan, as above  5. HLD: -Statin  6. AKI: -Improving   7. Pneumonia -Continue antimicrobial therapy per internal medicine service  Signed, Christell Faith, PA-C [facility] Pager: [phone] 03/04/2017, 9:50 AM

## 2017-03-04 NOTE — Care Management (Signed)
Cardiac cath will be performed 5.10.  Has been delayed due to need to hold Eliquis.  Heart rate is under better control. Continue to anticipate discharge to skilled nursing facility.

## 2017-03-04 NOTE — Progress Notes (Signed)
ANTICOAGULATION CONSULT NOTE - Initial Consult  Pharmacy Consult for Heparin Indication: atrial fibrillation  No Known Allergies  Patient Measurements: Height: 5\' 7"  (170.2 cm) Weight: 185 lb 12.8 oz (84.3 kg) IBW/kg (Calculated) : 61.6 Heparin Dosing Weight: 79 kg  Vital Signs: Temp: 98.4 F (36.9 C) (05/08 1142) Temp Source: Oral (05/08 1142) BP: 133/102 (05/08 1142) Pulse Rate: 88 (05/08 1142)  Labs:  Recent Labs (last 2 labs)    Recent Labs  03/01/17 0547 03/02/17 0508 03/03/17 0448  HGB  --  11.6*  --   HCT  --  34.9*  --   PLT  --  325  --   CREATININE 1.34* 1.10* 0.98      Estimated Creatinine Clearance: 46 mL/min (by C-G formula based on SCr of 0.98 mg/dL).   Medical History:     Past Medical History:  Diagnosis Date  . Atrial fibrillation ([place])   . Breast cancer ([place])    Right  . Essential hypertension 01/16/2017  . Frequent headaches   . Hyperlipidemia 01/16/2017  . Hypothyroidism 01/16/2017  . Kidney stones   . Lymphedema 01/16/2017    Medications:  Infusions:  . [START ON 03/04/2017] heparin      Assessment: 88 yof cc SOB/emesis - patient is on Eliquis PTA for AF. Pharmacy consulted to bridge with heparin d/t possible cardiac cath later in the week. Last Eliquis dose documented 03/03/17 at 1106. Consult asks to start heparin drip 03/04/17 at 0600.   Goal of Therapy:  APTT 66 to 102 seconds Heparin level 0.3-0.7 units/ml Monitor platelets by anticoagulation protocol: Yes   Plan:  Start heparin infusion at 1000 units/hr Check anti-Xa level in 8 hours and daily while on heparin Continue to monitor H&H and platelets  5/9: APTT 59; HL 1.82. APTT not therapeutic at this time. Will continue to dose off of APTT. Called RN to increase heparin to 1100 units/hr. Will check another APTT level in 6 hrs as level came back late and to make sure patient is therapeutic. Will order HL in the AM.   Thank you for this consult.  Loree Fee, PharmD 03/04/2017

## 2017-03-05 ENCOUNTER — Encounter
Admission: EM | Disposition: A | Discharge status: Home / Self Care | Payer: Self-pay | Source: Home / Self Care | Attending: Internal Medicine

## 2017-03-05 ENCOUNTER — Encounter: Payer: Self-pay | Admitting: Cardiovascular Disease

## 2017-03-05 DIAGNOSIS — I2510 Atherosclerotic heart disease of native coronary artery without angina pectoris: Secondary | ICD-10-CM

## 2017-03-05 DIAGNOSIS — I200 Unstable angina: Secondary | ICD-10-CM

## 2017-03-05 HISTORY — PX: CORONARY STENT INTERVENTION: CATH118234

## 2017-03-05 HISTORY — PX: LEFT HEART CATH AND CORONARY ANGIOGRAPHY: CATH118249

## 2017-03-05 LAB — POCT ACTIVATED CLOTTING TIME: Activated Clotting Time: 373 seconds

## 2017-03-05 LAB — BASIC METABOLIC PANEL
ANION GAP: 6 (ref 5–15)
BUN: 24 mg/dL — ABNORMAL HIGH (ref 6–20)
CO2: 29 mmol/L (ref 22–32)
Calcium: 8.8 mg/dL — ABNORMAL LOW (ref 8.9–10.3)
Chloride: 107 mmol/L (ref 101–111)
Creatinine, Ser: 0.85 mg/dL (ref 0.44–1.00)
GFR calc Af Amer: >60 mL/min (ref >60)
GLUCOSE: 93 mg/dL (ref 65–99)
POTASSIUM: 3.2 mmol/L — AB (ref 3.5–5.1)
SODIUM: 142 mmol/L (ref 135–145)

## 2017-03-05 LAB — APTT
APTT: 80 s — AB (ref 24–36)
aPTT: 70 seconds — ABNORMAL HIGH (ref 24–36)

## 2017-03-05 LAB — HEPARIN LEVEL (UNFRACTIONATED): HEPARIN UNFRACTIONATED: 1.75 [IU]/mL — AB (ref 0.30–0.70)

## 2017-03-05 SURGERY — LEFT HEART CATH AND CORONARY ANGIOGRAPHY
Anesthesia: Moderate Sedation

## 2017-03-05 MED ORDER — LABETALOL HCL 5 MG/ML IV SOLN: 10.0000 mg | INTRAVENOUS | Status: AC | PRN

## 2017-03-05 MED ORDER — NITROGLYCERIN 1 MG/10 ML FOR IR/CATH LAB
INTRA_ARTERIAL | Status: DC | PRN
  Administered 2017-03-05: 200 ug via INTRACORONARY

## 2017-03-05 MED ORDER — NITROGLYCERIN 5 MG/ML IV SOLN
INTRAVENOUS | Status: AC
  Filled 2017-03-05: qty 10

## 2017-03-05 MED ORDER — BIVALIRUDIN TRIFLUOROACETATE 250 MG IV SOLR
INTRAVENOUS | Status: AC
  Filled 2017-03-05: qty 250

## 2017-03-05 MED ORDER — ASPIRIN 81 MG PO CHEW: CHEWABLE_TABLET | ORAL | Status: DC | PRN

## 2017-03-05 MED ORDER — SODIUM CHLORIDE 0.9 % IV SOLN
INTRAVENOUS | Status: AC
  Administered 2017-03-05: 12:00:00 via INTRAVENOUS

## 2017-03-05 MED ORDER — FENTANYL CITRATE (PF) 100 MCG/2ML IJ SOLN
INTRAMUSCULAR | Status: AC
  Filled 2017-03-05: qty 2

## 2017-03-05 MED ORDER — ASPIRIN 81 MG PO CHEW
CHEWABLE_TABLET | ORAL | Status: AC
  Filled 2017-03-05: qty 3

## 2017-03-05 MED ORDER — IOPAMIDOL (ISOVUE-300) INJECTION 61%
INTRAVENOUS | Status: DC | PRN
Start: 2017-03-05 — End: 2017-03-05
  Administered 2017-03-05: 125 mL via INTRA_ARTERIAL

## 2017-03-05 MED ORDER — FENTANYL CITRATE (PF) 100 MCG/2ML IJ SOLN
INTRAMUSCULAR | Status: DC | PRN
  Administered 2017-03-05: 25 ug via INTRAVENOUS

## 2017-03-05 MED ORDER — SODIUM CHLORIDE 0.9 % IV SOLN: INTRAVENOUS | Status: DC

## 2017-03-05 MED ORDER — ASPIRIN 81 MG PO CHEW
CHEWABLE_TABLET | ORAL | Status: DC | PRN
Start: 2017-03-05 — End: 2017-03-05
  Administered 2017-03-05: 243 mg via ORAL

## 2017-03-05 MED ORDER — CLOPIDOGREL BISULFATE 75 MG PO TABS
75.0000 mg | ORAL_TABLET | Freq: Every day | ORAL | Status: DC
  Administered 2017-03-06: 75 mg via ORAL
  Filled 2017-03-05: qty 1

## 2017-03-05 MED ORDER — SODIUM CHLORIDE 0.9 % IV SOLN: 250.0000 mL | INTRAVENOUS | Status: DC | PRN

## 2017-03-05 MED ORDER — BIVALIRUDIN BOLUS VIA INFUSION - CUPID
INTRAVENOUS | Status: DC | PRN
  Administered 2017-03-05: 63.3 mg via INTRAVENOUS

## 2017-03-05 MED ORDER — CLOPIDOGREL BISULFATE 75 MG PO TABS
ORAL_TABLET | ORAL | Status: DC | PRN
  Administered 2017-03-05: 600 mg via ORAL

## 2017-03-05 MED ORDER — APIXABAN 5 MG PO TABS
5.0000 mg | ORAL_TABLET | Freq: Two times a day (BID) | ORAL | Status: DC
  Administered 2017-03-06: 5 mg via ORAL
  Filled 2017-03-05: qty 1

## 2017-03-05 MED ORDER — MIDAZOLAM HCL 2 MG/2ML IJ SOLN
INTRAMUSCULAR | Status: AC
  Filled 2017-03-05: qty 2

## 2017-03-05 MED ORDER — CLOPIDOGREL BISULFATE 75 MG PO TABS
ORAL_TABLET | ORAL | Status: AC
  Filled 2017-03-05: qty 8

## 2017-03-05 MED ORDER — MIDAZOLAM HCL 2 MG/2ML IJ SOLN
INTRAMUSCULAR | Status: DC | PRN
  Administered 2017-03-05: 1 mg via INTRAVENOUS

## 2017-03-05 MED ORDER — HEPARIN (PORCINE) IN NACL 2-0.9 UNIT/ML-% IJ SOLN
INTRAMUSCULAR | Status: AC
  Filled 2017-03-05: qty 500

## 2017-03-05 MED ORDER — SODIUM CHLORIDE 0.9% FLUSH
3.0000 mL | Freq: Two times a day (BID) | INTRAVENOUS | Status: DC
  Administered 2017-03-05: 3 mL via INTRAVENOUS

## 2017-03-05 MED ORDER — SODIUM CHLORIDE 0.9 % IV SOLN
INTRAVENOUS | Status: DC | PRN
  Administered 2017-03-05: 1.75 mg/kg/h via INTRAVENOUS

## 2017-03-05 MED ORDER — SODIUM CHLORIDE 0.9% FLUSH
3.0000 mL | INTRAVENOUS | Status: DC | PRN
  Administered 2017-03-06: 3 mL via INTRAVENOUS
  Filled 2017-03-05: qty 3

## 2017-03-05 SURGICAL SUPPLY — 21 items
BALLN TREK RX 2.5X12 (BALLOONS) ×2
BALLN ~~LOC~~ TREK RX 4.0X15 (BALLOONS) ×2
BALLOON TREK RX 2.5X12 (BALLOONS) ×1 IMPLANT
BALLOON ~~LOC~~ TREK RX 4.0X15 (BALLOONS) ×1 IMPLANT
CATH 5FR JL4 DIAGNOSTIC (CATHETERS) ×2 IMPLANT
CATH INFINITI 5 FR 3DRC (CATHETERS) ×2 IMPLANT
CATH INFINITI 5FR ANG PIGTAIL (CATHETERS) ×2 IMPLANT
CATH INFINITI JR4 5F (CATHETERS) ×2 IMPLANT
CATH VISTA GUIDE 6FR JR4 (CATHETERS) ×2 IMPLANT
DEVICE CLOSURE MYNXGRIP 6/7F (Vascular Products) ×2 IMPLANT
DEVICE INFLAT 30 PLUS (MISCELLANEOUS) ×2 IMPLANT
GUIDEWIRE 3MM J TIP .035 145 (WIRE) ×4 IMPLANT
KIT MANI 3VAL PERCEP (MISCELLANEOUS) ×2 IMPLANT
NEEDLE PERC 18GX7CM (NEEDLE) ×2 IMPLANT
PACK CARDIAC CATH (CUSTOM PROCEDURE TRAY) ×2 IMPLANT
SHEATH AVANTI 5FR X 11CM (SHEATH) ×2 IMPLANT
SHEATH AVANTI 6FR X 11CM (SHEATH) ×2 IMPLANT
STENT RESOLUTE ONYX 4.0X22 (Permanent Stent) ×2 IMPLANT
WIRE HITORQ VERSACORE ST 145CM (WIRE) ×2 IMPLANT
WIRE INTUITION PROPEL ST 180CM (WIRE) ×2 IMPLANT
WIRE RUNTHROUGH .014X180CM (WIRE) ×2 IMPLANT

## 2017-03-05 NOTE — Plan of Care (Signed)
Problem: Safety: Goal: Ability to remain free from injury will improve Outcome: Progressing No falls this admission, fall precautions inplace, non skid socks  Problem: Activity: Goal: Ability to tolerate increased activity will improve Outcome: Progressing Working with PT  Problem: Respiratory: Goal: Ability to maintain a clear airway will improve Outcome: Progressing Remains on room air

## 2017-03-05 NOTE — Progress Notes (Signed)
Patient remains stable post heart cath with stent placement. Family at bedside. afib per monitor.denies complaints, placed earlier on bedpan,unable to void,called Dr Rockey Situ with orders to in/out cath. With ease for 500 ml yellow urine.  Report called to Particia Nearing RN on telemetry with questions answered.

## 2017-03-05 NOTE — Progress Notes (Signed)
Pt returned from cath lab via bed.  Rt groin site without bleeding, small palpable hematoma, will monitor, pulses equal bil.  IVF infusing well, dneies need at this time.  Family at bedside. CB in reach, SR up x 2.

## 2017-03-05 NOTE — Progress Notes (Addendum)
[facility] [place] NAME: [place] Barkan    MR#:  [id number]  DATE OF BIRTH:  [birth year]  SUBJECTIVE:   Shortness of breath is improving. Occasional chest pressure. Atrial fibrillation is better controlled. Eliquis on hold. On heparin drip. s/p Cardiac catheterization   REVIEW OF SYSTEMS:   Review of Systems  Constitutional: Negative for chills, fever and weight loss.  HENT: Negative for ear discharge, ear pain and nosebleeds.   Eyes: Negative for blurred vision, pain and discharge.  Respiratory: Negative for sputum production, shortness of breath, wheezing and stridor.   Cardiovascular: Negative for chest pain, palpitations, orthopnea and PND.  Gastrointestinal: Negative for abdominal pain, diarrhea, nausea and vomiting.  Genitourinary: Negative for frequency and urgency.  Musculoskeletal: Negative for back pain and joint pain.  Neurological: Negative for sensory change, speech change, focal weakness and weakness.  Psychiatric/Behavioral: Negative for depression and hallucinations. The patient is not nervous/anxious.     DRUG ALLERGIES:  No Known Allergies  VITALS:  Blood pressure (!) 146/63, pulse 88, temperature 97.6 F (36.4 C), temperature source Oral, resp. rate 16, height 5\' 7"  (1.702 m), weight 84.4 kg (186 lb), SpO2 (!) 86 %.  PHYSICAL EXAMINATION:  GENERAL:  83 y.o.-year-old patient lying in the bed with no acute distress.  EYES: Pupils equal, round, reactive to light and accommodation. No scleral icterus. Extraocular muscles intact.  HEENT: Head atraumatic, normocephalic. Oropharynx and nasopharynx clear.  NECK:  Supple, no jugular venous distention. No thyroid enlargement, no tenderness.  LUNGS: Bilateral crackles. No use of accessory muscles of respiration.  CARDIOVASCULAR: S1, S2 normal. No murmurs, rubs, or gallops.  ABDOMEN: Soft, nontender, nondistended. Bowel sounds present. No organomegaly or mass.  EXTREMITIES: Some  pedal edema, no cyanosis, or clubbing.  NEUROLOGIC: Cranial nerves II through XII are intact. Muscle strength 4/5 in all extremities. Sensation intact. Gait not checked.  PSYCHIATRIC: The patient is alert and oriented x 3 SKIN: No obvious rash, lesion, or ulcer.   Physical Exam LABORATORY PANEL:   CBC  Recent Labs Lab 03/04/17 0500  WBC 8.7  HGB 11.4*  HCT 34.3*  PLT 300   ------------------------------------------------------------------------------------------------------------------  Chemistries   Recent Labs Lab 03/05/17 0735  NA 142  K 3.2*  CL 107  CO2 29  GLUCOSE 93  BUN 24*  CREATININE 0.85  CALCIUM 8.8*   ------------------------------------------------------------------------------------------------------------------  Cardiac Enzymes  Recent Labs Lab 02/28/17 0636 02/28/17 1311  TROPONINI 0.03* 0.06*   ------------------------------------------------------------------------------------------------------------------  RADIOLOGY:  No results found.  ASSESSMENT AND PLAN:   [place] Ector  is a 83 y.o. female who presents with nausea, vomiting, shortness of breath.  Patient has had lower extremity edema for some time, has had some progressive orthopnea.  *  Acute on chronic systolic CHF (congestive heart failure) EF 35-40%  continue lasix at 40mg  BID Stress test with high risk study and reversible ischemia. Eliquis held.  Was On Heparin drip.  s/p Cardiac Cath  DES x1 to proximal RCA  * CAP (community acquired pneumonia) - IV antibiotics, when necessary supportive medications.   Had severe nausea with Levaquin orally as outpatient.   Switched to rocephin + azithromycin. Stopped 03/03/2017. Finished 5 days total.  *  Essential hypertension - elevated, continue home meds, use additional when necessary antihypertensives  *  Atrial fibrillation (HCC) - continue home rate controlling medications and anticoagulation  *  Hyperlipidemia - continue home  meds  *  Hypothyroidism - home dose thyroid replacement  To  SNF tomorrow if remains stable  All the records are reviewed and case discussed with Care Management/Social Worker Management plans discussed with the patient, family and they are in agreement.  CODE STATUS: Full code  TOTAL TIME TAKING CARE OF THIS PATIENT: 25 minutes  POSSIBLE D/C IN 1-2 DAYS, DEPENDING ON CLINICAL CONDITION.  Elleanna Melling M.D on 03/05/2017   Between 7am to 6pm - Pager - [phone]  After 6pm go to [email or web address] - password [email or web address] [place] [facility] [facility]  Office  [phone]  CC: Primary care physician; Coral Spikes, DO  Note: This dictation was prepared with Dragon dictation along with smaller phrase technology. Any transcriptional errors that result from this process are unintentional.

## 2017-03-05 NOTE — Clinical Social Work Note (Signed)
CSW presented bed offers and patient's family would like [facility] as the [facility] as a second choice.  CSW left message with [facility] about patient referral, awaiting for call back.  Jones Broom. [place], MSW, [place]  03/05/2017 4:30 PM

## 2017-03-05 NOTE — Progress Notes (Signed)
Patient clinically stable post heart cath with stent placement to RCA per Dr Fletcher Anon. Out to speak with patient;s family with questions answered. afib per monitor, denies complaints of chest pain, vitals stable no bleeding at right groin site.

## 2017-03-05 NOTE — Progress Notes (Signed)
Cardiac catheterization Severe proximal RCA disease Stent placed by Dr. Fletcher Anon, Started on low-dose aspirin and Plavix, Will resume eliquis tomorrow Continue beta blocker, simvastatin  Medical management of Residual moderate distal RCA disease and proximal diagonal #2 disease  Signed, Esmond Plants, MD, Ph.D [facility]

## 2017-03-05 NOTE — Progress Notes (Signed)
To specials via bed 

## 2017-03-05 NOTE — Progress Notes (Signed)
Pt complaining of being SOB, oxygen level on ra is 98%. 2LO2 per [place] applied for comfort.

## 2017-03-05 NOTE — Care Management Important Message (Signed)
Important Message  Patient Details  Name: Stephanie Contreras MRN: [id number] Date of Birth: [birth year]   Medicare Important Message Given:  Yes    Katrina Stack, RN 03/05/2017, 2:17 PM

## 2017-03-05 NOTE — Plan of Care (Signed)
Problem: Tissue Perfusion: Goal: Risk factors for ineffective tissue perfusion will decrease Outcome: Completed/Met Date Met: 03/05/17 Pt on heparin gtt, pt scheduled for cardiac cath today  Problem: Activity: Goal: Risk for activity intolerance will decrease Outcome: Progressing Up to bathroom with walker and a standby assist, tolerating very well.

## 2017-03-05 NOTE — Progress Notes (Signed)
ANTICOAGULATION CONSULT NOTE - Initial Consult  Pharmacy Consult for Heparin Indication: atrial fibrillation  No Known Allergies  Patient Measurements: Height: 5\' 7"  (170.2 cm) Weight: 185 lb 12.8 oz (84.3 kg) IBW/kg (Calculated) : 61.6 Heparin Dosing Weight: 79 kg  Vital Signs: Temp: 98.4 F (36.9 C) (05/08 1142) Temp Source: Oral (05/08 1142) BP: 133/102 (05/08 1142) Pulse Rate: 88 (05/08 1142)  Labs:  Recent Labs (last 2 labs)    Recent Labs  03/01/17 0547 03/02/17 0508 03/03/17 0448  HGB --  11.6* --   HCT --  34.9* --   PLT --  325 --   CREATININE 1.34* 1.10* 0.98      Estimated Creatinine Clearance: 46 mL/min (by C-G formula based on SCr of 0.98 mg/dL).   Medical History:     Past Medical History:  Diagnosis Date  . Atrial fibrillation ([place])   . Breast cancer ([place])    Right  . Essential hypertension 01/16/2017  . Frequent headaches   . Hyperlipidemia 01/16/2017  . Hypothyroidism 01/16/2017  . Kidney stones   . Lymphedema 01/16/2017    Medications:  Infusions:  . [START ON 03/04/2017] heparin     Assessment: 88 yof cc SOB/emesis - patient is on Eliquis PTA for AF. Pharmacy consulted to bridge with heparin d/t possible cardiac cath later in the week. Last Eliquis dose documented 03/03/17 at 1106. Consult asks to start heparin drip 03/04/17 at 0600.   Goal of Therapy: APTT 66 to 102 seconds Heparin level 0.3-0.7 units/ml Monitor platelets by anticoagulation protocol: Yes  Plan: Start heparin infusion at 1000units/hr Check anti-Xa level in 8hours and daily while on heparin Continue to monitor H&H and platelets  5/9: APTT 59; HL 1.82. APTT not therapeutic at this time. Will continue to dose off of APTT. Called RN to increase heparin to 1100 units/hr. Will check another APTT level in 6 hrs as level came back late and to make sure patient is therapeutic. Will order HL in the AM.   5/9 @ 2333 aPTT 70 sec, therapeutic.  Will continue current rate and will recheck aPTT/HL @ 0730.  Thank you for this consult.  Tobie Lords, PharmD, BCPS Clinical Pharmacist 03/05/2017

## 2017-03-06 ENCOUNTER — Telehealth: Payer: Self-pay

## 2017-03-06 ENCOUNTER — Telehealth: Payer: Self-pay | Admitting: Family Medicine

## 2017-03-06 DIAGNOSIS — E785 Hyperlipidemia, unspecified: Secondary | ICD-10-CM | POA: Diagnosis not present

## 2017-03-06 DIAGNOSIS — I2510 Atherosclerotic heart disease of native coronary artery without angina pectoris: Secondary | ICD-10-CM | POA: Diagnosis not present

## 2017-03-06 DIAGNOSIS — R54 Age-related physical debility: Secondary | ICD-10-CM | POA: Diagnosis not present

## 2017-03-06 DIAGNOSIS — I5021 Acute systolic (congestive) heart failure: Secondary | ICD-10-CM | POA: Diagnosis not present

## 2017-03-06 DIAGNOSIS — I509 Heart failure, unspecified: Secondary | ICD-10-CM | POA: Diagnosis not present

## 2017-03-06 DIAGNOSIS — K5900 Constipation, unspecified: Secondary | ICD-10-CM | POA: Diagnosis not present

## 2017-03-06 DIAGNOSIS — I110 Hypertensive heart disease with heart failure: Secondary | ICD-10-CM | POA: Diagnosis not present

## 2017-03-06 DIAGNOSIS — R195 Other fecal abnormalities: Secondary | ICD-10-CM | POA: Diagnosis not present

## 2017-03-06 DIAGNOSIS — I972 Postmastectomy lymphedema syndrome: Secondary | ICD-10-CM | POA: Diagnosis not present

## 2017-03-06 DIAGNOSIS — I255 Ischemic cardiomyopathy: Secondary | ICD-10-CM | POA: Diagnosis not present

## 2017-03-06 DIAGNOSIS — Z87442 Personal history of urinary calculi: Secondary | ICD-10-CM | POA: Diagnosis not present

## 2017-03-06 DIAGNOSIS — R0601 Orthopnea: Secondary | ICD-10-CM | POA: Diagnosis not present

## 2017-03-06 DIAGNOSIS — J189 Pneumonia, unspecified organism: Secondary | ICD-10-CM | POA: Diagnosis not present

## 2017-03-06 DIAGNOSIS — R262 Difficulty in walking, not elsewhere classified: Secondary | ICD-10-CM | POA: Diagnosis not present

## 2017-03-06 DIAGNOSIS — E039 Hypothyroidism, unspecified: Secondary | ICD-10-CM | POA: Diagnosis not present

## 2017-03-06 DIAGNOSIS — R279 Unspecified lack of coordination: Secondary | ICD-10-CM | POA: Diagnosis not present

## 2017-03-06 DIAGNOSIS — I5023 Acute on chronic systolic (congestive) heart failure: Secondary | ICD-10-CM | POA: Diagnosis not present

## 2017-03-06 DIAGNOSIS — I890 Lymphedema, not elsewhere classified: Secondary | ICD-10-CM | POA: Diagnosis not present

## 2017-03-06 DIAGNOSIS — Z23 Encounter for immunization: Secondary | ICD-10-CM | POA: Diagnosis not present

## 2017-03-06 DIAGNOSIS — I10 Essential (primary) hypertension: Secondary | ICD-10-CM | POA: Diagnosis not present

## 2017-03-06 DIAGNOSIS — I25110 Atherosclerotic heart disease of native coronary artery with unstable angina pectoris: Secondary | ICD-10-CM | POA: Diagnosis not present

## 2017-03-06 DIAGNOSIS — I481 Persistent atrial fibrillation: Secondary | ICD-10-CM | POA: Diagnosis not present

## 2017-03-06 DIAGNOSIS — Z5189 Encounter for other specified aftercare: Secondary | ICD-10-CM | POA: Diagnosis not present

## 2017-03-06 DIAGNOSIS — I5042 Chronic combined systolic (congestive) and diastolic (congestive) heart failure: Secondary | ICD-10-CM | POA: Diagnosis not present

## 2017-03-06 DIAGNOSIS — M6281 Muscle weakness (generalized): Secondary | ICD-10-CM | POA: Diagnosis not present

## 2017-03-06 DIAGNOSIS — Z853 Personal history of malignant neoplasm of breast: Secondary | ICD-10-CM | POA: Diagnosis not present

## 2017-03-06 DIAGNOSIS — R51 Headache: Secondary | ICD-10-CM | POA: Diagnosis not present

## 2017-03-06 DIAGNOSIS — R600 Localized edema: Secondary | ICD-10-CM | POA: Diagnosis not present

## 2017-03-06 DIAGNOSIS — G4733 Obstructive sleep apnea (adult) (pediatric): Secondary | ICD-10-CM | POA: Diagnosis not present

## 2017-03-06 DIAGNOSIS — R748 Abnormal levels of other serum enzymes: Secondary | ICD-10-CM | POA: Diagnosis not present

## 2017-03-06 DIAGNOSIS — Z7902 Long term (current) use of antithrombotics/antiplatelets: Secondary | ICD-10-CM | POA: Diagnosis not present

## 2017-03-06 DIAGNOSIS — I4891 Unspecified atrial fibrillation: Secondary | ICD-10-CM | POA: Diagnosis not present

## 2017-03-06 DIAGNOSIS — E876 Hypokalemia: Secondary | ICD-10-CM | POA: Diagnosis not present

## 2017-03-06 LAB — CBC
HCT: 33.3 % — ABNORMAL LOW (ref 35.0–47.0)
Hemoglobin: 11.3 g/dL — ABNORMAL LOW (ref 12.0–16.0)
MCH: 30.6 pg (ref 26.0–34.0)
MCHC: 34 g/dL (ref 32.0–36.0)
MCV: 89.8 fL (ref 80.0–100.0)
PLATELETS: 285 10*3/uL (ref 150–440)
RBC: 3.71 MIL/uL — AB (ref 3.80–5.20)
RDW: 15.5 % — AB (ref 11.5–14.5)
WBC: 9.4 10*3/uL (ref 3.6–11.0)

## 2017-03-06 LAB — BASIC METABOLIC PANEL
Anion gap: 6 (ref 5–15)
BUN: 24 mg/dL — AB (ref 6–20)
CALCIUM: 9.3 mg/dL (ref 8.9–10.3)
CO2: 32 mmol/L (ref 22–32)
CREATININE: 0.84 mg/dL (ref 0.44–1.00)
Chloride: 106 mmol/L (ref 101–111)
GFR calc non Af Amer: >60 mL/min (ref >60)
Glucose, Bld: 89 mg/dL (ref 65–99)
Potassium: 3.1 mmol/L — ABNORMAL LOW (ref 3.5–5.1)
Sodium: 144 mmol/L (ref 135–145)

## 2017-03-06 MED ORDER — POLYETHYLENE GLYCOL 3350 17 G PO PACK: 17.0000 g | PACK | Freq: Every day | ORAL | 0 refills | Status: DC

## 2017-03-06 MED ORDER — LOSARTAN POTASSIUM 25 MG PO TABS: 25.0000 mg | ORAL_TABLET | Freq: Every day | ORAL | 1 refills | Status: DC

## 2017-03-06 MED ORDER — ENSURE ENLIVE PO LIQD: 237.0000 mL | Freq: Two times a day (BID) | ORAL | 12 refills | Status: DC

## 2017-03-06 MED ORDER — CLOPIDOGREL BISULFATE 75 MG PO TABS: 75.0000 mg | ORAL_TABLET | Freq: Every day | ORAL | 1 refills | Status: DC

## 2017-03-06 MED ORDER — ASPIRIN 81 MG PO TBEC: 81.0000 mg | DELAYED_RELEASE_TABLET | Freq: Every day | ORAL | 1 refills | Status: DC

## 2017-03-06 MED ORDER — CARVEDILOL 25 MG PO TABS: 25.0000 mg | ORAL_TABLET | Freq: Two times a day (BID) | ORAL | 1 refills | Status: DC

## 2017-03-06 MED ORDER — GUAIFENESIN-DM 100-10 MG/5ML PO SYRP: 5.0000 mL | ORAL_SOLUTION | ORAL | 0 refills | Status: DC | PRN

## 2017-03-06 NOTE — Progress Notes (Signed)
Pt alert and oriented x4, no complaints of pain or discomfort.  Bed in low position, call bell within reach.  Bed alarms on and functioning.  Assessment done and charted.  Will continue to monitor and do hourly rounding throughout the shift 

## 2017-03-06 NOTE — Discharge Summary (Signed)
[name] [place] NAME: Stephanie Contreras    MR#:  [id number]  DATE OF BIRTH:  1928-11-01  DATE OF ADMISSION:  02/27/2017 ADMITTING PHYSICIAN: Lance Coon, MD  DATE OF DISCHARGE: 03/06/2017  PRIMARY CARE PHYSICIAN: Coral Spikes, DO    ADMISSION DIAGNOSIS:  HCAP (healthcare-associated pneumonia) [J18.9] Multifocal pneumonia [J18.9] Acute congestive heart failure, unspecified heart failure type ([place]) [I50.9]  DISCHARGE DIAGNOSIS:    SECONDARY DIAGNOSIS:   Past Medical History:  Diagnosis Date  . Atrial fibrillation ([place])   . Breast cancer ([place])    Right  . Essential hypertension 01/16/2017  . Frequent headaches   . Hyperlipidemia 01/16/2017  . Hypothyroidism 01/16/2017  . Kidney stones   . Lymphedema 01/16/2017    HOSPITAL COURSE:   [name] a 83 y.o.femalewho presents with nausea, vomiting, shortness of breath. Patient has had lower extremity edema for some time, has had some progressive orthopnea.  *Acute on chronic systolic CHF (congestive heart failure) -EF 35-40% - continue lasix at 40mg  po daily. sats stable at room air -Stress test with high risk study and reversible ischemia. -Eliquis was held for cath now resumed. -pt  Was On Heparin drip.  -s/p Cardiac Cath  DES x1 to proximal RCA---on plavix  * CAP (community acquired pneumonia)   Had severe nausea with Levaquin orally as outpatient. recieved rocephin + azithromycin. Stopped 03/03/2017. Finished 5 days total.  *Essential hypertension - elevated, continue home meds, use additional when necessary antihypertensives  *Atrial fibrillation (HCC) - continue home rate controlling medications and anticoagulation  *Hyperlipidemia - continue home meds  *Hypothyroidism - home dose thyroid replacement  To SNF today Spoke with grand dter in the room CONSULTS OBTAINED:  Treatment Team:  Wende Bushy, MD  DRUG ALLERGIES:  No Known  Allergies  DISCHARGE MEDICATIONS:   Current Discharge Medication List    START taking these medications   Details  aspirin EC 81 MG EC tablet Take 1 tablet (81 mg total) by mouth daily. Qty: 30 tablet, Refills: 1    carvedilol (COREG) 25 MG tablet Take 1 tablet (25 mg total) by mouth 2 (two) times daily with a meal. Qty: 60 tablet, Refills: 1    clopidogrel (PLAVIX) 75 MG tablet Take 1 tablet (75 mg total) by mouth daily with breakfast. Qty: 30 tablet, Refills: 1    feeding supplement, ENSURE ENLIVE, (ENSURE ENLIVE) LIQD Take 237 mLs by mouth 2 (two) times daily between meals. Qty: 237 mL, Refills: 12    guaiFENesin-dextromethorphan (ROBITUSSIN DM) 100-10 MG/5ML syrup Take 5 mLs by mouth every 4 (four) hours as needed for cough. Qty: 118 mL, Refills: 0    losartan (COZAAR) 25 MG tablet Take 1 tablet (25 mg total) by mouth daily. Qty: 30 tablet, Refills: 1    polyethylene glycol (MIRALAX / GLYCOLAX) packet Take 17 g by mouth daily. Qty: 14 each, Refills: 0      CONTINUE these medications which have NOT CHANGED   Details  apixaban (ELIQUIS) 5 MG TABS tablet Take 1 tablet (5 mg total) by mouth 2 (two) times daily. Qty: 60 tablet, Refills: 6    brimonidine-timolol (COMBIGAN) 0.2-0.5 % ophthalmic solution Place 1 drop into both eyes every 12 (twelve) hours.    Calcium Carb-Cholecalciferol (CALTRATE 600+D3 SOFT PO) Take by mouth 3 (three) times daily before meals.    furosemide (LASIX) 40 MG tablet Take 40 mg by mouth daily.    levothyroxine (SYNTHROID, LEVOTHROID) 125 MCG tablet Take 125  mcg by mouth daily.    Multiple Vitamins-Minerals (EYE VITAMINS PO) Take by mouth 2 (two) times daily.    Multiple Vitamins-Minerals (MULTIVITAMIN ADULT PO) Take by mouth. Centrum silver    potassium chloride SA (K-DUR,KLOR-CON) 20 MEQ tablet Take 2 tablets (40 mEq total) by mouth daily. Qty: 90 tablet, Refills: 0    simvastatin (ZOCOR) 40 MG tablet Take 40 mg by mouth daily.      STOP  taking these medications     levofloxacin (LEVAQUIN) 750 MG tablet         If you experience worsening of your admission symptoms, develop shortness of breath, life threatening emergency, suicidal or homicidal thoughts you must seek medical attention immediately by calling 911 or calling your MD immediately  if symptoms less severe.  You Must read complete instructions/literature along with all the possible adverse reactions/side effects for all the Medicines you take and that have been prescribed to you. Take any new Medicines after you have completely understood and accept all the possible adverse reactions/side effects.   Please note  You were cared for by a hospitalist during your hospital stay. If you have any questions about your discharge medications or the care you received while you were in the hospital after you are discharged, you can call the unit and asked to speak with the hospitalist on call if the hospitalist that took care of you is not available. Once you are discharged, your primary care physician will handle any further medical issues. Please note that NO REFILLS for any discharge medications will be authorized once you are discharged, as it is imperative that you return to your primary care physician (or establish a relationship with a primary care physician if you do not have one) for your aftercare needs so that they can reassess your need for medications and monitor your lab values. Today   SUBJECTIVE   Doing well  VITAL SIGNS:  Blood pressure 140/88, pulse 80, temperature 98.2 F (36.8 C), temperature source Oral, resp. rate 18, height 5\' 7"  (1.702 m), weight 83.5 kg (184 lb 1.6 oz), SpO2 93 %.  I/O:   Intake/Output Summary (Last 24 hours) at 03/06/17 0936 Last data filed at 03/06/17 0714  Gross per 24 hour  Intake           853.75 ml  Output             1650 ml  Net          -796.25 ml    PHYSICAL EXAMINATION:  GENERAL:  83 y.o.-year-old patient lying in  the bed with no acute distress.  EYES: Pupils equal, round, reactive to light and accommodation. No scleral icterus. Extraocular muscles intact.  HEENT: Head atraumatic, normocephalic. Oropharynx and nasopharynx clear.  NECK:  Supple, no jugular venous distention. No thyroid enlargement, no tenderness.  LUNGS: Normal breath sounds bilaterally, no wheezing, rales,rhonchi or crepitation. No use of accessory muscles of respiration.  CARDIOVASCULAR: S1, S2 normal. No murmurs, rubs, or gallops.  ABDOMEN: Soft, non-tender, non-distended. Bowel sounds present. No organomegaly or mass.  EXTREMITIES: No pedal edema, cyanosis, or clubbing.  NEUROLOGIC: Cranial nerves II through XII are intact. Muscle strength 5/5 in all extremities. Sensation intact. Gait not checked.  PSYCHIATRIC: The patient is alert and oriented x 3.  SKIN: No obvious rash, lesion, or ulcer.   DATA REVIEW:   CBC   Recent Labs Lab 03/06/17 0527  WBC 9.4  HGB 11.3*  HCT 33.3*  PLT 285  Chemistries   Recent Labs Lab 03/06/17 0527  NA 144  K 3.1*  CL 106  CO2 32  GLUCOSE 89  BUN 24*  CREATININE 0.84  CALCIUM 9.3    Microbiology Results   Recent Results (from the past 240 hour(s))  Blood culture (routine x 2)     Status: None   Collection Time: 02/27/17  9:22 PM  Result Value Ref Range Status   Specimen Description BLOOD LEFT HAND  Final   Special Requests   Final    BOTTLES DRAWN AEROBIC AND ANAEROBIC Blood Culture adequate volume   Culture NO GROWTH 5 DAYS  Final   Report Status 03/04/2017 FINAL  Final  Blood culture (routine x 2)     Status: None   Collection Time: 02/27/17 10:10 PM  Result Value Ref Range Status   Specimen Description BLOOD LEFT WRIST  Final   Special Requests   Final    BOTTLES DRAWN AEROBIC AND ANAEROBIC Blood Culture adequate volume   Culture NO GROWTH 5 DAYS  Final   Report Status 03/04/2017 FINAL  Final  Urine culture     Status: None   Collection Time: 02/28/17 12:03 AM   Result Value Ref Range Status   Specimen Description URINE, RANDOM  Final   Special Requests NONE  Final   Culture   Final    NO GROWTH Performed at [facility], [place] [address], [place], [place] [address]    Report Status 03/01/2017 FINAL  Final    RADIOLOGY:  No results found.   Management plans discussed with the patient, family and they are in agreement.  CODE STATUS:     Code Status Orders        Start     Ordered   02/28/17 0226  Full code  Continuous     02/28/17 0225    Code Status History    Date Active Date Inactive Code Status Order ID Comments User Context   This patient has a current code status but no historical code status.    Advance Directive Documentation     Most Recent Value  Type of Advance Directive  Healthcare Power of Attorney, Living will  Pre-existing out of facility DNR order (yellow form or pink MOST form)  -  "MOST" Form in Place?  -      TOTAL TIME TAKING CARE OF THIS PATIENT: *40* minutes.    Annais Crafts M.D on 03/06/2017 at 9:36 AM  Between 7am to 6pm - Pager - [phone] After 6pm go to [email or web address] - password [email or web address] [facility]  Office  [phone]  CC: Primary care physician; Coral Spikes, DO

## 2017-03-06 NOTE — Clinical Social Work Note (Signed)
Patient to be d/c'ed today to [facility].    Patient and family agreeable to plans will transport via grandaughter's car RN to call report to [phone].  Evette Cristal, MSW, [place]

## 2017-03-06 NOTE — Telephone Encounter (Signed)
Left message for pt to call back  °

## 2017-03-06 NOTE — Telephone Encounter (Signed)
HFU, Pt is being discharged from [facility] today. Dx was Pneumonia. Pt is scheduled to come in on 03/20/17 @ 11:30 am. Thank you!

## 2017-03-06 NOTE — Progress Notes (Signed)
Patient Name: Stephanie Contreras Date of Encounter: 03/06/2017  Primary Cardiologist: [facility] Problem List     Principal Problem:   CAP (community acquired pneumonia) Active Problems:   Essential hypertension   Hyperlipidemia   Hypothyroidism   Acute systolic CHF (congestive heart failure) (HCC)   Atrial fibrillation ([place])   Unstable angina ([place])     Subjective   Denies any chest pain, feels better, family reports that breathing seems to be better Patient in good spirits, eating food, no complaints Telemetry reviewed showing atrial fibrillation  Inpatient Medications    Scheduled Meds: . apixaban  5 mg Oral BID  . aspirin EC  81 mg Oral Daily  . brimonidine  1 drop Both Eyes BID  . carvedilol  25 mg Oral BID WC  . clopidogrel  75 mg Oral Q breakfast  . feeding supplement (ENSURE ENLIVE)  237 mL Oral BID BM  . furosemide  60 mg Oral BID  . levothyroxine  125 mcg Oral QAC breakfast  . losartan  25 mg Oral Daily  . polyethylene glycol  17 g Oral Daily  . potassium chloride  20 mEq Oral Daily  . simvastatin  40 mg Oral Daily  . sodium chloride flush  3 mL Intravenous Q12H   Continuous Infusions: . sodium chloride     PRN Meds: sodium chloride, acetaminophen **OR** acetaminophen, benzonatate, bisacodyl, guaiFENesin-dextromethorphan, ipratropium-albuterol, labetalol, ondansetron **OR** ondansetron (ZOFRAN) IV, senna, sodium chloride flush   Vital Signs    Vitals:   03/05/17 1320 03/05/17 1933 03/06/17 0417 03/06/17 0500  BP: (!) 146/63 (!) 120/47 140/88   Pulse: 88 74 80   Resp: 16 16 18    Temp: 97.6 F (36.4 C) 98.3 F (36.8 C) 98.2 F (36.8 C)   TempSrc: Oral Oral Oral   SpO2: (!) 86% 96% 93%   Weight:    184 lb 1.6 oz (83.5 kg)  Height:        Intake/Output Summary (Last 24 hours) at 03/06/17 1237 Last data filed at 03/06/17 0958  Gross per 24 hour  Intake           853.75 ml  Output             1350 ml  Net          -496.25 ml   Filed  Weights   03/05/17 0631 03/05/17 0804 03/06/17 0500  Weight: 186 lb 15.9 oz (84.8 kg) 186 lb (84.4 kg) 184 lb 1.6 oz (83.5 kg)    Physical Exam     GEN: Well nourished, well developed, in no acute distress.  HEENT: Grossly normal.  Neck: Supple, no JVD, carotid bruits, or masses. Cardiac: Irregularly irregular, no murmurs, rubs, or gallops.  No clubbing, cyanosis, edema.  Radials/DP/PT 2+ and equal bilaterally.  Respiratory:  Clear to auscultation bilaterally, no wheezes or Rales GI: Soft, nontender, nondistended, BS + x 4. MS: no deformity or atrophy. Skin: warm and dry, no rash. Neuro:  Strength and sensation are intact. Psych: AAOx3.  Normal affect.  Labs    CBC  Recent Labs  03/04/17 0500 03/06/17 0527  WBC 8.7 9.4  HGB 11.4* 11.3*  HCT 34.3* 33.3*  MCV 90.6 89.8  PLT 300 712   Basic Metabolic Panel  Recent Labs  03/05/17 0735 03/06/17 0527  NA 142 144  K 3.2* 3.1*  CL 107 106  CO2 29 32  GLUCOSE 93 89  BUN 24* 24*  CREATININE 0.85 0.84  CALCIUM  8.8* 9.3   Liver Function Tests No results for input(s): AST, ALT, ALKPHOS, BILITOT, PROT, ALBUMIN in the last 72 hours. No results for input(s): LIPASE, AMYLASE in the last 72 hours. Cardiac Enzymes No results for input(s): CKTOTAL, CKMB, CKMBINDEX, TROPONINI in the last 72 hours. BNP Invalid input(s): POCBNP D-Dimer No results for input(s): DDIMER in the last 72 hours. Hemoglobin A1C No results for input(s): HGBA1C in the last 72 hours. Fasting Lipid Panel No results for input(s): CHOL, HDL, LDLCALC, TRIG, CHOLHDL, LDLDIRECT in the last 72 hours. Thyroid Function Tests No results for input(s): TSH, T4TOTAL, T3FREE, THYROIDAB in the last 72 hours.  Invalid input(s): FREET3  Telemetry    Afib - Personally Reviewed  ECG    n/a - Personally Reviewed  Radiology    No results found.  Cardiac Studies   TTE 02/28/2017: Study Conclusions  - Left ventricle: The cavity size was normal. Wall  thickness was increased in a pattern of mild LVH. Systolic function was moderately reduced. The estimated ejection fraction was in the range of 35% to 40%. Diffuse hypokinesis. The study is not technically sufficient to allow evaluation of LV diastolic function. - Aortic valve: There was moderate stenosis by velocity ratio, in setting of reduced ejection fraction/atrial fibrillation. Recommend to reassess serially. - Mitral valve: There was mild regurgitation. - Left atrium: The atrium was severely dilated. - Right atrium: The atrium was mildly dilated. - Tricuspid valve: There was moderate regurgitation. - Pulmonary arteries: Systolic pressure was moderately increased. PA peak pressure: 49 mm Hg (S).  Patient Profile     83 y.o. female with history of acute systolic CHF, breast cancer, hypothyroidism, and HLD who was noted to be in new onset Afib with RVR in the setting of CAP.   Assessment & Plan    1. New onset Afib with RVR: -Continue carvedilol to 25 mg twice a day. Continue  Eliquis 5 mg twice a day  2. Acute systolic CHF: -Carvedilol to 25 mg twice a day -Continue losartan and furosemide at current doses.  3. Cad, s/p PCI,  Positive stress test Cardiac catheterization yesterday with severe proximal RCA disease, stent placed Would continue Plavix 75 mg daily, Would hold aspirin to avoid triple therapy (this was confirmed with Dr. Fletcher Anon. We will call the patient to confirm she is not taking this as she has been discharged and it is on her medication list) Also on Eliquis 5 twice a day Simvastatin Long discussion concerning rehabilitation after stenting She may benefit from cardiac rehabilitation  4. HTN: -Carvedilol and losartan, as above  5. HLD: Goal LDL less than 70  6. AKI: -Improving    Total encounter time more than 35 minutes  Greater than 50% was spent in counseling and coordination of care with the  patient    Signed, Signed, Esmond Plants, MD, Ph.D [facility]

## 2017-03-06 NOTE — Progress Notes (Signed)
Report called to the [facility] and spoke with a nurse name Cecille Rubin.  [phone]

## 2017-03-06 NOTE — Progress Notes (Signed)
Discharge: Pt d/c from room via wheelchair, Family member with the pt. Discharge instructions given to the patient and family members.  No questions from pt, reintegrated to the pt to call or go to the ED for chest discomfort. Pt dressed in street clothes and left with discharge papers and prescriptions in hand. IV d/ced, tele removed and no complaints of pain or discomfort. 

## 2017-03-06 NOTE — Telephone Encounter (Signed)
-----   Message from Minna Merritts, MD sent at 03/06/2017 12:45 PM EDT ----- Can we call the patient and her family to make sure she is NOT on aspirin She should take Plavix daily Also take eliquis 5 mg twice a day Also needs TC M call thx Tg

## 2017-03-07 DIAGNOSIS — I4891 Unspecified atrial fibrillation: Secondary | ICD-10-CM | POA: Diagnosis not present

## 2017-03-07 DIAGNOSIS — J189 Pneumonia, unspecified organism: Secondary | ICD-10-CM | POA: Diagnosis not present

## 2017-03-07 DIAGNOSIS — I25110 Atherosclerotic heart disease of native coronary artery with unstable angina pectoris: Secondary | ICD-10-CM | POA: Diagnosis not present

## 2017-03-07 DIAGNOSIS — I509 Heart failure, unspecified: Secondary | ICD-10-CM | POA: Diagnosis not present

## 2017-03-09 NOTE — Telephone Encounter (Signed)
Patient was DC to SNF not eligible for TCM call for HFU until release from SNF will monitor. Patient DPR to call on release and reschedule. FYI

## 2017-03-10 NOTE — Telephone Encounter (Signed)
Left message for pt to call back  °

## 2017-03-11 ENCOUNTER — Ambulatory Visit (INDEPENDENT_AMBULATORY_CARE_PROVIDER_SITE_OTHER): Payer: MEDICARE | Admitting: Physician Assistant

## 2017-03-11 ENCOUNTER — Inpatient Hospital Stay: Admit: 2017-03-11 | Discharge: 2017-03-11 | Disposition: A | Discharge status: Home / Self Care | Payer: Self-pay

## 2017-03-11 ENCOUNTER — Encounter: Payer: Self-pay | Admitting: Physician Assistant

## 2017-03-11 ENCOUNTER — Telehealth: Payer: Self-pay | Admitting: Physician Assistant

## 2017-03-11 ENCOUNTER — Other Ambulatory Visit
Admission: RE | Admit: 2017-03-11 | Discharge: 2017-03-11 | Disposition: A | Discharge status: Home / Self Care | Payer: No Typology Code available for payment source | Source: Ambulatory Visit | Attending: Physician Assistant | Admitting: Physician Assistant

## 2017-03-11 VITALS — BP 110/62 | HR 67 | Ht 69.0 in | Wt 183.0 lb

## 2017-03-11 DIAGNOSIS — I2510 Atherosclerotic heart disease of native coronary artery without angina pectoris: Secondary | ICD-10-CM

## 2017-03-11 DIAGNOSIS — I4819 Other persistent atrial fibrillation: Secondary | ICD-10-CM

## 2017-03-11 DIAGNOSIS — R195 Other fecal abnormalities: Secondary | ICD-10-CM | POA: Diagnosis not present

## 2017-03-11 DIAGNOSIS — E876 Hypokalemia: Secondary | ICD-10-CM | POA: Diagnosis not present

## 2017-03-11 DIAGNOSIS — I5042 Chronic combined systolic (congestive) and diastolic (congestive) heart failure: Secondary | ICD-10-CM | POA: Diagnosis not present

## 2017-03-11 DIAGNOSIS — I10 Essential (primary) hypertension: Secondary | ICD-10-CM

## 2017-03-11 DIAGNOSIS — I481 Persistent atrial fibrillation: Secondary | ICD-10-CM | POA: Diagnosis not present

## 2017-03-11 DIAGNOSIS — I890 Lymphedema, not elsewhere classified: Secondary | ICD-10-CM

## 2017-03-11 LAB — BASIC METABOLIC PANEL
Anion gap: 8 (ref 5–15)
BUN: 28 mg/dL — ABNORMAL HIGH (ref 6–20)
CHLORIDE: 103 mmol/L (ref 101–111)
CO2: 32 mmol/L (ref 22–32)
CREATININE: 1.25 mg/dL — AB (ref 0.44–1.00)
Calcium: 11.8 mg/dL — ABNORMAL HIGH (ref 8.9–10.3)
GFR calc non Af Amer: 38 mL/min — ABNORMAL LOW (ref >60)
GFR, EST AFRICAN AMERICAN: 44 mL/min — AB (ref >60)
GLUCOSE: 103 mg/dL — AB (ref 65–99)
Potassium: 3.7 mmol/L (ref 3.5–5.1)
Sodium: 143 mmol/L (ref 135–145)

## 2017-03-11 LAB — CBC
HEMATOCRIT: 30.8 % — AB (ref 35.0–47.0)
HEMOGLOBIN: 10.2 g/dL — AB (ref 12.0–16.0)
MCH: 29.8 pg (ref 26.0–34.0)
MCHC: 33.2 g/dL (ref 32.0–36.0)
MCV: 89.8 fL (ref 80.0–100.0)
Platelets: 255 10*3/uL (ref 150–440)
RBC: 3.43 MIL/uL — ABNORMAL LOW (ref 3.80–5.20)
RDW: 16.2 % — ABNORMAL HIGH (ref 11.5–14.5)
WBC: 8 10*3/uL (ref 3.6–11.0)

## 2017-03-11 LAB — MAGNESIUM: Magnesium: 1.8 mg/dL (ref 1.7–2.4)

## 2017-03-11 NOTE — Telephone Encounter (Signed)
Attempted three times to fax letter with medication changes and lab information to [facility], [phone]. Faxed twice to the business office, [phone]. Transmission failed 5 times. S/w Stanton Kidney, RN, who took verbal order for medication changes based on lab results (see lab note). She understands pt needs repeat BMET 5/21 at [facility] and will arrange transportation. Will call granddaughter regarding need for PCP appt.

## 2017-03-11 NOTE — Patient Instructions (Signed)
Medication Instructions:  Your physician has recommended you make the following change in your medication:  STOP taking aspirin   Labwork: CBC, BMET, mag  Testing/Procedures: none  Follow-Up: Your physician recommends that you schedule a follow-up appointment the first week of June with Dr. Fletcher Anon.    Any Other Special Instructions Will Be Listed Below (If Applicable).     If you need a refill on your cardiac medications before your next appointment, please call your pharmacy.

## 2017-03-11 NOTE — Telephone Encounter (Signed)
In office echo ordered for June 7. Pt had echo on May 5 during hospital admission. Per Christell Faith, PA-C, June 7 echo may be cancelled.

## 2017-03-11 NOTE — Progress Notes (Signed)
Cardiology Office Note Date:  03/11/2017  Patient ID:  Stephanie Contreras, DOB 05/16/1929, MRN [id number] PCP:  Coral Spikes, DO  Cardiologist:  Dr. Yvone Neu, MD    Chief Complaint: Hospital follow up  History of Present Illness: Stephanie K Roediger is a 83 y.o. female with history of recently diagnosed Afib on 01/16/2017 on Eliquis, CAD s/p NSTEMI in early 02/2017 with peak troponin of 0.06 s/p PCI/DES to the RCA, chronic combined systolic and diastolic CHF, HTN, HLD, right-sided breast cancer, hypothyroidism, and lymphedema who presents for hospital follow up after recent admission to [facility] from 5/4-5/11 for CAP, NSTEMI, acute systolic CHF, and Afib with RVR.   Recently seen in the ED on 02/24/17 and diagnosed with CAP. At that time troponin negative x 1. Unremarkable CBC. CXR c/w mulifocal PNA with small left-sided pleural effusion. EKG with Afib with RVR, 101 bpm, nonspecific st/t changes. Outpatient follow up advised.   Patient recently established care with Dr. Yvone Neu on 02/26/17 for evaluation of her newly found Afib as above. At that time she was asymptomatic outside of her PNA. She presented to [facility] on 5/4 with increased SOB with chest pressure, found to have HCAP with acute systolic CHF and Afib with RVR. TTE performed on 02/28/17 showed EF 35-40%, diffuse hypokinesis, study not techincally sufficient to allow for LV diastolic function, moderate aortic stenosis, mild mitral regurgitation, left atrium dilated at 37 mm, right atrium mildly dilated, moderate tricuspid regurgitation, PASP 49 mmHg. Troponin peaked at 0.06, BNP 1190. Blood cultures negative x 2. Heart rate was controlled with Coreg. Given her elevated troponin in the setting of HCAP and acute combined CHF she underwent Lexiscan Myoview on 5/8 that was high risk with a moderate in size, moderate in severity, partially reversible defect involving the inferolateral myocardium c/w scar and periinfarct ischemia. There was also a small in size, moderate  in severity, fixed apical defect most likely representing apical thinning and/or artifact and less likely scar. EF < 30%. Because of this, she underwent LHC on 5/10 that showed left main without significant disease, proximal D2 80% (small vessel), proximal to mid RCA 90% stenosed and mid to distal RCA 60% stenosed. She underwent successful PCI/DES to the proximal to mid RCA with 0% residual stenosis. Given she has Afib and needed long term, full-dose anticoagulation it was recommended she be treated with Plavix and Eliquis without aspirin to minimize the risk of bleeding. It appeared she was discharged on triple therapy (ASA 81 mg, Plavix 75 mg, and Eliquis 5 mg bid). It is noted that cardiology attempted to contact the patient to verify that she not take ASA and only Plavix and Eliquis, though it appears we were not successful in reaching the patient.   Discharge labs showed SCr 0.84, K+ 3.1, wbc 9.4, hgb 11.3, plt 285.  She comes in doing well today. No chest pain, SOB, or palpitations. Tolerating inpatient rehab without issues. Review of her medication list indicates she has been continued on triple therapy with ASA 81 mg, Plavix 75 mg, and Eliquis 5 mg bid. She does report some soft stools than normal and they also appear darker than normal, as well as an isolated episode of coughing with a small amount of blood mixed with mucus. No BRBPR, hematuria, or hematemesis. Chronic LE swelling is stable. She does not elevated her legs when sitting in a chair. No adding salt to any food provided. She is accompanied by her granddaughter today.    Past Medical History:  Diagnosis Date  . Breast cancer ([place])    Right  . Chronic combined systolic and diastolic CHF (congestive heart failure) ([place])   . Coronary artery disease   . Essential hypertension 01/16/2017  . Frequent headaches   . Hyperlipidemia 01/16/2017  . Hypothyroidism 01/16/2017  . Kidney stones   . Lymphedema 01/16/2017  . Persistent atrial  fibrillation [facility])     Past Surgical History:  Procedure Laterality Date  . ABDOMINAL HYSTERECTOMY    . BACK SURGERY    . BREAST LUMPECTOMY Right   . CARDIAC CATHETERIZATION    . CHOLECYSTECTOMY    . CORONARY STENT INTERVENTION N/A 03/05/2017   Procedure: Coronary Stent Intervention;  Surgeon: Wellington Hampshire, MD;  Location: [place] [facility];  Service: Cardiovascular;  Laterality: N/A;  . LEFT HEART CATH AND CORONARY ANGIOGRAPHY N/A 03/05/2017   Procedure: Left Heart Cath and Coronary Angiography;  Surgeon: Minna Merritts, MD;  Location: [place] [facility];  Service: Cardiovascular;  Laterality: N/A;  . REPLACEMENT TOTAL KNEE    . TONSILLECTOMY      Current Outpatient Prescriptions  Medication Sig Dispense Refill  . apixaban (ELIQUIS) 5 MG TABS tablet Take 1 tablet (5 mg total) by mouth 2 (two) times daily. 60 tablet 6  . aspirin EC 81 MG EC tablet Take 1 tablet (81 mg total) by mouth daily. 30 tablet 1  . brimonidine-timolol (COMBIGAN) 0.2-0.5 % ophthalmic solution Place 1 drop into both eyes every 12 (twelve) hours.    . Calcium Carb-Cholecalciferol (CALTRATE 600+D3 SOFT PO) Take by mouth 3 (three) times daily before meals.    . carvedilol (COREG) 25 MG tablet Take 1 tablet (25 mg total) by mouth 2 (two) times daily with a meal. 60 tablet 1  . clopidogrel (PLAVIX) 75 MG tablet Take 1 tablet (75 mg total) by mouth daily with breakfast. 30 tablet 1  . feeding supplement, ENSURE ENLIVE, (ENSURE ENLIVE) LIQD Take 237 mLs by mouth 2 (two) times daily between meals. 237 mL 12  . furosemide (LASIX) 40 MG tablet Take 40 mg by mouth daily.    Marland Kitchen guaiFENesin-dextromethorphan (ROBITUSSIN DM) 100-10 MG/5ML syrup Take 5 mLs by mouth every 4 (four) hours as needed for cough. 118 mL 0  . levothyroxine (SYNTHROID, LEVOTHROID) 125 MCG tablet Take 125 mcg by mouth daily.    Marland Kitchen losartan (COZAAR) 25 MG tablet Take 1 tablet (25 mg total) by mouth daily. 30 tablet 1  . Multiple Vitamins-Minerals  (EYE VITAMINS PO) Take by mouth 2 (two) times daily.    . Multiple Vitamins-Minerals (MULTIVITAMIN ADULT PO) Take by mouth. Centrum silver    . polyethylene glycol (MIRALAX / GLYCOLAX) packet Take 17 g by mouth daily. 14 each 0  . potassium chloride SA (K-DUR,KLOR-CON) 20 MEQ tablet Take 2 tablets (40 mEq total) by mouth daily. 90 tablet 0  . simvastatin (ZOCOR) 40 MG tablet Take 40 mg by mouth daily.     No current facility-administered medications for this visit.     Allergies:   Patient has no known allergies.   Social History:  The patient  reports that she has never smoked. She has never used smokeless tobacco. She reports that she does not drink alcohol or use drugs.   Family History:  The patient's family history includes Breast cancer in her sister; Lung cancer in her sister.  ROS:   Review of Systems  Constitutional: Positive for malaise/fatigue. Negative for chills, diaphoresis, fever and weight loss.  HENT: Negative for  congestion.   Eyes: Negative for discharge and redness.  Respiratory: Negative for cough, hemoptysis, sputum production, shortness of breath and wheezing.   Cardiovascular: Positive for leg swelling. Negative for chest pain, palpitations, orthopnea, claudication and PND.  Gastrointestinal: Negative for abdominal pain, blood in stool, heartburn, melena, nausea and vomiting.  Genitourinary: Negative for hematuria.  Musculoskeletal: Negative for falls and myalgias.  Skin: Negative for rash.  Neurological: Negative for dizziness, tingling, tremors, sensory change, speech change, focal weakness, loss of consciousness and weakness.  Endo/Heme/Allergies: Does not bruise/bleed easily.  Psychiatric/Behavioral: Negative for substance abuse. The patient is not nervous/anxious.   All other systems reviewed and are negative.    PHYSICAL EXAM:  VS:  BP 110/62 (BP Location: Left Arm, Patient Position: Sitting, Cuff Size: Normal)   Pulse 67   Ht 5\' 9"  (1.753 m)   Wt 183  lb (83 kg)   BMI 27.02 kg/m  BMI: Body mass index is 27.02 kg/m.  Physical Exam  Constitutional: She is oriented to person, place, and time. She appears well-developed and well-nourished.  HENT:  Head: Normocephalic and atraumatic.  Eyes: Right eye exhibits no discharge. Left eye exhibits no discharge.  Neck: Normal range of motion. No JVD present.  Cardiovascular: Normal rate, S1 normal and S2 normal.  An irregularly irregular rhythm present. Exam reveals no distant heart sounds, no friction rub, no midsystolic click and no opening snap.   Murmur heard.  Harsh midsystolic murmur is present with a grade of 2/6  at the upper right sternal border radiating to the neck Pulmonary/Chest: Effort normal and breath sounds normal. No respiratory distress. She has no decreased breath sounds. She has no wheezes. She has no rales. She exhibits no tenderness.  Abdominal: Soft. She exhibits no distension. There is no tenderness.  Musculoskeletal: She exhibits edema.  1+ pitting LE edema bilaterally with chronic venous stasis changes noted.   Neurological: She is alert and oriented to person, place, and time.  Skin: Skin is warm and dry. No cyanosis. Nails show no clubbing.  Psychiatric: She has a normal mood and affect. Her speech is normal and behavior is normal. Judgment and thought content normal.     EKG:  Was ordered and interpreted by me today. Shows Afib, 67 bpm, TWI V5-V6  Recent Labs: 01/16/2017: TSH 2.66 02/05/2017: ALT 15 02/27/2017: B Natriuretic Peptide 1,190.0 03/06/2017: BUN 24; Creatinine, Ser 0.84; Hemoglobin 11.3; Platelets 285; Potassium 3.1; Sodium 144  01/16/2017: Cholesterol 136; HDL 37.60; LDL Cholesterol 73; Total CHOL/HDL Ratio 4; Triglycerides 129.0; VLDL 25.8   Estimated Creatinine Clearance: 55.3 mL/min (by C-G formula based on SCr of 0.84 mg/dL).   Wt Readings from Last 3 Encounters:  03/11/17 183 lb (83 kg)  03/06/17 184 lb 1.6 oz (83.5 kg)  02/26/17 186 lb (84.4 kg)       Other studies reviewed: Additional studies/records reviewed today include: summarized above  ASSESSMENT AND PLAN:  1. CAD s/p NSTEMI s/p PCI/DES as above: No symptoms concerning for angina at this time. Despite cardiology efforts, she has been continued on triple therapy since her discharge. Per interventional cardiology recommendation, I will stop her ASA 81 mg daily at this time in an effort to decrease her bleeding risk. Continue Plavix 75 mg daily and Eliquis 5 mg bid (in place of ASA). Will check stat cbc this afternoon to evaluate for stable hgb. Continue Coreg 25 mg bid. Consider adding Lipitor 40 mg daily at follow up. No plans for further ischemic evaluation at this  time.   2. Persistent Afib: Remains in rate-controlled Afib at this time with heart rates in the 60s bpm. Asymptomatic. Has not missed any doses of Eliquis since restarting this after her LHC on 5/10. She will need at least 3 full weeks of uninterrupted full-dose anticoagulation prior to undergoing possible DCCV in an effort to restore sinus rhythm, this would be in early June 2018. If her heart rate becomes tachycardic persistently or difficult to control could proceed with TEE/DCCV, though this is not indicated at this time. Continue Coreg 25 mg bid for rate control. Continue Eliquis 5 mg bid given her CHADS2VASc of at least 6 (CHF, HTN, age x 2, vascular disease, sex category).   3. Chronic combined CHF/lymphedema: She does not appear to be volume overloaded at this time. Daily weights. Continue Coreg, losartan, and Lasix at current doses. Consider addition of spironolactone at follow up. Elevated legs when sitting. Consider compression stockings.   4. Hypokalemia: Check bmet today, continue KCl repletion with Lasix. Consider addition of spironolactone as above at follow up.   5. Dark stools: Check stat cbc today. If hgb stable, follow up with PCP.   Disposition: F/u with Dr. Fletcher Anon, MD in 3-4 weeks.   Current medicines  are reviewed at length with the patient today.  The patient did not have any concerns regarding medicines.  Melvern Banker PA-C 03/11/2017 2:34 PM     [address], [place] [address] [phone]

## 2017-03-12 ENCOUNTER — Ambulatory Visit: Payer: MEDICARE | Admitting: Family

## 2017-03-13 ENCOUNTER — Encounter: Payer: Self-pay | Admitting: Endocrinology

## 2017-03-13 NOTE — Telephone Encounter (Signed)
Re-faxed orders for pt to [facility], [phone] for their records. Verbal order was accepted by Stanton Kidney, RN on 5/16 at 5:04pm

## 2017-03-13 NOTE — Addendum Note (Signed)
Addended by: Lucienne Minks F on: 03/13/2017 02:30 PM   Modules accepted: Level of Service, SmartSet

## 2017-03-13 NOTE — Telephone Encounter (Signed)
Notified granddaughter, Leveda Anna, that echo appt has been cancelled. Pt will keep 6/7 appt w/Dr. Fletcher Anon.

## 2017-03-13 NOTE — Telephone Encounter (Signed)
This encounter was created in error - please disregard.

## 2017-03-17 ENCOUNTER — Telehealth: Payer: Self-pay | Admitting: Physician Assistant

## 2017-03-17 ENCOUNTER — Encounter: Payer: Self-pay | Admitting: Family

## 2017-03-17 ENCOUNTER — Ambulatory Visit: Payer: No Typology Code available for payment source | Attending: Family | Admitting: Family

## 2017-03-17 VITALS — BP 142/56 | HR 54 | Resp 20 | Ht 66.0 in | Wt 185.2 lb

## 2017-03-17 DIAGNOSIS — I890 Lymphedema, not elsewhere classified: Secondary | ICD-10-CM | POA: Insufficient documentation

## 2017-03-17 DIAGNOSIS — I2510 Atherosclerotic heart disease of native coronary artery without angina pectoris: Secondary | ICD-10-CM | POA: Insufficient documentation

## 2017-03-17 DIAGNOSIS — I4819 Other persistent atrial fibrillation: Secondary | ICD-10-CM

## 2017-03-17 DIAGNOSIS — Z853 Personal history of malignant neoplasm of breast: Secondary | ICD-10-CM | POA: Diagnosis not present

## 2017-03-17 DIAGNOSIS — Z87442 Personal history of urinary calculi: Secondary | ICD-10-CM | POA: Insufficient documentation

## 2017-03-17 DIAGNOSIS — E039 Hypothyroidism, unspecified: Secondary | ICD-10-CM | POA: Insufficient documentation

## 2017-03-17 DIAGNOSIS — E785 Hyperlipidemia, unspecified: Secondary | ICD-10-CM | POA: Insufficient documentation

## 2017-03-17 DIAGNOSIS — I110 Hypertensive heart disease with heart failure: Secondary | ICD-10-CM | POA: Diagnosis not present

## 2017-03-17 DIAGNOSIS — I5022 Chronic systolic (congestive) heart failure: Secondary | ICD-10-CM

## 2017-03-17 DIAGNOSIS — I481 Persistent atrial fibrillation: Secondary | ICD-10-CM | POA: Insufficient documentation

## 2017-03-17 DIAGNOSIS — I5042 Chronic combined systolic (congestive) and diastolic (congestive) heart failure: Secondary | ICD-10-CM | POA: Insufficient documentation

## 2017-03-17 DIAGNOSIS — Z7902 Long term (current) use of antithrombotics/antiplatelets: Secondary | ICD-10-CM | POA: Insufficient documentation

## 2017-03-17 DIAGNOSIS — G4733 Obstructive sleep apnea (adult) (pediatric): Secondary | ICD-10-CM

## 2017-03-17 NOTE — Telephone Encounter (Signed)
error 

## 2017-03-17 NOTE — Telephone Encounter (Signed)
Reviewed medications changes and lab order with pt's granddaughter, Leveda Anna. She is agreeable w/plan.

## 2017-03-17 NOTE — Progress Notes (Signed)
Patient ID: Stephanie Contreras, female    DOB: 1929/10/03, 83 y.o.   MRN: [id number]  HPI  Stephanie Contreras is an 83 y/o female with a history of sleep apnea, breast cancer, atrial fibrillation, HTN, hyperlipidemia, hypothyroidism, kidney stones, lymphedema and chronic heart failure.  Reviewed echo report done on 02/28/17 which showed an EF of 35-40% along with mild MR, moderate TR and moderately elevated PA pressure of 49 mm Hg. Cardiac catheterization done 03/05/17 which showed severe proximal RCA disease with moderate mid- distal RCA disease. DES successfully placed with 0% residual stenosis.  Admitted 02/27/17 with pneumonia along with HF exacerbation. Cardiology consult obtained and catheterization done and stent placed. Antibiotics were given. Discharged after 7 days. Was in the ED 02/24/17 due to community acquired pneumonia and was treated and discharged.   She presents today for her initial visit with a chief complaint of mild shortness of breath with moderate exertion. She describes this as chronic in nature over the last several months. She has associated fatigue, edema, light-headedness and difficulty sleeping.   Past Medical History:  Diagnosis Date  . Breast cancer ([place])    Right  . Chronic combined systolic and diastolic CHF (congestive heart failure) ([place])   . Coronary artery disease   . Essential hypertension 01/16/2017  . Frequent headaches   . Hyperlipidemia 01/16/2017  . Hypothyroidism 01/16/2017  . Kidney stones   . Lymphedema 01/16/2017  . Persistent atrial fibrillation [facility])    Past Surgical History:  Procedure Laterality Date  . ABDOMINAL HYSTERECTOMY    . BACK SURGERY    . BREAST LUMPECTOMY Right   . CARDIAC CATHETERIZATION    . CHOLECYSTECTOMY    . CORONARY STENT INTERVENTION N/A 03/05/2017   Procedure: Coronary Stent Intervention;  Surgeon: Wellington Hampshire, MD;  Location: [place] CV LAB;  Service: Cardiovascular;  Laterality: N/A;  . LEFT HEART CATH AND CORONARY  ANGIOGRAPHY N/A 03/05/2017   Procedure: Left Heart Cath and Coronary Angiography;  Surgeon: Minna Merritts, MD;  Location: [place] CV LAB;  Service: Cardiovascular;  Laterality: N/A;  . REPLACEMENT TOTAL KNEE    . TONSILLECTOMY     Family History  Problem Relation Age of Onset  . Breast cancer Sister   . Lung cancer Sister    Social History  Substance Use Topics  . Smoking status: Never Smoker  . Smokeless tobacco: Never Used  . Alcohol use No   No Known Allergies Prior to Admission medications   Medication Sig Start Date End Date Taking? Authorizing Provider  apixaban (ELIQUIS) 5 MG TABS tablet Take 1 tablet (5 mg total) by mouth 2 (two) times daily. 02/26/17  Yes Wende Bushy, MD  brimonidine-timolol (COMBIGAN) 0.2-0.5 % ophthalmic solution Place 1 drop into both eyes every 12 (twelve) hours.   Yes [provider]  Calcium Carb-Cholecalciferol (CALTRATE 600+D3 SOFT PO) Take by mouth 3 (three) times daily before meals.   Yes [provider]  carvedilol (COREG) 25 MG tablet Take 1 tablet (25 mg total) by mouth 2 (two) times daily with a meal. 03/06/17  Yes Fritzi Mandes, MD  clopidogrel (PLAVIX) 75 MG tablet Take 1 tablet (75 mg total) by mouth daily with breakfast. 03/06/17  Yes Fritzi Mandes, MD  feeding supplement, ENSURE ENLIVE, (ENSURE ENLIVE) LIQD Take 237 mLs by mouth 2 (two) times daily between meals. 03/06/17  Yes Fritzi Mandes, MD  guaiFENesin-dextromethorphan [facility] DM) 100-10 MG/5ML syrup Take 5 mLs by mouth every 4 (four) hours as needed  for cough. 03/06/17  Yes Fritzi Mandes, MD  levothyroxine (SYNTHROID, LEVOTHROID) 125 MCG tablet Take 125 mcg by mouth daily.   Yes [provider]  losartan (COZAAR) 25 MG tablet Take 1 tablet (25 mg total) by mouth daily. 03/06/17  Yes Fritzi Mandes, MD  magnesium oxide (MAG-OX) 400 MG tablet Take 400 mg by mouth daily.   Yes [provider]  Multiple Vitamins-Minerals (MULTIVITAMIN ADULT PO) Take by mouth.  Centrum silver   Yes [provider]  polyethylene glycol (MIRALAX / GLYCOLAX) packet Take 17 g by mouth daily. 03/06/17  Yes Fritzi Mandes, MD  polyvinyl alcohol (LIQUIFILM TEARS) 1.4 % ophthalmic solution Place 2 drops into both eyes as needed for dry eyes.   Yes [provider]  potassium chloride SA (K-DUR,KLOR-CON) 20 MEQ tablet Take 2 tablets (40 mEq total) by mouth daily. Patient taking differently: Take 10 mEq by mouth daily.  01/20/17  Yes Cook, Jayce G, DO  simvastatin (ZOCOR) 40 MG tablet Take 40 mg by mouth daily.   Yes [provider]  furosemide (LASIX) 40 MG tablet Take 40 mg by mouth daily.    [provider]  Multiple Vitamins-Minerals (EYE VITAMINS PO) Take by mouth 2 (two) times daily.    [provider]   Review of Systems  Constitutional: Positive for fatigue. Negative for appetite change.  HENT: Positive for hearing loss. Negative for congestion, postnasal drip and sore throat.   Eyes: Negative.   Respiratory: Positive for shortness of breath. Negative for cough and chest tightness.   Cardiovascular: Positive for leg swelling. Negative for chest pain and palpitations.  Gastrointestinal: Negative for abdominal distention and abdominal pain.  Endocrine: Negative.   Genitourinary: Negative.   Musculoskeletal: Negative for back pain and neck pain.  Skin: Negative.   Allergic/Immunologic: Negative.   Neurological: Positive for light-headedness. Negative for dizziness.  Hematological: Negative for adenopathy. Does not bruise/bleed easily.  Psychiatric/Behavioral: Positive for sleep disturbance (not sleeping well). Negative for dysphoric mood. The patient is not nervous/anxious.    Vitals:   03/17/17 1242  BP: (!) 142/56  Pulse: (!) 54  Resp: 20  SpO2: 98%  Weight: 185 lb 4 oz (84 kg)  Height: 5\' 6"  (1.676 m)   Wt Readings from Last 3 Encounters:  03/17/17 185 lb 4 oz (84 kg)  03/11/17 183 lb (83 kg)  03/06/17 184 lb 1.6 oz  (83.5 kg)   Lab Results  Component Value Date   CREATININE 1.25 (H) 03/11/2017   CREATININE 0.84 03/06/2017   CREATININE 0.85 03/05/2017   Physical Exam  Constitutional: She is oriented to person, place, and time. She appears well-developed and well-nourished.  HENT:  Head: Normocephalic and atraumatic.  Right Ear: Decreased hearing is noted.  Left Ear: Decreased hearing is noted.  Neck: Normal range of motion. Neck supple. No JVD present.  Cardiovascular: An irregular rhythm present. Bradycardia present.   Pulmonary/Chest: Effort normal. She has no wheezes. She has no rales.  Abdominal: Soft. She exhibits no distension. There is no tenderness.  Musculoskeletal: She exhibits edema (2+ pitting edema in bilateral lower legs with L>R). She exhibits no tenderness.  Neurological: She is alert and oriented to person, place, and time.  Skin: Skin is warm and dry.  Psychiatric: She has a normal mood and affect. Her behavior is normal. Thought content normal.  Nursing note and vitals reviewed.     Assessment & Plan:  1: Chronic heart failure with reduced ejection fraction- - NYHA class II -  euvolemic today (lymphedema present in lower legs) - not currently being weighed daily at rehab so an order was written to be weighed daily and call for an overnight weight gain of >2 pounds or a weekly weight gain of >5 pounds. Hoping to go home on 03/23/17 and she has scales at home already - not adding salt to her food. Discussed the importance of closely following a 2000mg  sodium diet and written information was given to her about this - supposed to be wearing TED hose but she says that they are too tight and hurt her legs - admits to not elevating her legs much at rehab but has a recliner at her granddaughter's home where she will be staying at and will try to elevate her legs more often - granddaughter says that patient's diuretic is currently being held due to lab results at facility - sees  cardiologist Fletcher Anon) 04/02/17  2: Atrial fibrillation- - recent diagnosis - currently on apixaban and clopidogrel - discussion about cardioversion in the future  3: Obstructive sleep apnea- - currently scheduled for a sleep study on 03/24/17 - sees PCP Lacinda Axon) 03/31/17  Facility medication list was reviewed.  Return here in 1 month or sooner for any questions/problems before then.

## 2017-03-17 NOTE — Patient Instructions (Signed)
Begin weighing daily and call for an overnight weight gain of > 2 pounds or a weekly weight gain of >5 pounds.  Maintain fluid intake to 40-50 ounces daily.

## 2017-03-18 ENCOUNTER — Encounter: Payer: Self-pay | Admitting: Family

## 2017-03-18 DIAGNOSIS — I5023 Acute on chronic systolic (congestive) heart failure: Secondary | ICD-10-CM | POA: Insufficient documentation

## 2017-03-20 ENCOUNTER — Telehealth: Payer: Self-pay | Admitting: Family

## 2017-03-20 ENCOUNTER — Ambulatory Visit: Payer: MEDICARE | Admitting: Family Medicine

## 2017-03-20 NOTE — Telephone Encounter (Signed)
Home health called to say that patient reported a 2.2 pound weight gain overnight. No worsening of symptoms.  Advised agency that if her weight stays up or goes up anymore tomorrow, to take an additional 40mg  furosemide along with an additional 19meq potassium tablet. If her weight comes back down tomorrow, she is to continue her medications as she has been doing.

## 2017-03-24 ENCOUNTER — Telehealth: Payer: Self-pay | Admitting: Physician Assistant

## 2017-03-24 ENCOUNTER — Other Ambulatory Visit: Payer: Self-pay

## 2017-03-24 DIAGNOSIS — E876 Hypokalemia: Secondary | ICD-10-CM

## 2017-03-24 DIAGNOSIS — I110 Hypertensive heart disease with heart failure: Secondary | ICD-10-CM | POA: Diagnosis not present

## 2017-03-24 DIAGNOSIS — I4891 Unspecified atrial fibrillation: Secondary | ICD-10-CM | POA: Diagnosis not present

## 2017-03-24 DIAGNOSIS — M6281 Muscle weakness (generalized): Secondary | ICD-10-CM | POA: Diagnosis not present

## 2017-03-24 DIAGNOSIS — I5022 Chronic systolic (congestive) heart failure: Secondary | ICD-10-CM | POA: Diagnosis not present

## 2017-03-24 DIAGNOSIS — G4733 Obstructive sleep apnea (adult) (pediatric): Secondary | ICD-10-CM | POA: Diagnosis not present

## 2017-03-24 DIAGNOSIS — I2510 Atherosclerotic heart disease of native coronary artery without angina pectoris: Secondary | ICD-10-CM | POA: Diagnosis not present

## 2017-03-24 NOTE — Telephone Encounter (Signed)
During review of recent labs today w/pt's granddaughter, [name], she asks if pt should be taking both plavix and eliquis. Plavix added during recent hospitalization and pt d/c'd on both. Plavix added after stent placed; eliquis for afib. Per Dr. Donivan Scull notes when he saw pt during hospitalization: "3. Cad, s/p PCI,  Positive stress test Cardiac catheterization yesterday with severe proximal RCA disease, stent placed Would continue Plavix 75 mg daily, Would hold aspirin to avoid triple therapy (this was confirmed with Dr. Fletcher Anon. We will call the patient to confirm she is not taking this as she has been discharged and it is on her medication list) Also on Eliquis 5 twice a day" Reviewed with Ignacia Bayley, NP, who is agreeable to have pt continue eliquis and plavix. Informed pt's daughter, [name], who is agreeable w/plan.

## 2017-03-25 ENCOUNTER — Telehealth: Payer: Self-pay | Admitting: Family Medicine

## 2017-03-25 ENCOUNTER — Telehealth: Payer: Self-pay | Admitting: Cardiology

## 2017-03-25 DIAGNOSIS — M6281 Muscle weakness (generalized): Secondary | ICD-10-CM | POA: Diagnosis not present

## 2017-03-25 DIAGNOSIS — I4891 Unspecified atrial fibrillation: Secondary | ICD-10-CM | POA: Diagnosis not present

## 2017-03-25 DIAGNOSIS — I5022 Chronic systolic (congestive) heart failure: Secondary | ICD-10-CM | POA: Diagnosis not present

## 2017-03-25 DIAGNOSIS — I2510 Atherosclerotic heart disease of native coronary artery without angina pectoris: Secondary | ICD-10-CM | POA: Diagnosis not present

## 2017-03-25 DIAGNOSIS — G4733 Obstructive sleep apnea (adult) (pediatric): Secondary | ICD-10-CM | POA: Diagnosis not present

## 2017-03-25 DIAGNOSIS — I110 Hypertensive heart disease with heart failure: Secondary | ICD-10-CM | POA: Diagnosis not present

## 2017-03-25 NOTE — Telephone Encounter (Signed)
[facility] with [employer], [phone]. She wants to know if Dr. Lacinda Axon will be signing papers for pt to have home health therapy. She is faxing over papers today.

## 2017-03-25 NOTE — Telephone Encounter (Signed)
Spoke at length with patients daughter. She states that her mother just moved in with her on Monday and since then she has not been going to the bathroom as much. She states that they discontinued furosemide previously at the rehab center due to some kidney issues. She reports that she did speak with someone yesterday and states that her mother is to have some labs done next week. She wanted to know if those could be done at the patients primary care physician and let her know that should be fine. Instructed her to monitor patients daily weights and keep a log of those. Also to keep a log of blood pressure readings and bring those lists to her upcoming appointments. Her daughter verbalized understanding of our conversation, agreement with plan, and had no further questions at this time.

## 2017-03-25 NOTE — Telephone Encounter (Signed)
Pt granddaughter is having issues going to the restroom She states we took her off lasix last week in rehab  They have no placed her back on it yet but they had her on 10 mg of potassium now she is on 80 mg for a week since it was low in rehab not sure if this could be causing the issue.   But she is not sure what to do, she is stating to them she is not able to go as much as she normally does   Please advise.

## 2017-03-25 NOTE — Telephone Encounter (Signed)
FYI

## 2017-03-27 DIAGNOSIS — I2510 Atherosclerotic heart disease of native coronary artery without angina pectoris: Secondary | ICD-10-CM | POA: Diagnosis not present

## 2017-03-27 DIAGNOSIS — G4733 Obstructive sleep apnea (adult) (pediatric): Secondary | ICD-10-CM | POA: Diagnosis not present

## 2017-03-27 DIAGNOSIS — I110 Hypertensive heart disease with heart failure: Secondary | ICD-10-CM | POA: Diagnosis not present

## 2017-03-27 DIAGNOSIS — M6281 Muscle weakness (generalized): Secondary | ICD-10-CM | POA: Diagnosis not present

## 2017-03-27 DIAGNOSIS — I5022 Chronic systolic (congestive) heart failure: Secondary | ICD-10-CM | POA: Diagnosis not present

## 2017-03-27 DIAGNOSIS — I4891 Unspecified atrial fibrillation: Secondary | ICD-10-CM | POA: Diagnosis not present

## 2017-03-28 ENCOUNTER — Emergency Department: Payer: MEDICARE

## 2017-03-28 ENCOUNTER — Emergency Department
Admission: EM | Admit: 2017-03-28 | Discharge: 2017-03-28 | Disposition: A | Discharge status: Home / Self Care | Payer: MEDICARE | Attending: Emergency Medicine | Admitting: Emergency Medicine

## 2017-03-28 ENCOUNTER — Encounter: Payer: Self-pay | Admitting: Emergency Medicine

## 2017-03-28 DIAGNOSIS — E875 Hyperkalemia: Secondary | ICD-10-CM | POA: Diagnosis not present

## 2017-03-28 DIAGNOSIS — Z853 Personal history of malignant neoplasm of breast: Secondary | ICD-10-CM | POA: Insufficient documentation

## 2017-03-28 DIAGNOSIS — Z955 Presence of coronary angioplasty implant and graft: Secondary | ICD-10-CM | POA: Insufficient documentation

## 2017-03-28 DIAGNOSIS — Z79899 Other long term (current) drug therapy: Secondary | ICD-10-CM | POA: Diagnosis not present

## 2017-03-28 DIAGNOSIS — E039 Hypothyroidism, unspecified: Secondary | ICD-10-CM | POA: Diagnosis not present

## 2017-03-28 DIAGNOSIS — Z7902 Long term (current) use of antithrombotics/antiplatelets: Secondary | ICD-10-CM | POA: Diagnosis not present

## 2017-03-28 DIAGNOSIS — I110 Hypertensive heart disease with heart failure: Secondary | ICD-10-CM | POA: Insufficient documentation

## 2017-03-28 DIAGNOSIS — Z96659 Presence of unspecified artificial knee joint: Secondary | ICD-10-CM | POA: Insufficient documentation

## 2017-03-28 DIAGNOSIS — Z7901 Long term (current) use of anticoagulants: Secondary | ICD-10-CM | POA: Insufficient documentation

## 2017-03-28 DIAGNOSIS — I5020 Unspecified systolic (congestive) heart failure: Secondary | ICD-10-CM

## 2017-03-28 DIAGNOSIS — I5042 Chronic combined systolic (congestive) and diastolic (congestive) heart failure: Secondary | ICD-10-CM | POA: Insufficient documentation

## 2017-03-28 DIAGNOSIS — R0602 Shortness of breath: Secondary | ICD-10-CM | POA: Diagnosis not present

## 2017-03-28 LAB — CBC
HCT: 28.4 % — ABNORMAL LOW (ref 35.0–47.0)
HEMOGLOBIN: 9.2 g/dL — AB (ref 12.0–16.0)
MCH: 29.8 pg (ref 26.0–34.0)
MCHC: 32.5 g/dL (ref 32.0–36.0)
MCV: 91.8 fL (ref 80.0–100.0)
Platelets: 226 10*3/uL (ref 150–440)
RBC: 3.09 MIL/uL — AB (ref 3.80–5.20)
RDW: 17.7 % — ABNORMAL HIGH (ref 11.5–14.5)
WBC: 6.4 10*3/uL (ref 3.6–11.0)

## 2017-03-28 LAB — COMPREHENSIVE METABOLIC PANEL
ALT: 15 U/L (ref 14–54)
ANION GAP: 5 (ref 5–15)
AST: 18 U/L (ref 15–41)
Albumin: 2.9 g/dL — ABNORMAL LOW (ref 3.5–5.0)
Alkaline Phosphatase: 46 U/L (ref 38–126)
BUN: 17 mg/dL (ref 6–20)
CHLORIDE: 112 mmol/L — AB (ref 101–111)
CO2: 25 mmol/L (ref 22–32)
Calcium: 9.8 mg/dL (ref 8.9–10.3)
Creatinine, Ser: 1.28 mg/dL — ABNORMAL HIGH (ref 0.44–1.00)
GFR calc non Af Amer: 37 mL/min — ABNORMAL LOW (ref >60)
GFR, EST AFRICAN AMERICAN: 43 mL/min — AB (ref >60)
Glucose, Bld: 97 mg/dL (ref 65–99)
POTASSIUM: 5.8 mmol/L — AB (ref 3.5–5.1)
SODIUM: 142 mmol/L (ref 135–145)
Total Bilirubin: 1.2 mg/dL (ref 0.3–1.2)
Total Protein: 6.5 g/dL (ref 6.5–8.1)

## 2017-03-28 LAB — BASIC METABOLIC PANEL
ANION GAP: 7 (ref 5–15)
BUN: 16 mg/dL (ref 6–20)
CO2: 24 mmol/L (ref 22–32)
Calcium: 9.6 mg/dL (ref 8.9–10.3)
Chloride: 110 mmol/L (ref 101–111)
Creatinine, Ser: 1.32 mg/dL — ABNORMAL HIGH (ref 0.44–1.00)
GFR calc non Af Amer: 35 mL/min — ABNORMAL LOW (ref >60)
GFR, EST AFRICAN AMERICAN: 41 mL/min — AB (ref >60)
Glucose, Bld: 94 mg/dL (ref 65–99)
Potassium: 4.5 mmol/L (ref 3.5–5.1)
Sodium: 141 mmol/L (ref 135–145)

## 2017-03-28 LAB — CBC WITH DIFFERENTIAL/PLATELET
BASOS ABS: 0 10*3/uL (ref 0–0.1)
BASOS PCT: 0 %
Eosinophils Absolute: 0.2 10*3/uL (ref 0–0.7)
Eosinophils Relative: 3 %
HEMATOCRIT: 29 % — AB (ref 35.0–47.0)
HEMOGLOBIN: 9.4 g/dL — AB (ref 12.0–16.0)
Lymphocytes Relative: 18 %
Lymphs Abs: 1.2 10*3/uL (ref 1.0–3.6)
MCH: 30 pg (ref 26.0–34.0)
MCHC: 32.4 g/dL (ref 32.0–36.0)
MCV: 92.6 fL (ref 80.0–100.0)
Monocytes Absolute: 0.7 10*3/uL (ref 0.2–0.9)
Monocytes Relative: 11 %
NEUTROS PCT: 68 %
Neutro Abs: 4.5 10*3/uL (ref 1.4–6.5)
Platelets: 212 10*3/uL (ref 150–440)
RBC: 3.13 MIL/uL — AB (ref 3.80–5.20)
RDW: 18 % — AB (ref 11.5–14.5)
WBC: 6.6 10*3/uL (ref 3.6–11.0)

## 2017-03-28 LAB — TROPONIN I

## 2017-03-28 LAB — BRAIN NATRIURETIC PEPTIDE: B Natriuretic Peptide: 1557 pg/mL — ABNORMAL HIGH (ref 0.0–100.0)

## 2017-03-28 MED ORDER — FUROSEMIDE 10 MG/ML IJ SOLN
20.0000 mg | Freq: Once | INTRAMUSCULAR | Status: DC
  Filled 2017-03-28: qty 4

## 2017-03-28 MED ORDER — FUROSEMIDE 10 MG/ML IJ SOLN
40.0000 mg | Freq: Once | INTRAMUSCULAR | Status: AC
  Administered 2017-03-28: 40 mg via INTRAVENOUS

## 2017-03-28 NOTE — ED Notes (Addendum)
Patient's O2 sats remained around 91-95% while ambulating with walking device.  MD [place] notified.

## 2017-03-28 NOTE — ED Notes (Signed)
Daughter at bedside.

## 2017-03-28 NOTE — ED Provider Notes (Signed)
[facility] Emergency Department Provider Note   ____________________________________________   First MD Initiated Contact with Patient 03/28/17 0302     (approximate)  I have reviewed the triage vital signs and the nursing notes.   HISTORY  Chief Complaint Shortness of Breath    HPI Stephanie Contreras is a 83 y.o. female who had a stent placed 2-1/2 weeks ago. She is a understand was in rehabilitation and then discharged to home. She has a history of congestive heart failure was taken off her Lasix about 2 weeks ago because of increasing creatinine. Last couple nights she's been waking up complaining of chest fullness and difficulty breathing when she lays down for a while. Tonight it was worse than usual. When that happened her O2 saturation was 96% heart rate was between 90 and 108. She's had no real weight gain. History is provided by family member. Patient does have edema. Left leg is always bigger than the right one.   Past Medical History:  Diagnosis Date  . Breast cancer ([place])    Right  . Chronic combined systolic and diastolic CHF (congestive heart failure) ([place])   . Coronary artery disease   . Essential hypertension 01/16/2017  . Frequent headaches   . Hyperlipidemia 01/16/2017  . Hypothyroidism 01/16/2017  . Kidney stones   . Lymphedema 01/16/2017  . Persistent atrial fibrillation ([place])   . Sleep apnea     Patient Active Problem List   Diagnosis Date Noted  . Chronic systolic heart failure ([place]) 03/18/2017  . Unstable angina ([place])   . CAP (community acquired pneumonia) 02/27/2017  . Atrial fibrillation ([place]) 02/27/2017  . OSA (obstructive sleep apnea) 02/16/2017  . Hypercalcemia 02/10/2017  . History of breast cancer 01/16/2017  . Lymphedema 01/16/2017  . Essential hypertension 01/16/2017  . Hyperlipidemia 01/16/2017  . Hypothyroidism 01/16/2017  . Venous stasis 01/16/2017  . Glaucoma 01/16/2017    Past Surgical History:  Procedure  Laterality Date  . ABDOMINAL HYSTERECTOMY    . BACK SURGERY    . BREAST LUMPECTOMY Right   . CARDIAC CATHETERIZATION    . CHOLECYSTECTOMY    . CORONARY STENT INTERVENTION N/A 03/05/2017   Procedure: Coronary Stent Intervention;  Surgeon: Wellington Hampshire, MD;  Location: [facility];  Service: Cardiovascular;  Laterality: N/A;  . LEFT HEART CATH AND CORONARY ANGIOGRAPHY N/A 03/05/2017   Procedure: Left Heart Cath and Coronary Angiography;  Surgeon: Minna Merritts, MD;  Location: [facility];  Service: Cardiovascular;  Laterality: N/A;  . REPLACEMENT TOTAL KNEE    . TONSILLECTOMY      Prior to Admission medications   Medication Sig Start Date End Date Taking? Authorizing Provider  apixaban (ELIQUIS) 5 MG TABS tablet Take 1 tablet (5 mg total) by mouth 2 (two) times daily. 02/26/17   Wende Bushy, MD  brimonidine-timolol (COMBIGAN) 0.2-0.5 % ophthalmic solution Place 1 drop into both eyes every 12 (twelve) hours.    [provider]  Calcium Carb-Cholecalciferol (CALTRATE 600+D3 SOFT PO) Take by mouth 3 (three) times daily before meals.    [provider]  carvedilol (COREG) 25 MG tablet Take 1 tablet (25 mg total) by mouth 2 (two) times daily with a meal. 03/06/17   Fritzi Mandes, MD  clopidogrel (PLAVIX) 75 MG tablet Take 1 tablet (75 mg total) by mouth daily with breakfast. 03/06/17   Fritzi Mandes, MD  feeding supplement, ENSURE ENLIVE, (ENSURE ENLIVE) LIQD Take 237 mLs by mouth 2 (two) times  daily between meals. 03/06/17   Fritzi Mandes, MD  furosemide (LASIX) 40 MG tablet Take 40 mg by mouth daily.    [provider]  guaiFENesin-dextromethorphan (ROBITUSSIN DM) 100-10 MG/5ML syrup Take 5 mLs by mouth every 4 (four) hours as needed for cough. 03/06/17   Fritzi Mandes, MD  levothyroxine (SYNTHROID, LEVOTHROID) 125 MCG tablet Take 125 mcg by mouth daily.    [provider]  losartan (COZAAR) 25 MG tablet Take 1 tablet (25 mg total) by mouth daily.  03/06/17   Fritzi Mandes, MD  magnesium oxide (MAG-OX) 400 MG tablet Take 400 mg by mouth daily.    [provider]  Multiple Vitamins-Minerals (EYE VITAMINS PO) Take by mouth 2 (two) times daily.    [provider]  Multiple Vitamins-Minerals (MULTIVITAMIN ADULT PO) Take by mouth. Centrum silver    [provider]  polyethylene glycol (MIRALAX / GLYCOLAX) packet Take 17 g by mouth daily. 03/06/17   Fritzi Mandes, MD  polyvinyl alcohol (LIQUIFILM TEARS) 1.4 % ophthalmic solution Place 2 drops into both eyes as needed for dry eyes.    [provider]  potassium chloride SA (K-DUR,KLOR-CON) 20 MEQ tablet Take 2 tablets (40 mEq total) by mouth daily. Patient taking differently: Take 10 mEq by mouth daily.  01/20/17   Coral Spikes, DO  simvastatin (ZOCOR) 40 MG tablet Take 40 mg by mouth daily.    [provider]    Allergies Patient has no known allergies.  Family History  Problem Relation Age of Onset  . Breast cancer Sister   . Lung cancer Sister     Social History Social History  Substance Use Topics  . Smoking status: Never Smoker  . Smokeless tobacco: Never Used  . Alcohol use No    Review of Systems  Constitutional: No fever/chills Eyes: No visual changes. ENT: No sore throat. Cardiovascular: Denies chest pain. Respiratory: shortness of breath. Gastrointestinal: No abdominal pain.  No nausea, no vomiting.  No diarrhea.  No constipation. Genitourinary: Negative for dysuria. Musculoskeletal: Negative for back pain. Skin: Negative for rash. Neurological: Negative for headaches, focal weakness or numbness.   ____________________________________________   PHYSICAL EXAM:  VITAL SIGNS: ED Triage Vitals  Enc Vitals Group     BP 03/28/17 0305 128/76     Pulse Rate 03/28/17 0305 90     Resp 03/28/17 0305 (!) 24     Temp 03/28/17 0305 97.7 F (36.5 C)     Temp Source 03/28/17 0305 Oral     SpO2 03/28/17 0305 96 %     Weight  03/28/17 0306 187 lb (84.8 kg)     Height 03/28/17 0306 5\' 6"  (1.676 m)     Head Circumference --      Peak Flow --      Pain Score --      Pain Loc --      Pain Edu? --      Excl. in [place]? --    Constitutional: Alert and oriented. Well appearing and in no acute distress. Eyes: Conjunctivae are normal. PERRL. EOMI. Head: Atraumatic. Nose: No congestion/rhinnorhea. Mouth/Throat: Mucous membranes are moist.  Oropharynx non-erythematous. Neck: No stridor.  Cardiovascular: Normal rate, regular rhythm. Grossly normal heart sounds.  Good peripheral circulation. Respiratory: Normal respiratory effort.  No retractions. Lungs crackles scattered throughout Gastrointestinal: Soft and nontender. No distention. No abdominal bruits. No CVA tenderness. }Musculoskeletal: No lower extremity tenderness bilateral edema left worse than right and this is chronic Neurologic:  Normal  speech and language. No gross focal neurologic deficits are appreciated.  Skin:  Skin is warm, dry and intact. No rash noted. Psychiatric: Mood and affect are normal. Speech and behavior are normal.  ____________________________________________   LABS (all labs ordered are listed, but only abnormal results are displayed)  Labs Reviewed  COMPREHENSIVE METABOLIC PANEL - Abnormal; Notable for the following:       Result Value   Potassium 5.8 (*)    Chloride 112 (*)    Creatinine, Ser 1.28 (*)    Albumin 2.9 (*)    GFR calc non Af Amer 37 (*)    GFR calc Af Amer 43 (*)    All other components within normal limits  BRAIN NATRIURETIC PEPTIDE - Abnormal; Notable for the following:    B Natriuretic Peptide 1,557.0 (*)    All other components within normal limits  CBC WITH DIFFERENTIAL/PLATELET - Abnormal; Notable for the following:    RBC 3.13 (*)    Hemoglobin 9.4 (*)    HCT 29.0 (*)    RDW 18.0 (*)    All other components within normal limits  TROPONIN I  BASIC METABOLIC PANEL  CBC    ____________________________________________  EKG  EKG read and interpreted by me shows atrial fibrillation rate of 79 normal axis no obvious ST-T wave changes ____________________________________________  RADIOLOGY  IMPRESSION: Small left pleural effusion. Vascular congestion and cardiomegaly. Left basilar airspace opacity raises concern for pneumonia, though mild interstitial edema cannot be excluded.   Electronically Signed   By: Garald Balding M.D. ____________________________________________   PROCEDURES  Procedure(s) performed:  Procedures  Critical Care performed:   ____________________________________________   INITIAL IMPRESSION / ASSESSMENT AND PLAN / ED COURSE  Pertinent labs & imaging results that were available during my care of the patient were reviewed by me and considered in my medical decision making (see chart for details).  Further history obtained from the family is that patient had been on Lasix but that was stopped because her potassium was going down in her potassium dose was doubled to 80 mg a day. The patient's been off of Lasix and on a double dose potassium for at least a week. Probably closer to 2. Plan for now is to see how she does with the Lasix I gave her and then probably restart her Lasix every other day and decrease the potassium and have her follow-up with her doctor Monday or Tuesday. Patient walked did not descend below 91.      ____________________________________________   FINAL CLINICAL IMPRESSION(S) / ED DIAGNOSES  Final diagnoses:  Systolic congestive heart failure, unspecified HF chronicity (HCC)  Hyperkalemia      NEW MEDICATIONS STARTED DURING THIS VISIT:  New Prescriptions   No medications on file     Note:  This document was prepared using Dragon voice recognition software and may include unintentional dictation errors.    Nena Polio, MD 03/28/17 [phone]

## 2017-03-28 NOTE — ED Triage Notes (Signed)
Pt presents to ED with c/o respiratory distress for last night with dry cough. Peripheral edema noted.

## 2017-03-28 NOTE — Discharge Instructions (Signed)
Take the Lasix one pill every other day. So the first one you would take would be tomorrow the third (Sunday). Cut the potassium in half - only take 40 a day instead of 80 day. Follow-up with your regular doctor on Tuesday. I would recommend checking the BMP to find out what the potassium and the creatinine levels are checking the CBC to find out if your hematocrit has fallen or not. Please return for worse shortness of breath fever chills or feeling sicker at all or any other problems.

## 2017-03-30 ENCOUNTER — Ambulatory Visit: Payer: MEDICARE | Admitting: Internal Medicine

## 2017-03-30 DIAGNOSIS — I4891 Unspecified atrial fibrillation: Secondary | ICD-10-CM | POA: Diagnosis not present

## 2017-03-30 DIAGNOSIS — G4733 Obstructive sleep apnea (adult) (pediatric): Secondary | ICD-10-CM | POA: Diagnosis not present

## 2017-03-30 DIAGNOSIS — M6281 Muscle weakness (generalized): Secondary | ICD-10-CM | POA: Diagnosis not present

## 2017-03-30 DIAGNOSIS — I5022 Chronic systolic (congestive) heart failure: Secondary | ICD-10-CM | POA: Diagnosis not present

## 2017-03-30 DIAGNOSIS — I110 Hypertensive heart disease with heart failure: Secondary | ICD-10-CM | POA: Diagnosis not present

## 2017-03-30 DIAGNOSIS — I2510 Atherosclerotic heart disease of native coronary artery without angina pectoris: Secondary | ICD-10-CM | POA: Diagnosis not present

## 2017-03-31 ENCOUNTER — Encounter: Payer: Self-pay | Admitting: Family Medicine

## 2017-03-31 ENCOUNTER — Ambulatory Visit (INDEPENDENT_AMBULATORY_CARE_PROVIDER_SITE_OTHER): Payer: MEDICARE | Admitting: Family Medicine

## 2017-03-31 ENCOUNTER — Ambulatory Visit: Payer: MEDICARE | Attending: Neurology

## 2017-03-31 VITALS — BP 126/76 | HR 51 | Temp 98.5°F | Resp 16 | Wt 190.5 lb

## 2017-03-31 DIAGNOSIS — G4733 Obstructive sleep apnea (adult) (pediatric): Secondary | ICD-10-CM | POA: Insufficient documentation

## 2017-03-31 DIAGNOSIS — I5023 Acute on chronic systolic (congestive) heart failure: Secondary | ICD-10-CM | POA: Diagnosis not present

## 2017-03-31 DIAGNOSIS — I2510 Atherosclerotic heart disease of native coronary artery without angina pectoris: Secondary | ICD-10-CM | POA: Diagnosis not present

## 2017-03-31 DIAGNOSIS — Z1211 Encounter for screening for malignant neoplasm of colon: Secondary | ICD-10-CM

## 2017-03-31 DIAGNOSIS — D649 Anemia, unspecified: Secondary | ICD-10-CM

## 2017-03-31 DIAGNOSIS — R0683 Snoring: Secondary | ICD-10-CM | POA: Diagnosis not present

## 2017-03-31 LAB — COMPREHENSIVE METABOLIC PANEL
ALT: 11 U/L (ref 0–35)
AST: 12 U/L (ref 0–37)
Albumin: 2.8 g/dL — ABNORMAL LOW (ref 3.5–5.2)
Alkaline Phosphatase: 45 U/L (ref 39–117)
BILIRUBIN TOTAL: 0.9 mg/dL (ref 0.2–1.2)
BUN: 20 mg/dL (ref 6–23)
CHLORIDE: 109 meq/L (ref 96–112)
CO2: 28 meq/L (ref 19–32)
Calcium: 9.9 mg/dL (ref 8.4–10.5)
Creatinine, Ser: 1.22 mg/dL — ABNORMAL HIGH (ref 0.40–1.20)
GFR: 44.41 mL/min — ABNORMAL LOW (ref >60.00)
GLUCOSE: 98 mg/dL (ref 70–99)
Potassium: 4.3 mEq/L (ref 3.5–5.1)
Sodium: 142 mEq/L (ref 135–145)
Total Protein: 6.3 g/dL (ref 6.0–8.3)

## 2017-03-31 LAB — CBC
HCT: 27.9 % — ABNORMAL LOW (ref 36.0–46.0)
HEMOGLOBIN: 9 g/dL — AB (ref 12.0–15.0)
MCHC: 32.3 g/dL (ref 30.0–36.0)
MCV: 91.8 fl (ref 78.0–100.0)
Platelets: 250 10*3/uL (ref 150.0–400.0)
RBC: 3.04 Mil/uL — ABNORMAL LOW (ref 3.87–5.11)
RDW: 17.8 % — AB (ref 11.5–15.5)
WBC: 5.9 10*3/uL (ref 4.0–10.5)

## 2017-03-31 NOTE — Progress Notes (Signed)
Subjective:  Patient ID: Stephanie Contreras, female    DOB: 08/05/1929  Age: 83 y.o. MRN: [id number]  CC: Follow up  HPI:  83 year old female with recent new onset atrial fibrillation, new onset CHF, coronary artery disease status post PCI of the right coronary artery, OSA, hypothyroidism, hyperlipidemia presents for follow-up.  She is accompanied by her son and granddaughter today. Since her last visit she's been in the ER twice and has been admitted once. On 5/1 she was in the emergency department for community-acquired pneumonia. She was treated and discharged home. She followed up with cardiology on 5/3. She subsequently presented to the ED on 5/4. She presented with nausea, vomiting, shortness of breath. Found to have multifocal pneumonia and new onset congestive heart failure. During hospitalization, her A. fib was rate controlled. She underwent a stress test which was high risk. Subsequent catheterization revealed a 90% stenosis of the proximal to mid RCA. She underwent PCI. She was discharged home on Eliquis and Plavix. Finished course of antibiotics for pneumonia. Was discharged to a skilled nursing facility.  Per the granddaughter, in the skilled nursing facility, her Lasix was discontinued secondary to elevated creatinine. She's also had issues related to low potassium. She was seen in the ED on 6/2 with worsening shortness of breath. She was put back on Lasix and is currently on Lasix 20 mg every other day.  Patient presents today for follow-up. She and her family endorse shortness of breath with exertion, orthopnea. Worsening lower extremity edema. Additionally, upon review of her chart she's had declining hemoglobin. She had a normal hemoglobin on May 1 and her hemoglobin is currently 9.2. She remains on Eliquis and Plavix. Family concerned about worsening shortness of breath. Also concerned about potassium.  Social Hx   Social History   Social History  . Marital status: Widowed   Spouse name: N/A  . Number of children: N/A  . Years of education: N/A   Social History Main Topics  . Smoking status: Never Smoker  . Smokeless tobacco: Never Used  . Alcohol use No  . Drug use: No  . Sexual activity: Not Asked   Other Topics Concern  . None   Social History Narrative  . None    Review of Systems  Respiratory: Positive for shortness of breath.   Cardiovascular: Positive for leg swelling.   Objective:  BP 126/76 (BP Location: Left Arm, Patient Position: Sitting, Cuff Size: Normal)   Pulse (!) 51   Temp 98.5 F (36.9 C) (Oral)   Resp 16   Wt 190 lb 8 oz (86.4 kg)   SpO2 92%   BMI 30.75 kg/m   BP/Weight 03/31/2017 03/28/2017 04/26/7792  Systolic BP 903 009 233  Diastolic BP 76 86 56  Wt. (Lbs) 190.5 187 185.25  BMI 30.75 30.18 29.9   Physical Exam  Constitutional: She appears well-developed. No distress.  Cardiovascular: An irregular rhythm present.  2-3+ pitting lower extremity edema.  Pulmonary/Chest:  Left basilar crackles.  Neurological: She is alert.  Psychiatric: She has a normal mood and affect.  Vitals reviewed.   Lab Results  Component Value Date   WBC 6.4 03/28/2017   HGB 9.2 (L) 03/28/2017   HCT 28.4 (L) 03/28/2017   PLT 226 03/28/2017   GLUCOSE 94 03/28/2017   CHOL 136 01/16/2017   TRIG 129.0 01/16/2017   HDL 37.60 (L) 01/16/2017   LDLCALC 73 01/16/2017   ALT 15 03/28/2017   AST 18 03/28/2017   NA 141 03/28/2017  K 4.5 03/28/2017   CL 110 03/28/2017   CREATININE 1.32 (H) 03/28/2017   BUN 16 03/28/2017   CO2 24 03/28/2017   TSH 2.66 01/16/2017   INR 1.60 03/04/2017    Assessment & Plan:   Problem List Items Addressed This Visit      Cardiovascular and Mediastinum   Acute on chronic systolic congestive heart failure ([place]) - Primary (Chronic)    New problem (to me). Discussed case with cardiology, Dr. Rockey Situ. Per our discussion, I'm increasing her Lasix to 40 mg daily 3 days. She has follow-up with cardiology in 2  days. Continue other medications at this time.      Relevant Orders   Comprehensive metabolic panel     Other   Anemia    New problem. Suspect slow blood loss related to anticoagulation. Family reports that she had a negative Hemoccult in the emergency room. Sending her home with stool cards. Working up anemia with laboratory studies today.      Relevant Orders   CBC   Iron, TIBC and Ferritin Panel    Other Visit Diagnoses    Encounter for screening fecal occult blood testing       Relevant Orders   Fecal occult blood, imunochemical      Meds ordered this encounter  Medications  . DISCONTD: potassium chloride (K-DUR) 10 MEQ tablet    Sig: once daily.     Follow-up: 2 weeks  >30 minutes were spent face-to-face with the patient during this encounter and over half of that time was spent discussing her case (new onset CHF, CAD, etc), medications. I also Discussed her case with cardiology.   [place]

## 2017-03-31 NOTE — Assessment & Plan Note (Signed)
New problem. Suspect slow blood loss related to anticoagulation. Family reports that she had a negative Hemoccult in the emergency room. Sending her home with stool cards. Working up anemia with laboratory studies today.

## 2017-03-31 NOTE — Patient Instructions (Signed)
Lasix 40 mg daily x 3 days.  We will call with the lab results.  Follow up in 2 weeks.  Take care  Dr. Lacinda Axon

## 2017-03-31 NOTE — Assessment & Plan Note (Signed)
New problem (to me). Discussed case with cardiology, Dr. Rockey Situ. Per our discussion, I'm increasing her Lasix to 40 mg daily 3 days. She has follow-up with cardiology in 2 days. Continue other medications at this time.

## 2017-04-01 ENCOUNTER — Telehealth: Payer: Self-pay | Admitting: *Deleted

## 2017-04-01 LAB — IRON,TIBC AND FERRITIN PANEL
%SAT: 8 % — AB (ref 11–50)
Ferritin: 134 ng/mL (ref 20–288)
Iron: 18 ug/dL — ABNORMAL LOW (ref 45–160)
TIBC: 230 ug/dL — AB (ref 250–450)

## 2017-04-01 NOTE — Telephone Encounter (Signed)
Please advise, thanks.

## 2017-04-01 NOTE — Telephone Encounter (Signed)
Patient's granddaughter questioned if she left the log for weight and blood pressure behind after yesterdays visit. If so please fax to [phone].

## 2017-04-02 ENCOUNTER — Encounter: Payer: Self-pay | Admitting: Cardiovascular Disease

## 2017-04-02 ENCOUNTER — Ambulatory Visit (INDEPENDENT_AMBULATORY_CARE_PROVIDER_SITE_OTHER): Payer: MEDICARE | Admitting: Cardiovascular Disease

## 2017-04-02 ENCOUNTER — Other Ambulatory Visit: Payer: MEDICARE

## 2017-04-02 VITALS — BP 132/68 | HR 84 | Ht 68.0 in | Wt 187.5 lb

## 2017-04-02 DIAGNOSIS — I350 Nonrheumatic aortic (valve) stenosis: Secondary | ICD-10-CM

## 2017-04-02 DIAGNOSIS — I5022 Chronic systolic (congestive) heart failure: Secondary | ICD-10-CM | POA: Diagnosis not present

## 2017-04-02 DIAGNOSIS — I4891 Unspecified atrial fibrillation: Secondary | ICD-10-CM | POA: Diagnosis not present

## 2017-04-02 DIAGNOSIS — G4733 Obstructive sleep apnea (adult) (pediatric): Secondary | ICD-10-CM | POA: Diagnosis not present

## 2017-04-02 DIAGNOSIS — I10 Essential (primary) hypertension: Secondary | ICD-10-CM

## 2017-04-02 DIAGNOSIS — I2510 Atherosclerotic heart disease of native coronary artery without angina pectoris: Secondary | ICD-10-CM | POA: Diagnosis not present

## 2017-04-02 DIAGNOSIS — I110 Hypertensive heart disease with heart failure: Secondary | ICD-10-CM | POA: Diagnosis not present

## 2017-04-02 DIAGNOSIS — M6281 Muscle weakness (generalized): Secondary | ICD-10-CM | POA: Diagnosis not present

## 2017-04-02 MED ORDER — FUROSEMIDE 40 MG PO TABS: 40.0000 mg | ORAL_TABLET | Freq: Every day | ORAL | 2 refills | Status: DC

## 2017-04-02 NOTE — Telephone Encounter (Signed)
Spoke with Leveda Anna granddaughter and advised not at office.   States she has appointment at cardiologist today.

## 2017-04-02 NOTE — Telephone Encounter (Signed)
Not that I am aware of.

## 2017-04-02 NOTE — Patient Instructions (Signed)
Medication Instructions:  Your physician has recommended you make the following change in your medication:  HOLD eliquis until further notice CONTINUE lasix 40mg  once daily. Your prescription has been sent to [facility].   Labwork: BMET and CBC in one week at the [facility] outpatient lab. No appointment necessary  Testing/Procedures: none  Follow-Up: Your physician recommends that you schedule a follow-up appointment in: one month with Dr. Fletcher Anon.    Any Other Special Instructions Will Be Listed Below (If Applicable).     If you need a refill on your cardiac medications before your next appointment, please call your pharmacy.

## 2017-04-02 NOTE — Progress Notes (Signed)
Cardiology Office Note   Date:  04/02/2017   ID:  Stephanie Contreras, DOB October 21, 1929, MRN [id number]  PCP:  Coral Spikes, DO  Cardiologist:   Kathlyn Sacramento, MD   Chief Complaint  Patient presents with  . other    Follow up from [facility]; CHF, cardiac cath s/p stent placement and A-Fib. Meds reviewed by the pt. verbally.       History of Present Illness: Stephanie Contreras is a 83 y.o. female who presents for a follow-up visit regarding atrial fibrillation , CAD and chronic systolic heart failure. She was seen last month by Dr. Yvone Neu for newly diagnosed atrial fibrillation. She was started on Eliquis 5 mg twice daily.  She has other chronic medical conditions that include history of breast cancer, hypothyroidism and hyperlipidemia. She was admitted shortly after with pneumonia and mildly elevated troponin. She had an echocardiogram done which showed an EF of 35-40%, moderate aortic stenosis, mild mitral regurgitation, moderate tricuspid regurgitation and moderate pulmonary hypertension. She underwent a pharmacologic nuclear stress test which was high risk with inferior and inferolateral scar with peri-infarct ischemia with ejection fraction of less than 30%. She underwent cardiac catheterization which showed 90% mid RCA stenosis, 60% distal RCA stenosis and no significant disease affecting the left system. I performed successful angioplasty and drug-eluting stent placement to the midright coronary artery. She went to the emergency room recently with worsening shortness of breath and was noted to be mildly volume overloaded.This happened after she was taken off furosemide due to worsening renal function. She reports being in a diuretic for a long time even before her recent diagnosis of heart failure. She was given one dose of IV furosemide in the emergency room with subsequent improvement in symptoms. She was discharged home on furosemide 20 mg every other day at shortness of breath and orthopnea  worsened again. She saw Dr. Lacinda Axon on Tuesday and the dose of furosemide was increased to 40 mg once daily. Since then, the patient reports improvement in her symptoms. Of note also she was noted to have progressive anemia with a hemoglobin of 9. This has gradually decreased and it was 13.12 months ago. She has no signs of active bleeding.   Past Medical History:  Diagnosis Date  . Breast cancer ([place])    Right  . Chronic combined systolic and diastolic CHF (congestive heart failure) ([place])   . Coronary artery disease   . Essential hypertension 01/16/2017  . Frequent headaches   . Hyperlipidemia 01/16/2017  . Hypothyroidism 01/16/2017  . Kidney stones   . Lymphedema 01/16/2017  . Persistent atrial fibrillation ([place])   . Sleep apnea     Past Surgical History:  Procedure Laterality Date  . ABDOMINAL HYSTERECTOMY    . BACK SURGERY    . BREAST LUMPECTOMY Right   . CARDIAC CATHETERIZATION    . CHOLECYSTECTOMY    . CORONARY STENT INTERVENTION N/A 03/05/2017   Procedure: Coronary Stent Intervention;  Surgeon: Wellington Hampshire, MD;  Location: [facility];  Service: Cardiovascular;  Laterality: N/A;  . LEFT HEART CATH AND CORONARY ANGIOGRAPHY N/A 03/05/2017   Procedure: Left Heart Cath and Coronary Angiography;  Surgeon: Minna Merritts, MD;  Location: [place] [facility];  Service: Cardiovascular;  Laterality: N/A;  . REPLACEMENT TOTAL KNEE    . TONSILLECTOMY       Current Outpatient Prescriptions  Medication Sig Dispense Refill  . brimonidine-timolol (COMBIGAN) 0.2-0.5 % ophthalmic solution Place 1 drop into both  eyes every 12 (twelve) hours.    . Calcium Carb-Cholecalciferol (CALTRATE 600+D3 SOFT PO) Take by mouth 3 (three) times daily before meals.    . carvedilol (COREG) 25 MG tablet Take 1 tablet (25 mg total) by mouth 2 (two) times daily with a meal. 60 tablet 1  . clopidogrel (PLAVIX) 75 MG tablet Take 1 tablet (75 mg total) by mouth daily with breakfast. 30 tablet 1  .  feeding supplement, ENSURE ENLIVE, (ENSURE ENLIVE) LIQD Take 237 mLs by mouth 2 (two) times daily between meals. 237 mL 12  . furosemide (LASIX) 40 MG tablet Take 1 tablet (40 mg total) by mouth daily. 90 tablet 2  . levothyroxine (SYNTHROID, LEVOTHROID) 125 MCG tablet Take 125 mcg by mouth daily.    Marland Kitchen losartan (COZAAR) 25 MG tablet Take 1 tablet (25 mg total) by mouth daily. 30 tablet 1  . Multiple Vitamins-Minerals (MULTIVITAMIN ADULT PO) Take by mouth. Centrum silver    . polyethylene glycol (MIRALAX / GLYCOLAX) packet Take 17 g by mouth daily. 14 each 0  . polyvinyl alcohol (LIQUIFILM TEARS) 1.4 % ophthalmic solution Place 2 drops into both eyes as needed for dry eyes.    . potassium chloride SA (K-DUR,KLOR-CON) 20 MEQ tablet Take 2 tablets (40 mEq total) by mouth daily. (Patient taking differently: Take 10 mEq by mouth daily. ) 90 tablet 0  . simvastatin (ZOCOR) 40 MG tablet Take 40 mg by mouth daily.     No current facility-administered medications for this visit.     Allergies:   Patient has no known allergies.    Social History:  The patient  reports that she has never smoked. She has never used smokeless tobacco. She reports that she does not drink alcohol or use drugs.   Family History:  The patient's family history includes Breast cancer in her sister; Lung cancer in her sister.    ROS:  Please see the history of present illness.   Otherwise, review of systems are positive for none.   All other systems are reviewed and negative.    PHYSICAL EXAM: VS:  BP 132/68 (BP Location: Left Arm, Patient Position: Sitting, Cuff Size: Normal)   Pulse 84   Ht 5\' 8"  (1.727 m)   Wt 187 lb 8 oz (85 kg)   BMI 28.51 kg/m  , BMI Body mass index is 28.51 kg/m. GEN: Well nourished, well developed, in no acute distress  HEENT: normal  Neck: Moderate JVD, no carotid bruits, or masses Cardiac:Irregularly irregular; no  rubs, or gallops. 2/6 systolic ejection murmur at the aortic area. She has  moderate bilateral leg edema Respiratory:  Bibasilar crackles worse at the left base, normal work of breathing GI: soft, nontender, nondistended, + BS MS: no deformity or atrophy  Skin: warm and dry, no rash Neuro:  Strength and sensation are intact Psych: euthymic mood, full affect   EKG:  EKG is ordered today. The ekg ordered today demonstrates atrial fibrillation with nonspecific ST changes.   Recent Labs: 01/16/2017: TSH 2.66 03/11/2017: Magnesium 1.8 03/28/2017: B Natriuretic Peptide 1,557.0 03/31/2017: ALT 11; BUN 20; Creatinine, Ser 1.22; Hemoglobin 9.0; Platelets 250.0; Potassium 4.3; Sodium 142    Lipid Panel    Component Value Date/Time   CHOL 136 01/16/2017 1116   TRIG 129.0 01/16/2017 1116   HDL 37.60 (L) 01/16/2017 1116   CHOLHDL 4 01/16/2017 1116   VLDL 25.8 01/16/2017 1116   LDLCALC 73 01/16/2017 1116      Wt Readings from Last  3 Encounters:  04/02/17 187 lb 8 oz (85 kg)  03/31/17 190 lb 8 oz (86.4 kg)  03/28/17 187 lb (84.8 kg)      PAD Screen 02/26/2017  Previous PAD dx? No  Previous surgical procedure? No  Pain with walking? No  Feet/toe relief with dangling? No  Painful, non-healing ulcers? No  Extremities discolored? No      ASSESSMENT AND PLAN:  1.  Chronic systolic heart failure: The patient had recent issues with volume overload. She is currently New York Heart Association class III. By physical exam, she still appears to be volume overloaded. Thus, I elected to keep her on the same dose of furosemide 40 mg once daily. Check basic metabolic profile in one week to make sure her renal function is stable. Continue treatment with carvedilol and losartan. We can consider switching to [facility] in the future.  2. Coronary artery disease involving native coronary arteries without angina: She had successful angioplasty and drug-eluting stent placement to the right coronary artery. Continue treatment with Plavix.  3. Persistent atrial fibrillation:  Ventricular rate is controlled on current dose of carvedilol. I am concerned about her progressive anemia and she has dropped 4 g in 2 months. She might not be able to tolerate being on Plavix and Eliquis at the same time. She has no signs of active bleeding. I elected to hold Eliquis for now until we get her hemoglobin at least above 10. Check CBC in one week.  4. Moderate aortic stenosis: Continue to monitor.    Disposition:   FU with me in 1 month  Signed,  Kathlyn Sacramento, MD  04/02/2017 4:00 PM    [place]

## 2017-04-03 ENCOUNTER — Telehealth: Payer: Self-pay | Admitting: *Deleted

## 2017-04-03 ENCOUNTER — Other Ambulatory Visit (INDEPENDENT_AMBULATORY_CARE_PROVIDER_SITE_OTHER): Payer: MEDICARE

## 2017-04-03 ENCOUNTER — Other Ambulatory Visit: Payer: Self-pay

## 2017-04-03 ENCOUNTER — Other Ambulatory Visit: Payer: Self-pay | Admitting: Family Medicine

## 2017-04-03 DIAGNOSIS — Z1211 Encounter for screening for malignant neoplasm of colon: Secondary | ICD-10-CM

## 2017-04-03 LAB — FECAL OCCULT BLOOD, IMMUNOCHEMICAL: Fecal Occult Bld: NEGATIVE

## 2017-04-03 MED ORDER — FERROUS SULFATE 325 (65 FE) MG PO TBEC: 325.0000 mg | DELAYED_RELEASE_TABLET | Freq: Two times a day (BID) | ORAL | 3 refills | Status: AC

## 2017-04-03 MED ORDER — FERROUS SULFATE 325 (65 FE) MG PO TBEC: 325.0000 mg | DELAYED_RELEASE_TABLET | Freq: Two times a day (BID) | ORAL | 3 refills | Status: DC

## 2017-04-06 NOTE — Telephone Encounter (Signed)
Rx sent to pharmacy, see lab result

## 2017-04-07 ENCOUNTER — Telehealth: Payer: Self-pay | Admitting: Cardiovascular Disease

## 2017-04-07 ENCOUNTER — Other Ambulatory Visit: Payer: Self-pay

## 2017-04-07 DIAGNOSIS — I2510 Atherosclerotic heart disease of native coronary artery without angina pectoris: Secondary | ICD-10-CM | POA: Diagnosis not present

## 2017-04-07 DIAGNOSIS — I110 Hypertensive heart disease with heart failure: Secondary | ICD-10-CM | POA: Diagnosis not present

## 2017-04-07 DIAGNOSIS — M6281 Muscle weakness (generalized): Secondary | ICD-10-CM | POA: Diagnosis not present

## 2017-04-07 DIAGNOSIS — I5022 Chronic systolic (congestive) heart failure: Secondary | ICD-10-CM | POA: Diagnosis not present

## 2017-04-07 DIAGNOSIS — G4733 Obstructive sleep apnea (adult) (pediatric): Secondary | ICD-10-CM | POA: Diagnosis not present

## 2017-04-07 DIAGNOSIS — I4891 Unspecified atrial fibrillation: Secondary | ICD-10-CM | POA: Diagnosis not present

## 2017-04-07 MED ORDER — POTASSIUM CHLORIDE CRYS ER 20 MEQ PO TBCR: 20.0000 meq | EXTENDED_RELEASE_TABLET | Freq: Two times a day (BID) | ORAL | 0 refills | Status: DC

## 2017-04-07 MED ORDER — LOSARTAN POTASSIUM 25 MG PO TABS: 25.0000 mg | ORAL_TABLET | Freq: Every day | ORAL | 1 refills | Status: DC

## 2017-04-07 MED ORDER — CARVEDILOL 25 MG PO TABS: 25.0000 mg | ORAL_TABLET | Freq: Two times a day (BID) | ORAL | 1 refills | Status: DC

## 2017-04-07 MED ORDER — CLOPIDOGREL BISULFATE 75 MG PO TABS: 75.0000 mg | ORAL_TABLET | Freq: Every day | ORAL | 1 refills | Status: DC

## 2017-04-07 NOTE — Telephone Encounter (Signed)
Patient has labs this week. Patient is aware we will wait to send in potassium in until we get those results.   Requested Prescriptions   Signed Prescriptions Disp Refills  . carvedilol (COREG) 25 MG tablet 60 tablet 1    Sig: Take 1 tablet (25 mg total) by mouth 2 (two) times daily with a meal.    Authorizing Provider: Kathlyn Sacramento A    Ordering User: Janan Ridge  . clopidogrel (PLAVIX) 75 MG tablet 30 tablet 1    Sig: Take 1 tablet (75 mg total) by mouth daily with breakfast.    Authorizing Provider: Kathlyn Sacramento A    Ordering User: Janan Ridge losartan (COZAAR) 25 MG tablet 30 tablet 1    Sig: Take 1 tablet (25 mg total) by mouth daily.    Authorizing Provider: Kathlyn Sacramento A    Ordering User: Janan Ridge

## 2017-04-07 NOTE — Telephone Encounter (Signed)
Requested Prescriptions   Signed Prescriptions Disp Refills  . carvedilol (COREG) 25 MG tablet 60 tablet 1    Sig: Take 1 tablet (25 mg total) by mouth 2 (two) times daily with a meal.    Authorizing Provider: Kathlyn Sacramento A    Ordering User: Janan Ridge  . clopidogrel (PLAVIX) 75 MG tablet 30 tablet 1    Sig: Take 1 tablet (75 mg total) by mouth daily with breakfast.    Authorizing Provider: Kathlyn Sacramento A    Ordering User: Janan Ridge losartan (COZAAR) 25 MG tablet 30 tablet 1    Sig: Take 1 tablet (25 mg total) by mouth daily.    Authorizing Provider: Kathlyn Sacramento A    Ordering User: Janan Ridge

## 2017-04-07 NOTE — Telephone Encounter (Signed)
S/w pt's granddaughter, [name], regarding medication refill requests and need for clarification of potassium dosage. Medication list indicates 63mEq QD. Granddaughter confirms pt has been taking potassium 62mEq BID since June 2.  Potassium 4.3 on June 5. Also reports pt takes Preservision Areds 1 tablet BID for vision. She does not take miralax; pt takes exlax 2 tablets at bedtime. Medication list updated. Will review potassium dosage with Dr. Fletcher Anon.

## 2017-04-07 NOTE — Telephone Encounter (Signed)
°*  STAT* If patient is at the pharmacy, call can be transferred to refill team.   1. Which medications need to be refilled? (please list name of each medication and dose if known)   Coreg  Plavix Losartan  Potassium   2. Which pharmacy/location (including street and city if local pharmacy) is medication to be sent to?  [facility]   3. Do they need a 30 day or 90 day supply?  90 day

## 2017-04-07 NOTE — Telephone Encounter (Signed)
Pt's granddaughter, [name], states pt has enough potassium to last through next week. She is aware we will refill other medications but wait to send potassium refill to [facility] based on this weeks labs.

## 2017-04-08 ENCOUNTER — Ambulatory Visit: Payer: MEDICARE | Attending: Neurology

## 2017-04-08 ENCOUNTER — Ambulatory Visit: Payer: MEDICARE | Admitting: Internal Medicine

## 2017-04-08 ENCOUNTER — Telehealth: Payer: Self-pay | Admitting: Family Medicine

## 2017-04-08 DIAGNOSIS — R0683 Snoring: Secondary | ICD-10-CM | POA: Insufficient documentation

## 2017-04-08 DIAGNOSIS — G4733 Obstructive sleep apnea (adult) (pediatric): Secondary | ICD-10-CM | POA: Diagnosis not present

## 2017-04-08 NOTE — Addendum Note (Signed)
Addended by: Georgiana Shore on: 04/08/2017 11:33 AM   Modules accepted: Orders

## 2017-04-08 NOTE — Telephone Encounter (Signed)
Pt Granddaughter called to get results from sleep study on 03/31/2017. Please advise?  Call [place] @ [phone]. Thank you!

## 2017-04-08 NOTE — Telephone Encounter (Signed)
Sleep study is in patient chart. Is there anything important that is needed to explain to granddaughter other than OSA and CPAP need according to the report.Marland KitchenMarland Kitchen

## 2017-04-09 ENCOUNTER — Other Ambulatory Visit: Payer: Self-pay | Admitting: Family Medicine

## 2017-04-09 ENCOUNTER — Other Ambulatory Visit: Payer: Self-pay | Admitting: *Deleted

## 2017-04-09 ENCOUNTER — Other Ambulatory Visit
Admission: RE | Admit: 2017-04-09 | Discharge: 2017-04-09 | Disposition: A | Discharge status: Home / Self Care | Payer: MEDICARE | Source: Ambulatory Visit | Attending: Cardiovascular Disease | Admitting: Cardiovascular Disease

## 2017-04-09 DIAGNOSIS — I10 Essential (primary) hypertension: Secondary | ICD-10-CM | POA: Diagnosis not present

## 2017-04-09 DIAGNOSIS — E876 Hypokalemia: Secondary | ICD-10-CM

## 2017-04-09 DIAGNOSIS — I350 Nonrheumatic aortic (valve) stenosis: Secondary | ICD-10-CM | POA: Insufficient documentation

## 2017-04-09 LAB — BASIC METABOLIC PANEL
Anion gap: 7 (ref 5–15)
BUN: 16 mg/dL (ref 6–20)
CHLORIDE: 105 mmol/L (ref 101–111)
CO2: 26 mmol/L (ref 22–32)
CREATININE: 1.01 mg/dL — AB (ref 0.44–1.00)
Calcium: 9 mg/dL (ref 8.9–10.3)
GFR calc non Af Amer: 49 mL/min — ABNORMAL LOW (ref >60)
GFR, EST AFRICAN AMERICAN: 57 mL/min — AB (ref >60)
Glucose, Bld: 124 mg/dL — ABNORMAL HIGH (ref 65–99)
Potassium: 3.3 mmol/L — ABNORMAL LOW (ref 3.5–5.1)
Sodium: 138 mmol/L (ref 135–145)

## 2017-04-09 LAB — CBC
HCT: 29.9 % — ABNORMAL LOW (ref 35.0–47.0)
Hemoglobin: 9.7 g/dL — ABNORMAL LOW (ref 12.0–16.0)
MCH: 28.9 pg (ref 26.0–34.0)
MCHC: 32.3 g/dL (ref 32.0–36.0)
MCV: 89.5 fL (ref 80.0–100.0)
Platelets: 245 K/uL (ref 150–440)
RBC: 3.34 MIL/uL — ABNORMAL LOW (ref 3.80–5.20)
RDW: 18.4 % — ABNORMAL HIGH (ref 11.5–14.5)
WBC: 6.3 K/uL (ref 3.6–11.0)

## 2017-04-09 NOTE — Telephone Encounter (Signed)
Spoke with [name] patient had titration study last night due opening.

## 2017-04-09 NOTE — Telephone Encounter (Signed)
Has severe OSA. Needs titration study as split study was not completed due to severity.

## 2017-04-13 ENCOUNTER — Telehealth: Payer: Self-pay | Admitting: Cardiovascular Disease

## 2017-04-13 DIAGNOSIS — M6281 Muscle weakness (generalized): Secondary | ICD-10-CM | POA: Diagnosis not present

## 2017-04-13 DIAGNOSIS — I4891 Unspecified atrial fibrillation: Secondary | ICD-10-CM | POA: Diagnosis not present

## 2017-04-13 DIAGNOSIS — I5022 Chronic systolic (congestive) heart failure: Secondary | ICD-10-CM | POA: Diagnosis not present

## 2017-04-13 DIAGNOSIS — I110 Hypertensive heart disease with heart failure: Secondary | ICD-10-CM | POA: Diagnosis not present

## 2017-04-13 DIAGNOSIS — G4733 Obstructive sleep apnea (adult) (pediatric): Secondary | ICD-10-CM | POA: Diagnosis not present

## 2017-04-13 DIAGNOSIS — I2510 Atherosclerotic heart disease of native coronary artery without angina pectoris: Secondary | ICD-10-CM | POA: Diagnosis not present

## 2017-04-13 NOTE — Telephone Encounter (Signed)
She merely needs the titration study for the settings. Then she just need the device at those settings.

## 2017-04-13 NOTE — Telephone Encounter (Signed)
No answer. Left message to call back.   

## 2017-04-13 NOTE — Telephone Encounter (Signed)
Patient  Was felt to be volume overloaded on 6/7 with Dr. Fletcher Anon. Weight at that time of 187 pounds. Has been noting increase in weight from that time up to 190 pounds today. Some associated SOB. I suspect she is still somewhat volume overloaded. Please have her increase Lasix to 40 mg bid x 3 days followed by return to prior dose of 40 mg daily. She is already taking KCl 40 mEq bid while on bid dosing of Lasix. When she goes back to Lasix 40 mg daily she can return to her prior KCl dose.   Regarding her SOB, I cannot rule out some component of worsening anemia. Recommend she follow up with PCP regarding her anemia. Recent PCI in 02/2017. Has persistent Afib. Eliquis held as of 6/7 given continued decline in hgb.   Continue to check bmet on 6/22.

## 2017-04-13 NOTE — Telephone Encounter (Signed)
Received incoming call from [place], [place]. She verbalized understanding of how to take furosemide and potassium and to get BMP on 04/17/17.

## 2017-04-13 NOTE — Telephone Encounter (Signed)
Pt granddaughter calling stating pt has been having more issues with her breathing night before last she's had hard time catching her breath States yesterday was also a bad day for her Pt also has had a 2 pound increase  She was around 188-189 today she is 190 Would like to know what to do Breathing issues doesn't happened all the time but when it does come it wears patient out Please advise

## 2017-04-13 NOTE — Telephone Encounter (Signed)
Pt granddaughter wants to know what's the next step after pt got the titration? Please advise?  Call pt @ [phone]. [name]

## 2017-04-13 NOTE — Telephone Encounter (Signed)
Returned call to [employer], listed on DPR. She is concerned because patient has had 2 lb. Weight gain in 1 day. Daily weights are as follows: 6/6 186.3 lb 6/7 186.8 lb 6/8 187.4 lb  6/9 188.3 lb 6/10 189 lb 6/11 187.5 lb 6/12 188.4 lb 6/13 188 lb 6/14 Sleep center no weight 6/15 189 lb 6/16 188 lb 6/17 188 lb  6/18 190 lb  Patient has been drinking 50 oz. Of water a day. She has SOB on and off throughout the day sometimes at rest and sometimes with exertion. Physical therapist was there today and states patient is doing well with walking around the house with minimal SOB. Finds she gets more SOB when doing exercises at the kitchen sink or sitting in a chair. BP 112/60, HR 74, R 20, 94-97% RA per PT today.  Feet usually have some swelling and they are her normal today. Grand-daughter would like to know how much fluid patient should be drinking daily. Patient potassium was decreased 3.3 on 04/09/17 and has repeat BMP on Friday. Will route to Christell Faith, PA for advice as Dr Fletcher Anon is out of the office.

## 2017-04-14 ENCOUNTER — Other Ambulatory Visit: Payer: Self-pay | Admitting: Family Medicine

## 2017-04-14 ENCOUNTER — Telehealth: Payer: Self-pay

## 2017-04-14 DIAGNOSIS — H9190 Unspecified hearing loss, unspecified ear: Secondary | ICD-10-CM

## 2017-04-14 NOTE — Telephone Encounter (Signed)
Pt granddaughter [name] called back returning your call. Please advise, thank you!  Call [place] @ [phone]

## 2017-04-14 NOTE — Telephone Encounter (Signed)
Left voice mail to call back 

## 2017-04-14 NOTE — Telephone Encounter (Signed)
[name] daughter advised of below and verbalized understanding.

## 2017-04-14 NOTE — Telephone Encounter (Signed)
[name] granddaughter calls requesting referral to audiologist due to patient has lost hearing aides .  She states patient can't hear right ear .  Would like your recommendations to local audiologist.

## 2017-04-14 NOTE — Telephone Encounter (Signed)
Spoke with [facility] titration study done on 04/08/17 results not ready yet.  They will send over when completed.   CPAP orders will be sent over for completion by [facility].

## 2017-04-15 DIAGNOSIS — I4891 Unspecified atrial fibrillation: Secondary | ICD-10-CM | POA: Diagnosis not present

## 2017-04-15 DIAGNOSIS — I110 Hypertensive heart disease with heart failure: Secondary | ICD-10-CM | POA: Diagnosis not present

## 2017-04-15 DIAGNOSIS — I5022 Chronic systolic (congestive) heart failure: Secondary | ICD-10-CM | POA: Diagnosis not present

## 2017-04-15 DIAGNOSIS — G4733 Obstructive sleep apnea (adult) (pediatric): Secondary | ICD-10-CM | POA: Diagnosis not present

## 2017-04-15 DIAGNOSIS — M6281 Muscle weakness (generalized): Secondary | ICD-10-CM | POA: Diagnosis not present

## 2017-04-15 DIAGNOSIS — I2510 Atherosclerotic heart disease of native coronary artery without angina pectoris: Secondary | ICD-10-CM | POA: Diagnosis not present

## 2017-04-17 ENCOUNTER — Other Ambulatory Visit
Admission: RE | Admit: 2017-04-17 | Discharge: 2017-04-17 | Disposition: A | Discharge status: Home / Self Care | Payer: MEDICARE | Source: Ambulatory Visit | Attending: Cardiovascular Disease | Admitting: Cardiovascular Disease

## 2017-04-17 DIAGNOSIS — E876 Hypokalemia: Secondary | ICD-10-CM | POA: Insufficient documentation

## 2017-04-17 LAB — BASIC METABOLIC PANEL
ANION GAP: 5 (ref 5–15)
BUN: 16 mg/dL (ref 6–20)
CALCIUM: 9.5 mg/dL (ref 8.9–10.3)
CO2: 31 mmol/L (ref 22–32)
CREATININE: 1.07 mg/dL — AB (ref 0.44–1.00)
Chloride: 103 mmol/L (ref 101–111)
GFR calc Af Amer: 53 mL/min — ABNORMAL LOW (ref >60)
GFR, EST NON AFRICAN AMERICAN: 46 mL/min — AB (ref >60)
GLUCOSE: 103 mg/dL — AB (ref 65–99)
Potassium: 3.8 mmol/L (ref 3.5–5.1)
Sodium: 139 mmol/L (ref 135–145)

## 2017-04-20 ENCOUNTER — Telehealth: Payer: Self-pay | Admitting: *Deleted

## 2017-04-20 ENCOUNTER — Ambulatory Visit: Payer: MEDICARE | Admitting: Family

## 2017-04-20 NOTE — Telephone Encounter (Signed)
Pt's granddaughter requested to know if results were available [place] [phone]

## 2017-04-20 NOTE — Telephone Encounter (Signed)
Most recent labs that I did reflected iron deficiency. She did have recent labs from [facility] which were unremarkable.

## 2017-04-20 NOTE — Telephone Encounter (Signed)
Not sure what she is referring to, no new labs or imaging studies, please advise, thanks

## 2017-04-20 NOTE — Telephone Encounter (Signed)
Melissa to inquire about status.

## 2017-04-20 NOTE — Telephone Encounter (Signed)
Spoke with the granddaughter, she is wanting the Results of the sleep study a month ago, severe sleep apnea was diagnosis ed from the first part of the study, went for titration (Two weeks ago Wednesday) and they have not heard anything.  She used a  CPAP prior and is now having extreme issues with sleeping.  Granddaughter is also wanting a referral to a Audiologist.  Prior one was in [place] and she is having issues with her hearing aid that she currently using.  Wants to establish with a new provider here.

## 2017-04-21 NOTE — Telephone Encounter (Signed)
Referral sent to [facility] for audiology testing and they will set her up for new hearing aids. The CPAP titration results was in her chart and the order was faxed over to them for the equipment yesterday.

## 2017-04-21 NOTE — Telephone Encounter (Signed)
Please inform patient

## 2017-04-21 NOTE — Telephone Encounter (Signed)
Spoke with the granddaughter and relayed the messages, thanks

## 2017-04-22 ENCOUNTER — Encounter: Payer: Self-pay | Admitting: Family

## 2017-04-22 ENCOUNTER — Ambulatory Visit: Payer: MEDICARE | Attending: Family | Admitting: Family

## 2017-04-22 VITALS — BP 139/78 | HR 67 | Resp 20 | Ht 66.0 in | Wt 189.0 lb

## 2017-04-22 DIAGNOSIS — I5022 Chronic systolic (congestive) heart failure: Secondary | ICD-10-CM

## 2017-04-22 DIAGNOSIS — Z7902 Long term (current) use of antithrombotics/antiplatelets: Secondary | ICD-10-CM | POA: Diagnosis not present

## 2017-04-22 DIAGNOSIS — Z853 Personal history of malignant neoplasm of breast: Secondary | ICD-10-CM | POA: Diagnosis not present

## 2017-04-22 DIAGNOSIS — I481 Persistent atrial fibrillation: Secondary | ICD-10-CM | POA: Insufficient documentation

## 2017-04-22 DIAGNOSIS — Z87442 Personal history of urinary calculi: Secondary | ICD-10-CM | POA: Insufficient documentation

## 2017-04-22 DIAGNOSIS — E039 Hypothyroidism, unspecified: Secondary | ICD-10-CM | POA: Diagnosis not present

## 2017-04-22 DIAGNOSIS — Z955 Presence of coronary angioplasty implant and graft: Secondary | ICD-10-CM | POA: Diagnosis not present

## 2017-04-22 DIAGNOSIS — I890 Lymphedema, not elsewhere classified: Secondary | ICD-10-CM | POA: Insufficient documentation

## 2017-04-22 DIAGNOSIS — I4819 Other persistent atrial fibrillation: Secondary | ICD-10-CM

## 2017-04-22 DIAGNOSIS — I2510 Atherosclerotic heart disease of native coronary artery without angina pectoris: Secondary | ICD-10-CM | POA: Insufficient documentation

## 2017-04-22 DIAGNOSIS — I110 Hypertensive heart disease with heart failure: Secondary | ICD-10-CM | POA: Diagnosis not present

## 2017-04-22 DIAGNOSIS — E785 Hyperlipidemia, unspecified: Secondary | ICD-10-CM | POA: Diagnosis not present

## 2017-04-22 DIAGNOSIS — I5042 Chronic combined systolic (congestive) and diastolic (congestive) heart failure: Secondary | ICD-10-CM | POA: Insufficient documentation

## 2017-04-22 DIAGNOSIS — G4733 Obstructive sleep apnea (adult) (pediatric): Secondary | ICD-10-CM

## 2017-04-22 NOTE — Progress Notes (Signed)
Contreras ID: Stephanie Contreras, female    DOB: Feb 16, 1929, 83 y.o.   MRN: [id number]  HPI  Stephanie Contreras is an 83 y/o female with a history of sleep apnea, breast cancer, atrial fibrillation, HTN, hyperlipidemia, hypothyroidism, kidney stones, lymphedema and chronic heart failure.  Reviewed echo report done on 02/28/17 which showed an EF of 35-40% along with mild MR, moderate TR and moderately elevated PA pressure of 49 mm Hg. Cardiac catheterization done 03/05/17 which showed severe proximal RCA disease with moderate mid- distal RCA disease. DES successfully placed with 0% residual stenosis.  Was in [facility] ED 03/28/17 due to HF exacerbation. Treated and released. Admitted 02/27/17 with pneumonia along with HF exacerbation. Cardiology consult obtained and catheterization done and stent placed. Antibiotics were given. Discharged after 7 days. Was in [facility] ED 02/24/17 due to community acquired pneumonia and was treated and discharged.   Stephanie Contreras presents today for Stephanie Contreras follow-up visit with a chief complaint of mild shortness of breath with moderate exertion. Stephanie Contreras says this has been present for many months with varying levels of severity. Stephanie Contreras has associated fatigue and pedal edema along with this.   Past Medical History:  Diagnosis Date  . Breast cancer ([place])    Right  . Chronic combined systolic and diastolic CHF (congestive heart failure) ([place])   . Coronary artery disease   . Essential hypertension 01/16/2017  . Frequent headaches   . Hyperlipidemia 01/16/2017  . Hypothyroidism 01/16/2017  . Kidney stones   . Lymphedema 01/16/2017  . Persistent atrial fibrillation ([place])   . Sleep apnea    Past Surgical History:  Procedure Laterality Date  . ABDOMINAL HYSTERECTOMY    . BACK SURGERY    . BREAST LUMPECTOMY Right   . CARDIAC CATHETERIZATION    . CHOLECYSTECTOMY    . CORONARY STENT INTERVENTION N/A 03/05/2017   Procedure: Coronary Stent Intervention;  Surgeon: Wellington Hampshire, MD;  Location: [place] [facility];   Service: Cardiovascular;  Laterality: N/A;  . LEFT HEART CATH AND CORONARY ANGIOGRAPHY N/A 03/05/2017   Procedure: Left Heart Cath and Coronary Angiography;  Surgeon: Minna Merritts, MD;  Location: [facility];  Service: Cardiovascular;  Laterality: N/A;  . REPLACEMENT TOTAL KNEE    . TONSILLECTOMY     Family History  Problem Relation Age of Onset  . Breast cancer Sister   . Lung cancer Sister    Social History  Substance Use Topics  . Smoking status: Never Smoker  . Smokeless tobacco: Never Used  . Alcohol use No   No Known Allergies   Prior to Admission medications   Medication Sig Start Date End Date Taking? Authorizing Provider  brimonidine-timolol (COMBIGAN) 0.2-0.5 % ophthalmic solution Place 1 drop into both eyes every 12 (twelve) hours.   Yes [provider]  Calcium Carb-Cholecalciferol (CALTRATE 600+D3 SOFT PO) Take by mouth 3 (three) times daily before meals.   Yes [provider]  carvedilol (COREG) 25 MG tablet Take 1 tablet (25 mg total) by mouth 2 (two) times daily with a meal. 04/07/17  Yes Wellington Hampshire, MD  clopidogrel (PLAVIX) 75 MG tablet Take 1 tablet (75 mg total) by mouth daily with breakfast. 04/07/17  Yes Wellington Hampshire, MD  feeding supplement, ENSURE ENLIVE, (ENSURE ENLIVE) LIQD Take 237 mLs by mouth 2 (two) times daily between meals. 03/06/17  Yes Fritzi Mandes, MD  ferrous sulfate 325 (65 FE) MG EC tablet Take 1 tablet (325 mg total) by mouth 2 (two)  times daily. 04/03/17  Yes Cook, Jayce G, DO  furosemide (LASIX) 40 MG tablet Take 1 tablet (40 mg total) by mouth daily. 04/02/17  Yes Wellington Hampshire, MD  levothyroxine (SYNTHROID, LEVOTHROID) 125 MCG tablet Take 125 mcg by mouth daily.   Yes [provider]  losartan (COZAAR) 25 MG tablet Take 1 tablet (25 mg total) by mouth daily. 04/07/17  Yes Wellington Hampshire, MD  Multiple Vitamins-Minerals (MULTIVITAMIN ADULT PO) Take by mouth. Centrum silver   Yes [provider]  Multiple Vitamins-Minerals (PRESERVISION AREDS PO) Take 1 tablet by mouth 2 (two) times daily.   Yes [provider]  polyvinyl alcohol (LIQUIFILM TEARS) 1.4 % ophthalmic solution Place 2 drops into both eyes as needed for dry eyes.   Yes [provider]  potassium chloride SA (KLOR-CON M20) 20 MEQ tablet Take 1 tablet (20 mEq total) by mouth 2 (two) times daily. 04/07/17 07/06/17 Yes Wellington Hampshire, MD  Sennosides (SENNA-EX PO) Take by mouth daily.   Yes [provider]  simvastatin (ZOCOR) 40 MG tablet Take 40 mg by mouth daily.   Yes [provider]    Review of Systems  Constitutional: Positive for fatigue. Negative for appetite change.  HENT: Positive for hearing loss. Negative for congestion, postnasal drip and sore throat.   Eyes: Negative.   Respiratory: Positive for shortness of breath. Negative for cough and chest tightness.   Cardiovascular: Positive for leg swelling. Negative for chest pain and palpitations.  Gastrointestinal: Negative for abdominal distention and abdominal pain.  Endocrine: Negative.   Genitourinary: Negative.   Musculoskeletal: Negative for back pain and neck pain.  Skin: Negative.   Allergic/Immunologic: Negative.   Neurological: Negative for dizziness and light-headedness.  Hematological: Negative for adenopathy. Does not bruise/bleed easily.  Psychiatric/Behavioral: Positive for sleep disturbance (not sleeping well). Negative for dysphoric mood. Stephanie Contreras is not nervous/anxious.    Vitals:   04/22/17 1459  BP: 139/78  Pulse: 67  Resp: 20  SpO2: 97%  Weight: 189 lb (85.7 kg)  Height: 5\' 6"  (1.676 m)   Wt Readings from Last 3 Encounters:  04/22/17 189 lb (85.7 kg)  04/02/17 187 lb 8 oz (85 kg)  03/31/17 190 lb 8 oz (86.4 kg)    Lab Results  Component Value Date   CREATININE 1.07 (H) 04/17/2017   CREATININE 1.01 (H) 04/09/2017   CREATININE 1.22 (H) 03/31/2017   Physical Exam   Constitutional: Stephanie Contreras is oriented to person, place, and time. Stephanie Contreras appears well-developed and well-nourished.  HENT:  Head: Normocephalic and atraumatic.  Right Ear: Decreased hearing is noted.  Left Ear: Decreased hearing is noted.  Neck: Normal range of motion. Neck supple. No JVD present.  Cardiovascular: Regular rhythm.  Bradycardia present.   Pulmonary/Chest: Effort normal. Stephanie Contreras has no wheezes. Stephanie Contreras has no rales.  Abdominal: Soft. Stephanie Contreras exhibits no distension. There is no tenderness.  Musculoskeletal: Stephanie Contreras exhibits edema (2+ pitting edema in bilateral lower legs with L>R). Stephanie Contreras exhibits no tenderness.  Neurological: Stephanie Contreras is alert and oriented to person, place, and time.  Skin: Skin is warm and dry.  Psychiatric: Stephanie Contreras has a normal mood and affect. Stephanie Contreras behavior is normal. Thought content normal.  Nursing note and vitals reviewed.   Assessment & Plan:  1: Chronic heart failure with reduced ejection fraction- - NYHA class II - euvolemic today (lymphedema present in lower legs) - weighing daily and Stephanie Contreras was reminded to call for an overnight weight gain of >2 pounds or  a weekly weight gain of >5 pounds.  - not adding salt to Stephanie Contreras food. Discussed Stephanie importance of closely following a 2000mg  sodium diet  - supposed to be wearing TED hose but Stephanie Contreras says that they are too tight and hurt Stephanie Contreras legs - trying to elevate Stephanie Contreras legs more often - discussed possibly changing diuretic to torsemide to see if Stephanie Contreras has a better response - saw cardiologist Fletcher Anon) 04/02/17 and returns 05/04/17  2: Atrial fibrillation- - recent diagnosis - currently on apixaban and clopidogrel - discussion about cardioversion in Stephanie future  3: Obstructive sleep apnea- - had sleep study done and is waiting on CPAP mask - saw PCP Lacinda Axon) 03/31/17  Medication list was reviewed.  Return here in 3 months or sooner for any questions/problems before then.

## 2017-04-22 NOTE — Patient Instructions (Signed)
Continue weighing daily and call for an overnight weight gain of > 2 pounds or a weekly weight gain of >5 pounds. 

## 2017-04-23 DIAGNOSIS — I5022 Chronic systolic (congestive) heart failure: Secondary | ICD-10-CM | POA: Insufficient documentation

## 2017-05-03 ENCOUNTER — Telehealth: Payer: Self-pay | Admitting: Student

## 2017-05-03 NOTE — Telephone Encounter (Signed)
Received a call from the patient's grand-daughter that her weight has steadily increased from 186 lbs on 6/27 to 192 lbs today. She notes associated lower extremity edema. No dyspnea, PND, or orthopnea. Reports compliance with low-sodium intake. Has been wearing compression stockings.   She has been on Lasix 40mg  daily. I recommended increasing her Lasix to 40mg  BID (along with making K+ supplementation BID) until her weight approaches baseline of 186 - 187 lbs. If she has not reached her baseline weight by Thursday, I recommended calling our office as she will need a repeat BMET if she is to remain on the increased dosing for an extended duration of time.   She voiced understanding of this and was appreciative of the call.   Signed, Erma Heritage, PA-C 05/03/2017, 11:44 AM Pager: [phone]

## 2017-05-04 ENCOUNTER — Ambulatory Visit: Payer: MEDICARE | Admitting: Cardiovascular Disease

## 2017-05-04 DIAGNOSIS — H6122 Impacted cerumen, left ear: Secondary | ICD-10-CM | POA: Diagnosis not present

## 2017-05-04 DIAGNOSIS — H903 Sensorineural hearing loss, bilateral: Secondary | ICD-10-CM | POA: Diagnosis not present

## 2017-05-07 ENCOUNTER — Telehealth: Payer: Self-pay | Admitting: Cardiovascular Disease

## 2017-05-07 ENCOUNTER — Other Ambulatory Visit
Admission: RE | Admit: 2017-05-07 | Discharge: 2017-05-07 | Disposition: A | Discharge status: Home / Self Care | Payer: MEDICARE | Source: Ambulatory Visit | Attending: Cardiovascular Disease | Admitting: Cardiovascular Disease

## 2017-05-07 DIAGNOSIS — Z79899 Other long term (current) drug therapy: Secondary | ICD-10-CM

## 2017-05-07 DIAGNOSIS — R635 Abnormal weight gain: Secondary | ICD-10-CM

## 2017-05-07 LAB — BASIC METABOLIC PANEL
Anion gap: 8 (ref 5–15)
BUN: 16 mg/dL (ref 6–20)
CO2: 29 mmol/L (ref 22–32)
CREATININE: 0.9 mg/dL (ref 0.44–1.00)
Calcium: 9.5 mg/dL (ref 8.9–10.3)
Chloride: 103 mmol/L (ref 101–111)
GFR calc non Af Amer: 56 mL/min — ABNORMAL LOW (ref >60)
Glucose, Bld: 108 mg/dL — ABNORMAL HIGH (ref 65–99)
Potassium: 3.5 mmol/L (ref 3.5–5.1)
SODIUM: 140 mmol/L (ref 135–145)

## 2017-05-07 NOTE — Telephone Encounter (Signed)
S/w grand-daughter. She will have a family member bring patient up for the lab work this afternoon. Currently, grand-daughter's father is in ICU here at [facility] and is not doing well. They have a lot of family gathering today to see him.

## 2017-05-07 NOTE — Telephone Encounter (Signed)
Pt granddaughter calling stating she spoke to on call over the weekend For weight gain  Was advised to double on lasix and potassium and if by Thursday weight was not close to 186 to call us  Sunday 192 Monday 191 Tuesday 189 Wednesday 192 Today weight is 191  Pt is trying to hit 40 oz of fluid a day  Would like some advise since weight has not gone down   Please call back

## 2017-05-07 NOTE — Telephone Encounter (Signed)
Current weight today was 191 lbs. See previous entry of encounter. Sunday 192 Monday 191 Tuesday 189 Wednesday 192 Today weight is 191

## 2017-05-07 NOTE — Telephone Encounter (Signed)
Agree with bmet. Please find out what her current weight is (baseline of 186-187).

## 2017-05-07 NOTE — Telephone Encounter (Signed)
Returned call to patient's grand-daughter, ok per DPR. Patient's weight has not returned to baseline despite taking furosemide 40 mg BID and potassium 40 mEq BID since Sunday as advised by Bernerd Pho, PA-C on call. Patient is drinking about 40 oz of fluid per day. Patient's lower extremity swelling is at baseline, she routinely wears compression stockings. Shortness of breath is also at baseline as sometimes patient is SOB when she lays down.  Bernerd Pho, PA-C had advised the following: "If she has not reached her baseline weight by Thursday, I recommended calling our office as she will need a repeat BMET if she is to remain on the increased dosing for an extended duration of time."  Advised I will route to our provider for further advice and let her know further recommendations.

## 2017-05-08 ENCOUNTER — Telehealth: Payer: Self-pay | Admitting: *Deleted

## 2017-05-08 DIAGNOSIS — I5022 Chronic systolic (congestive) heart failure: Secondary | ICD-10-CM

## 2017-05-08 NOTE — Telephone Encounter (Signed)
Spoke with patients daughter per release form and reviewed recommendations. She verbalized understanding of all instructions and patient will have repeat labs next week at the [facility] Entr[facility]. She reports that if weight does not improve she will call us back on Monday for further instructions. Lab test ordered and she confirmed location. She verbalized understanding of our conversation, agreement with plan, and had no further questions at this time.

## 2017-05-08 NOTE — Telephone Encounter (Signed)
-----   Message from Rise Mu, PA-C sent at 05/08/2017  7:06 AM EDT ----- Please call the patient. Renal function stable on increased dose of diuretic. Continue increased dose through this weekend with likely return to once daily dosing on 7/16. Recheck bmet 1 week.

## 2017-05-10 ENCOUNTER — Other Ambulatory Visit: Payer: Self-pay | Admitting: Cardiovascular Disease

## 2017-05-18 ENCOUNTER — Other Ambulatory Visit
Admission: RE | Admit: 2017-05-18 | Discharge: 2017-05-18 | Disposition: A | Discharge status: Home / Self Care | Payer: MEDICARE | Source: Ambulatory Visit | Attending: Physician Assistant | Admitting: Physician Assistant

## 2017-05-18 ENCOUNTER — Telehealth: Payer: Self-pay | Admitting: Cardiovascular Disease

## 2017-05-18 DIAGNOSIS — I5022 Chronic systolic (congestive) heart failure: Secondary | ICD-10-CM | POA: Diagnosis not present

## 2017-05-18 LAB — BASIC METABOLIC PANEL
ANION GAP: 7 (ref 5–15)
BUN: 20 mg/dL (ref 6–20)
CALCIUM: 9.8 mg/dL (ref 8.9–10.3)
CO2: 27 mmol/L (ref 22–32)
Chloride: 108 mmol/L (ref 101–111)
Creatinine, Ser: 1.15 mg/dL — ABNORMAL HIGH (ref 0.44–1.00)
GFR calc Af Amer: 48 mL/min — ABNORMAL LOW (ref >60)
GFR calc non Af Amer: 42 mL/min — ABNORMAL LOW (ref >60)
GLUCOSE: 100 mg/dL — AB (ref 65–99)
Potassium: 3.3 mmol/L — ABNORMAL LOW (ref 3.5–5.1)
Sodium: 142 mmol/L (ref 135–145)

## 2017-05-18 NOTE — Telephone Encounter (Addendum)
S/w [name], pt's granddaughter who reports pt's weight 192lbs today. 7/15    186 lbs 7/16  186 lbs 7/17 No reported weight 7/18 187 lbs 7/19 189 lbs 7/20-7/22 No reported weight  She takes lasix 40mg  daily and consumes 40 ounces of fluid day.Unsure if pt is following low sodium diet as she recently moved back with her daughter who provides meals. Wears compression stockings.  Legs are tight with blisters. Denies any increased dyspnea.  Pt travelled 7 hours by car to [place] 7/20 and returned to [facility] 7/22 to attend her son's funeral.  Granddaughter reports pt had trouble ambulating after the long ride.  Difficult to control dietary restrictions this weekend. .   Lasix was increased to 40mg  BID July 8 for weight gain. She decreased lasix back to 40mg  qd July 16. Pt scheduled for BMET today at the [facility]. Will await lab results and route to Dr. Fletcher Anon for possible lasix adjustment.

## 2017-05-18 NOTE — Telephone Encounter (Signed)
Pt grandaughter states pt legs are swollen and "rubbery". States pt is going this a.m. to get her blood work done. She is not sure of her weight, but will call back with that.

## 2017-05-19 NOTE — Telephone Encounter (Signed)
Per Dr. Fletcher Anon, unable to increase lasix due to mildly elevated kidney function. Pt will need to follow strict low sodium diet, limit fluid intake, elevate legs and wear compression stockings.  Reviewed w/granddaughter who verbalized understanding Pt's weight is still  192lbs this morning.  Granddaughter [name] reports pt does not elevate legs but instead sits most of the day on a bar stool, dangling legs. Stressed importance of elevating legs. [name] is agreeable and will help pt with compliance however, pt is now living with her daughter.

## 2017-05-20 ENCOUNTER — Telehealth: Payer: Self-pay

## 2017-05-20 NOTE — Telephone Encounter (Signed)
Spoke with Jene Every  granddaughter states patient is not able tolerate CPAP setting on highest on 20 will not sleep with it on now . [facility] told her she could turn it on auto setting but need verbal order.  They stated they faxed form over for Dr Lacinda Axon to sign .  I will call [place] and speak  Sabino Gasser [phone] .  Spoke with Melissa and they can take verbal order.  Need verbal to change pressure auto 5-20.   Thanks

## 2017-05-21 NOTE — Telephone Encounter (Signed)
Verbal is fine

## 2017-05-21 NOTE — Telephone Encounter (Signed)
Spoke with Melissa at [facility] and confirmed they received order.

## 2017-05-21 NOTE — Telephone Encounter (Signed)
Called and left verbal order as stated below at [place] .  Advised them to call me back confirming they received order.  Advised [name] granddaughter verbal order given.

## 2017-05-25 ENCOUNTER — Other Ambulatory Visit: Payer: Self-pay

## 2017-05-25 ENCOUNTER — Telehealth: Payer: Self-pay | Admitting: Cardiovascular Disease

## 2017-05-25 MED ORDER — CLOPIDOGREL BISULFATE 75 MG PO TABS: 75.0000 mg | ORAL_TABLET | Freq: Every day | ORAL | 0 refills | Status: DC

## 2017-05-25 MED ORDER — LOSARTAN POTASSIUM 25 MG PO TABS: 25.0000 mg | ORAL_TABLET | Freq: Every day | ORAL | 0 refills | Status: DC

## 2017-05-25 MED ORDER — POTASSIUM CHLORIDE CRYS ER 20 MEQ PO TBCR: 20.0000 meq | EXTENDED_RELEASE_TABLET | Freq: Two times a day (BID) | ORAL | 2 refills | Status: DC

## 2017-05-25 NOTE — Telephone Encounter (Signed)
°*  STAT* If patient is at the pharmacy, call can be transferred to refill team.   1. Which medications need to be refilled? (please list name of each medication and dose if known)  Losartan  Plavix Potassium   2. Which pharmacy/location (including street and city if local pharmacy) is [facility] sent [facility]   3. Do they need a 30 day or 90 day supply? 90 day

## 2017-05-25 NOTE — Telephone Encounter (Signed)
Requested Prescriptions   Signed Prescriptions Disp Refills  . potassium chloride SA (K-DUR,KLOR-CON) 20 MEQ tablet 60 tablet 2    Sig: Take 1 tablet (20 mEq total) by mouth 2 (two) times daily.    Authorizing Provider: Kathlyn Sacramento A    Ordering User: Janan Ridge clopidogrel (PLAVIX) 75 MG tablet 90 tablet 0    Sig: Take 1 tablet (75 mg total) by mouth daily with breakfast.    Authorizing Provider: Kathlyn Sacramento A    Ordering User: Janan Ridge losartan (COZAAR) 25 MG tablet 90 tablet 0    Sig: Take 1 tablet (25 mg total) by mouth daily.    Authorizing Provider: Kathlyn Sacramento A    Ordering User: Janan Ridge

## 2017-05-27 NOTE — Progress Notes (Signed)
Cardiology Office Note Date:  05/28/2017  Patient ID:  Stephanie Contreras, DOB 30-May-1929, MRN [id number] PCP:  Coral Spikes, DO  Cardiologist:  Dr. Fletcher Anon, MD    Chief Complaint: Follow up Afib/CHF  History of Present Illness: Stephanie Contreras is a 83 y.o. female with history of CAD s/p NSTEMI in early 02/2017 with peak troponin of 0.06 s/p PCI/DES to the RCA, chronic combined systolic and diastolic CHF/ICM, Afib diagnosed in 12/2016 on Eliquis, anemia, moderate aortic stenosis, venous insufficiency, breast cancer, hypothyroidism, HTN, HLD, and OSA intolerant to CPAP who presents for follow up of Afib and CHF.   She was seen in the ED on 02/24/17 and diagnosed with CAP. At that time troponin negative x 1. Unremarkable CBC. CXR c/w mulifocal PNA with small left-sided pleural effusion. EKG with Afib with RVR, 101 bpm, nonspecific st/t changes. Outpatient follow up advised.   Patient established care with Dr. Yvone Neu on 02/26/17 for evaluation of her newly found Afib as above. At that time she was asymptomatic outside of her PNA. She presented to [facility] on 5/4 with increased SOB with chest pressure, found to have HCAP with acute systolic CHF and Afib with RVR. TTE performed on 02/28/17 showed EF 35-40%, diffuse hypokinesis, study not techincally sufficient to allow for LV diastolic function, moderate aortic stenosis, mild mitral regurgitation, left atrium dilated at 37 mm, right atrium mildly dilated, moderate tricuspid regurgitation, PASP 49 mmHg. Troponin peaked at 0.06, BNP 1190. Blood cultures negative x 2. Heart rate was controlled with Coreg. Given her elevated troponin in the setting of HCAP and acute combined CHF she underwent Lexiscan Myoview on 5/8 that was high risk with a moderate in size, moderate in severity, partially reversible defect involving the inferolateral myocardium c/w scar and periinfarct ischemia. There was also a small in size, moderate in severity, fixed apical defect most likely representing  apical thinning and/or artifact and less likely scar. EF < 30%. Because of this, she underwent LHC on 5/10 that showed left main without significant disease, proximal D2 80% (small vessel), proximal to mid RCA 90% stenosed and mid to distal RCA 60% stenosed. She underwent successful PCI/DES to the proximal to mid RCA with 0% residual stenosis. Given she has Afib and needed long term, full-dose anticoagulation it was recommended, she be treated with Plavix and Eliquis without aspirin to minimize the risk of bleeding. She was seen in the ED in early June for SOB with mild volume overload in the setting of holding Lasix 2/2 worsening renal function. In follow up with Dr. Fletcher Anon on 04/02/17, her symptoms were improved and she was continued on Lasix 40 mg daily as she was still felt to be mildly volume overloaded. He anemia was also noted to be progressive with a drop in hgb of 4 grams in 2 months. Her Eliquis was held until she had a stable hgb noted to be > 10. She was most recently seen by the [facility] on 6/27, no changes were made.  Patient comes in today stating she is feeling well. She does note shortness of breath with exertional activities around the house including getting dressed. No chest pain. Weight log at home has demonstrated a slow progression of weight gain dating back to mid July. Weights at that time were in the low 180s and have slowly trended up to the mid 190s via her home scale. She has continued to take Lasix 40 mg in the morning with potassium repletion twice a day  throughout this time. She has not missed any doses of her Lasix. Blood pressure at home has remained well controlled. As of 8/1, patient again to elevate her legs when sitting, prior to that, she continued to let her legs hanging from a sitting position. She continues to sleep in a recliner and has done so for many years. She denies any early satiety, PND, abdominal distention, or cough. She denies any melena, BRBPR,  hematemesis, hemoptysis, or hematuria.   Past Medical History:  Diagnosis Date  . Breast cancer ([place])    Right  . Chronic combined systolic and diastolic CHF (congestive heart failure) ([place])   . Coronary artery disease   . Essential hypertension 01/16/2017  . Frequent headaches   . Hyperlipidemia 01/16/2017  . Hypothyroidism 01/16/2017  . Kidney stones   . Lymphedema 01/16/2017  . Persistent atrial fibrillation ([place])   . Sleep apnea     Past Surgical History:  Procedure Laterality Date  . ABDOMINAL HYSTERECTOMY    . BACK SURGERY    . BREAST LUMPECTOMY Right   . CARDIAC CATHETERIZATION    . CHOLECYSTECTOMY    . CORONARY STENT INTERVENTION N/A 03/05/2017   Procedure: Coronary Stent Intervention;  Surgeon: Wellington Hampshire, MD;  Location: [facility];  Service: Cardiovascular;  Laterality: N/A;  . LEFT HEART CATH AND CORONARY ANGIOGRAPHY N/A 03/05/2017   Procedure: Left Heart Cath and Coronary Angiography;  Surgeon: Minna Merritts, MD;  Location: [facility];  Service: Cardiovascular;  Laterality: N/A;  . REPLACEMENT TOTAL KNEE    . TONSILLECTOMY      Current Meds  Medication Sig  . brimonidine-timolol (COMBIGAN) 0.2-0.5 % ophthalmic solution Place 1 drop into both eyes every 12 (twelve) hours.  . Calcium Carb-Cholecalciferol (CALTRATE 600+D3 SOFT PO) Take by mouth 3 (three) times daily before meals.  . carvedilol (COREG) 25 MG tablet Take 1 tablet (25 mg total) by mouth 2 (two) times daily with a meal.  . clopidogrel (PLAVIX) 75 MG tablet Take 1 tablet (75 mg total) by mouth daily with breakfast.  . feeding supplement, ENSURE ENLIVE, (ENSURE ENLIVE) LIQD Take 237 mLs by mouth 2 (two) times daily between meals.  . ferrous sulfate 325 (65 FE) MG EC tablet Take 1 tablet (325 mg total) by mouth 2 (two) times daily.  . furosemide (LASIX) 40 MG tablet Take 1 tablet (40 mg total) by mouth daily.  Marland Kitchen levothyroxine (SYNTHROID, LEVOTHROID) 125 MCG tablet Take 125 mcg by  mouth daily.  Marland Kitchen losartan (COZAAR) 25 MG tablet Take 1 tablet (25 mg total) by mouth daily.  . Multiple Vitamins-Minerals (MULTIVITAMIN ADULT PO) Take by mouth. Centrum silver  . Multiple Vitamins-Minerals (PRESERVISION AREDS PO) Take 1 tablet by mouth 2 (two) times daily.  . polyvinyl alcohol (LIQUIFILM TEARS) 1.4 % ophthalmic solution Place 2 drops into both eyes as needed for dry eyes.  . potassium chloride SA (K-DUR,KLOR-CON) 20 MEQ tablet Take 1 tablet (20 mEq total) by mouth 2 (two) times daily.  . Sennosides (SENNA-EX PO) Take by mouth daily.  . simvastatin (ZOCOR) 40 MG tablet Take 40 mg by mouth daily.    Allergies:   Patient has no known allergies.   Social History:  The patient  reports that she has never smoked. She has never used smokeless tobacco. She reports that she does not drink alcohol or use drugs.   Family History:  The patient's family history includes Breast cancer in her sister; Lung cancer in her sister.  ROS:   Review of Systems  Constitutional: Positive for malaise/fatigue. Negative for chills, diaphoresis, fever and weight loss.  HENT: Negative for congestion.   Eyes: Negative for discharge and redness.  Respiratory: Positive for shortness of breath. Negative for cough, hemoptysis, sputum production and wheezing.   Cardiovascular: Positive for orthopnea and leg swelling. Negative for chest pain, palpitations, claudication and PND.  Gastrointestinal: Negative for abdominal pain, blood in stool, heartburn, melena, nausea and vomiting.  Genitourinary: Negative for hematuria.  Musculoskeletal: Negative for falls and myalgias.  Skin: Negative for rash.  Neurological: Positive for weakness. Negative for dizziness, tingling, tremors, sensory change, speech change, focal weakness and loss of consciousness.  Endo/Heme/Allergies: Does not bruise/bleed easily.  Psychiatric/Behavioral: Negative for substance abuse. The patient is not nervous/anxious.   All other systems  reviewed and are negative.    PHYSICAL EXAM:  VS:  BP 130/70 (BP Location: Left Arm, Patient Position: Sitting, Cuff Size: Normal)   Pulse 81   Ht 5\' 6"  (1.676 m)   Wt 198 lb 8 oz (90 kg)   BMI 32.04 kg/m  BMI: Body mass index is 32.04 kg/m.  Physical Exam  Constitutional: She is oriented to person, place, and time. She appears well-developed and well-nourished.  HENT:  Head: Normocephalic and atraumatic.  Eyes: Right eye exhibits no discharge. Left eye exhibits no discharge.  Neck: Normal range of motion. No JVD present.  Cardiovascular: Normal rate, S1 normal and S2 normal.  An irregularly irregular rhythm present. Exam reveals no distant heart sounds, no friction rub, no midsystolic click and no opening snap.   Murmur heard.  Harsh midsystolic murmur is present with a grade of 2/6  at the upper right sternal border radiating to the neck Pulmonary/Chest: Effort normal and breath sounds normal. No respiratory distress. She has no decreased breath sounds. She has no wheezes. She has no rales. She exhibits no tenderness.  Abdominal: Soft. She exhibits no distension. There is no tenderness.  Musculoskeletal: She exhibits edema.  2+ pitting edema to the bilateral thighs.  Neurological: She is alert and oriented to person, place, and time.  Skin: Skin is warm and dry. No cyanosis. Nails show no clubbing.  Psychiatric: She has a normal mood and affect. Her speech is normal and behavior is normal. Judgment and thought content normal.     EKG:  Was ordered and interpreted by me today. Shows Afib, 81 bpm, nonspecific inferolateral st/t changes   Recent Labs: 01/16/2017: TSH 2.66 03/11/2017: Magnesium 1.8 03/28/2017: B Natriuretic Peptide 1,557.0 03/31/2017: ALT 11 04/09/2017: Hemoglobin 9.7; Platelets 245 05/18/2017: BUN 20; Creatinine, Ser 1.15; Potassium 3.3; Sodium 142  01/16/2017: Cholesterol 136; HDL 37.60; LDL Cholesterol 73; Total CHOL/HDL Ratio 4; Triglycerides 129.0; VLDL 25.8    Estimated Creatinine Clearance: 39.7 mL/min (A) (by C-G formula based on SCr of 1.15 mg/dL (H)).   Wt Readings from Last 3 Encounters:  05/28/17 198 lb 8 oz (90 kg)  04/22/17 189 lb (85.7 kg)  04/02/17 187 lb 8 oz (85 kg)     Other studies reviewed: Additional studies/records reviewed today include: summarized above  ASSESSMENT AND PLAN:  1. Acute on chronic systolic CHF/venous insufficiency: Weight continues to trend upwards. She is at 11 pounds up from her most recent visit here on 6/7. Some of this may be multifactorial including acute on chronic systolic CHF, though cannot rule out some degree of third spacing in the setting of her continuing down trend of her hemoglobin. For now, we will increase her  Lasix to 40 mg twice a day along with potassium repletion 20 mEq twice a day through 05/31/17. On 8/6, patient will decrease Lasix to 40 mg daily along with KCl 20 mEq daily. We will check a bmet and CBC today to evaluate for stable renal function and to trend her hemoglobin. We will have a recheck bmet on 8/6. CHF education provided. Continue daily weights. Call for weight gain greater than 3 pounds overnight or 5 pounds in a 7 day time span. Continue to fluid restrict. Continue to elevate legs when sitting. Continue compression stockings.  2. CAD as above: No symptoms concerning for angina at this time. Continue Plavix alone. She is on single antiplatelet therapy at this time given her continued progression of anemia. Interventional cardiology has wanted to avoid triple therapy given her anemia. Continue carvedilol when he 5 mg twice a day and simvastatin. No plans for further ischemic evaluation at this time.  3. Persistent Afib: She remains in A. fib at this time with controlled ventricular rate. No longer on long-term full dose anticoagulation given progression of anemia. She is aware of his stroke risk. Continue carvedilol as above for rate control. CHADS2VASc at least 6 (CHF, HTN, age x 2,  vascular disease, sex category). No plans for rhythm restoration at this time given inability to fully anticoagulate.  4. Anemia: Check cbc. Needs follow up with PCP to evaluate for blood loss. Continue to hold Eliquis until hgb is noted to be stable and greater than 10.   5. AKI: Check bmet.   Disposition: F/u with myself or Dr. Fletcher Anon, MD in 1 month.  Current medicines are reviewed at length with the patient today.  The patient did not have any concerns regarding medicines.  Melvern Banker PA-C 05/28/2017 1:38 PM     [address] [place], [place] [address] [phone]

## 2017-05-28 ENCOUNTER — Encounter: Payer: Self-pay | Admitting: Physician Assistant

## 2017-05-28 ENCOUNTER — Ambulatory Visit (INDEPENDENT_AMBULATORY_CARE_PROVIDER_SITE_OTHER): Payer: MEDICARE | Admitting: Physician Assistant

## 2017-05-28 VITALS — BP 130/70 | HR 81 | Ht 66.0 in | Wt 198.5 lb

## 2017-05-28 DIAGNOSIS — I10 Essential (primary) hypertension: Secondary | ICD-10-CM | POA: Diagnosis not present

## 2017-05-28 DIAGNOSIS — N179 Acute kidney failure, unspecified: Secondary | ICD-10-CM | POA: Diagnosis not present

## 2017-05-28 DIAGNOSIS — D649 Anemia, unspecified: Secondary | ICD-10-CM

## 2017-05-28 DIAGNOSIS — I481 Persistent atrial fibrillation: Secondary | ICD-10-CM

## 2017-05-28 DIAGNOSIS — I5022 Chronic systolic (congestive) heart failure: Secondary | ICD-10-CM

## 2017-05-28 DIAGNOSIS — I5023 Acute on chronic systolic (congestive) heart failure: Secondary | ICD-10-CM | POA: Diagnosis not present

## 2017-05-28 DIAGNOSIS — Z79899 Other long term (current) drug therapy: Secondary | ICD-10-CM | POA: Diagnosis not present

## 2017-05-28 DIAGNOSIS — I4819 Other persistent atrial fibrillation: Secondary | ICD-10-CM

## 2017-05-28 DIAGNOSIS — I2510 Atherosclerotic heart disease of native coronary artery without angina pectoris: Secondary | ICD-10-CM

## 2017-05-28 MED ORDER — POTASSIUM CHLORIDE CRYS ER 20 MEQ PO TBCR: 20.0000 meq | EXTENDED_RELEASE_TABLET | Freq: Every day | ORAL | 2 refills | Status: DC

## 2017-05-28 NOTE — Patient Instructions (Addendum)
Medication Instructions:  Your physician has recommended you make the following change in your medication:  1- TAKE Furosemide 40 mg by mouth twice a day through Sunday, THEN go back to taking furosemide 40 mg by mouth once a day.  2- TAKE Potassium 20 mEq by mouth twice a day through Sunday, THEN take Potassium 20 mEq by mouth once a day.   Labwork: Your physician recommends that you return for lab work in: TODAY (CBC, BMP).  Your physician recommends that you return for lab work ON Monday, June 01, 2017. - Please go to the [facility]. You will check in at the front desk to the right as you walk into the atrium. Valet Parking is offered if needed.    Testing/Procedures: none  Follow-Up: Your physician recommends that you schedule a follow-up appointment in: [place].    If you need a refill on your cardiac medications before your next appointment, please call your pharmacy.

## 2017-05-29 ENCOUNTER — Telehealth: Payer: Self-pay | Admitting: Family Medicine

## 2017-05-29 ENCOUNTER — Other Ambulatory Visit: Payer: Self-pay

## 2017-05-29 DIAGNOSIS — D649 Anemia, unspecified: Secondary | ICD-10-CM

## 2017-05-29 LAB — CBC WITH DIFFERENTIAL/PLATELET
BASOS ABS: 0 10*3/uL (ref 0.0–0.2)
Basos: 0 %
EOS (ABSOLUTE): 0.2 10*3/uL (ref 0.0–0.4)
Eos: 3 %
HEMATOCRIT: 35.2 % (ref 34.0–46.6)
Hemoglobin: 10.5 g/dL — ABNORMAL LOW (ref 11.1–15.9)
Immature Grans (Abs): 0 10*3/uL (ref 0.0–0.1)
Immature Granulocytes: 0 %
LYMPHS ABS: 1.1 10*3/uL (ref 0.7–3.1)
Lymphs: 18 %
MCH: 26.3 pg — ABNORMAL LOW (ref 26.6–33.0)
MCHC: 29.8 g/dL — AB (ref 31.5–35.7)
MCV: 88 fL (ref 79–97)
MONOS ABS: 0.7 10*3/uL (ref 0.1–0.9)
Monocytes: 12 %
Neutrophils Absolute: 4.1 10*3/uL (ref 1.4–7.0)
Neutrophils: 67 %
PLATELETS: 242 10*3/uL (ref 150–379)
RBC: 3.99 x10E6/uL (ref 3.77–5.28)
RDW: 16.7 % — ABNORMAL HIGH (ref 12.3–15.4)
WBC: 6.1 10*3/uL (ref 3.4–10.8)

## 2017-05-29 LAB — BASIC METABOLIC PANEL
BUN / CREAT RATIO: 14 (ref 12–28)
BUN: 14 mg/dL (ref 8–27)
CHLORIDE: 106 mmol/L (ref 96–106)
CO2: 26 mmol/L (ref 20–29)
Calcium: 9.8 mg/dL (ref 8.7–10.3)
Creatinine, Ser: 1.03 mg/dL — ABNORMAL HIGH (ref 0.57–1.00)
GFR calc Af Amer: 57 mL/min/{1.73_m2} — ABNORMAL LOW (ref >59)
GFR calc non Af Amer: 49 mL/min/{1.73_m2} — ABNORMAL LOW (ref >59)
GLUCOSE: 91 mg/dL (ref 65–99)
POTASSIUM: 4.1 mmol/L (ref 3.5–5.2)
SODIUM: 144 mmol/L (ref 134–144)

## 2017-05-29 NOTE — Telephone Encounter (Signed)
Hemoglobin is improving. Continue iron therapy.

## 2017-05-29 NOTE — Telephone Encounter (Signed)
[name] granddaughter advised of below and verbalized understanding.

## 2017-05-29 NOTE — Telephone Encounter (Signed)
Pt granddaughter called and stated that pt went to the cardiologist yesterday and they are concerned about her legs swelling so much, they are starting her on lasix again, they are also concerned with her hemoglobin being so low. They would like you to review the office notes from yesterday. Do you want to have pt continue the same dosage of iron that she has been taking. Please advise, thank you!  Call [facility] @ [phone] (will not be available until after 2 pm)

## 2017-06-01 ENCOUNTER — Other Ambulatory Visit
Admission: RE | Admit: 2017-06-01 | Discharge: 2017-06-01 | Disposition: A | Discharge status: Home / Self Care | Payer: MEDICARE | Source: Ambulatory Visit | Attending: Physician Assistant | Admitting: Physician Assistant

## 2017-06-01 ENCOUNTER — Other Ambulatory Visit: Payer: Self-pay

## 2017-06-01 DIAGNOSIS — D649 Anemia, unspecified: Secondary | ICD-10-CM | POA: Insufficient documentation

## 2017-06-01 DIAGNOSIS — I481 Persistent atrial fibrillation: Secondary | ICD-10-CM | POA: Insufficient documentation

## 2017-06-01 DIAGNOSIS — I10 Essential (primary) hypertension: Secondary | ICD-10-CM | POA: Diagnosis not present

## 2017-06-01 DIAGNOSIS — I5023 Acute on chronic systolic (congestive) heart failure: Secondary | ICD-10-CM | POA: Insufficient documentation

## 2017-06-01 DIAGNOSIS — I5022 Chronic systolic (congestive) heart failure: Secondary | ICD-10-CM

## 2017-06-01 DIAGNOSIS — I4819 Other persistent atrial fibrillation: Secondary | ICD-10-CM

## 2017-06-01 DIAGNOSIS — Z79899 Other long term (current) drug therapy: Secondary | ICD-10-CM | POA: Diagnosis not present

## 2017-06-01 DIAGNOSIS — I2510 Atherosclerotic heart disease of native coronary artery without angina pectoris: Secondary | ICD-10-CM | POA: Insufficient documentation

## 2017-06-01 LAB — CBC WITH DIFFERENTIAL/PLATELET
BASOS ABS: 0 10*3/uL (ref 0–0.1)
BASOS PCT: 1 %
EOS ABS: 0.1 10*3/uL (ref 0–0.7)
Eosinophils Relative: 3 %
HCT: 33.4 % — ABNORMAL LOW (ref 35.0–47.0)
Hemoglobin: 10.8 g/dL — ABNORMAL LOW (ref 12.0–16.0)
LYMPHS ABS: 0.8 10*3/uL — AB (ref 1.0–3.6)
Lymphocytes Relative: 16 %
MCH: 27.4 pg (ref 26.0–34.0)
MCHC: 32.2 g/dL (ref 32.0–36.0)
MCV: 85 fL (ref 80.0–100.0)
MONOS PCT: 11 %
Monocytes Absolute: 0.5 10*3/uL (ref 0.2–0.9)
NEUTROS ABS: 3.5 10*3/uL (ref 1.4–6.5)
NEUTROS PCT: 69 %
PLATELETS: 193 10*3/uL (ref 150–440)
RBC: 3.92 MIL/uL (ref 3.80–5.20)
RDW: 18.4 % — AB (ref 11.5–14.5)
WBC: 5 10*3/uL (ref 3.6–11.0)

## 2017-06-01 LAB — BASIC METABOLIC PANEL
ANION GAP: 8 (ref 5–15)
BUN: 15 mg/dL (ref 6–20)
CALCIUM: 9.5 mg/dL (ref 8.9–10.3)
CO2: 28 mmol/L (ref 22–32)
Chloride: 105 mmol/L (ref 101–111)
Creatinine, Ser: 1.11 mg/dL — ABNORMAL HIGH (ref 0.44–1.00)
GFR, EST AFRICAN AMERICAN: 51 mL/min — AB (ref >60)
GFR, EST NON AFRICAN AMERICAN: 44 mL/min — AB (ref >60)
GLUCOSE: 111 mg/dL — AB (ref 65–99)
Potassium: 3.1 mmol/L — ABNORMAL LOW (ref 3.5–5.1)
Sodium: 141 mmol/L (ref 135–145)

## 2017-06-01 MED ORDER — TORSEMIDE 20 MG PO TABS: 40.0000 mg | ORAL_TABLET | Freq: Every day | ORAL | 3 refills | Status: DC

## 2017-06-01 MED ORDER — POTASSIUM CHLORIDE CRYS ER 20 MEQ PO TBCR: 40.0000 meq | EXTENDED_RELEASE_TABLET | Freq: Every day | ORAL | 2 refills | Status: DC

## 2017-06-01 NOTE — Progress Notes (Unsigned)
bmet  

## 2017-06-02 ENCOUNTER — Other Ambulatory Visit: Payer: Self-pay

## 2017-06-02 DIAGNOSIS — I482 Chronic atrial fibrillation, unspecified: Secondary | ICD-10-CM

## 2017-06-02 MED ORDER — APIXABAN 5 MG PO TABS: 5.0000 mg | ORAL_TABLET | Freq: Two times a day (BID) | ORAL | 2 refills | Status: DC

## 2017-06-04 ENCOUNTER — Telehealth: Payer: Self-pay | Admitting: Cardiovascular Disease

## 2017-06-04 NOTE — Telephone Encounter (Signed)
Per Pharmacy patient has been taking eliquis for a prolonged period of time and they would like to clarify that she should still continue to take.  They would also like to document a weight and serum creatinine.  Please call .

## 2017-06-04 NOTE — Telephone Encounter (Signed)
S/w Becky, Pharmacist at [facility], who inquires of pt's weight and serum creatinine for Eliquis.  Provided information to pharmacist who will fill Eliquis prescription now

## 2017-06-09 ENCOUNTER — Other Ambulatory Visit
Admission: RE | Admit: 2017-06-09 | Discharge: 2017-06-09 | Disposition: A | Discharge status: Home / Self Care | Payer: MEDICARE | Source: Ambulatory Visit | Attending: Cardiovascular Disease | Admitting: Cardiovascular Disease

## 2017-06-09 DIAGNOSIS — I5022 Chronic systolic (congestive) heart failure: Secondary | ICD-10-CM | POA: Diagnosis not present

## 2017-06-09 DIAGNOSIS — I482 Chronic atrial fibrillation, unspecified: Secondary | ICD-10-CM

## 2017-06-09 LAB — CBC
HEMATOCRIT: 33.6 % — AB (ref 35.0–47.0)
HEMOGLOBIN: 10.8 g/dL — AB (ref 12.0–16.0)
MCH: 27 pg (ref 26.0–34.0)
MCHC: 32.1 g/dL (ref 32.0–36.0)
MCV: 84 fL (ref 80.0–100.0)
Platelets: 191 10*3/uL (ref 150–440)
RBC: 4 MIL/uL (ref 3.80–5.20)
RDW: 18.9 % — ABNORMAL HIGH (ref 11.5–14.5)
WBC: 5 10*3/uL (ref 3.6–11.0)

## 2017-06-09 LAB — BASIC METABOLIC PANEL
Anion gap: 8 (ref 5–15)
BUN: 17 mg/dL (ref 6–20)
CALCIUM: 9.6 mg/dL (ref 8.9–10.3)
CO2: 30 mmol/L (ref 22–32)
Chloride: 104 mmol/L (ref 101–111)
Creatinine, Ser: 1.2 mg/dL — ABNORMAL HIGH (ref 0.44–1.00)
GFR calc Af Amer: 46 mL/min — ABNORMAL LOW (ref >60)
GFR, EST NON AFRICAN AMERICAN: 40 mL/min — AB (ref >60)
Glucose, Bld: 132 mg/dL — ABNORMAL HIGH (ref 65–99)
POTASSIUM: 2.8 mmol/L — AB (ref 3.5–5.1)
SODIUM: 142 mmol/L (ref 135–145)

## 2017-06-10 ENCOUNTER — Telehealth: Payer: Self-pay | Admitting: *Deleted

## 2017-06-10 DIAGNOSIS — I4891 Unspecified atrial fibrillation: Secondary | ICD-10-CM

## 2017-06-10 DIAGNOSIS — I5022 Chronic systolic (congestive) heart failure: Secondary | ICD-10-CM

## 2017-06-10 MED ORDER — POTASSIUM CHLORIDE CRYS ER 20 MEQ PO TBCR: 40.0000 meq | EXTENDED_RELEASE_TABLET | Freq: Two times a day (BID) | ORAL | 3 refills | Status: DC

## 2017-06-10 NOTE — Telephone Encounter (Signed)
-----   Message from Rogelia Mire, NP sent at 06/09/2017  7:24 PM EDT ----- K low @ 2.8.  Kidney function shows that she's a little bit dryer.  If wt stable cont current diuretics but she should take kdur 40 3 x on 8/15 and then BID.  F/u bmet in a week.

## 2017-06-10 NOTE — Telephone Encounter (Signed)
Spoke with patients granddaugther at length. Reviewed results and recommendations with her and she read back all instructions for medication changes and repeat lab testing needed. Instructed her to have her grandmother take 40 meq potassium three times today and then starting tomorrow she should take the 40 meq potassium twice daily. Also advised her that she would need repeat labs next Thursday or Friday over at the [facility] Entrance of the hospital. Lab request entered and prescription sent in to [facility] per her request. She read back all instructions in detail. She did state that the patients living situation will change soon and they will be better able to manage her care. She did state that her mother was down to have labs rechecked in 2 weeks so she wanted to know if she still needed to have those done. Advised her that it really would depend on the results from next week and that we would let her know at that time if it is still needed. She verbalized understanding of all instructions, agreeable with plan, and had no further questions at this time.

## 2017-06-15 ENCOUNTER — Telehealth: Payer: Self-pay | Admitting: Cardiovascular Disease

## 2017-06-15 NOTE — Telephone Encounter (Signed)
S/w granddaughter, Leveda Anna, who reports pt has been having stinging, itching and burning bilateral LE. No redness or tenderness. Legs look like they normally do. Since her initial call this morning, granddaughter found out that pt has not been wearing compression stockings as instructed and reports sx resolve when she is wearing them. Leveda Anna will help pt with compression stockings as directed, monitor sx and call if sx persist. She is appreciative of the return call and states she does not feel sx are related to eliquis.

## 2017-06-15 NOTE — Telephone Encounter (Signed)
° ° °  Pt c/o medication issue:  1. Name of Medication: ? Eliquis   2. How are you currently taking this medication (dosage and times per day)? 5 mg po BID 3. Are you having a reaction (difficulty breathing--STAT)? Patient recently started taking new blood thinner and is having stinging itching and burning in BLE   4. What is your medication issue?  Concerned that this is a reaction to restarting medication  Please call to granddaughter to discuss before 2 as she has to go to work

## 2017-06-18 ENCOUNTER — Other Ambulatory Visit
Admission: RE | Admit: 2017-06-18 | Discharge: 2017-06-18 | Disposition: A | Discharge status: Home / Self Care | Payer: MEDICARE | Source: Ambulatory Visit | Attending: Nurse Practitioner | Admitting: Nurse Practitioner

## 2017-06-18 DIAGNOSIS — I4891 Unspecified atrial fibrillation: Secondary | ICD-10-CM | POA: Diagnosis not present

## 2017-06-18 DIAGNOSIS — I5022 Chronic systolic (congestive) heart failure: Secondary | ICD-10-CM | POA: Insufficient documentation

## 2017-06-18 LAB — BASIC METABOLIC PANEL
Anion gap: 9 (ref 5–15)
BUN: 20 mg/dL (ref 6–20)
CO2: 28 mmol/L (ref 22–32)
CREATININE: 1.17 mg/dL — AB (ref 0.44–1.00)
Calcium: 10 mg/dL (ref 8.9–10.3)
Chloride: 104 mmol/L (ref 101–111)
GFR calc Af Amer: 47 mL/min — ABNORMAL LOW (ref >60)
GFR, EST NON AFRICAN AMERICAN: 41 mL/min — AB (ref >60)
GLUCOSE: 88 mg/dL (ref 65–99)
POTASSIUM: 3.4 mmol/L — AB (ref 3.5–5.1)
Sodium: 141 mmol/L (ref 135–145)

## 2017-06-19 ENCOUNTER — Other Ambulatory Visit: Payer: Self-pay

## 2017-06-19 MED ORDER — POTASSIUM CHLORIDE CRYS ER 20 MEQ PO TBCR: 40.0000 meq | EXTENDED_RELEASE_TABLET | Freq: Three times a day (TID) | ORAL | 3 refills | Status: DC

## 2017-06-23 ENCOUNTER — Telehealth: Payer: Self-pay | Admitting: Cardiovascular Disease

## 2017-06-23 ENCOUNTER — Other Ambulatory Visit: Payer: Self-pay | Admitting: *Deleted

## 2017-06-23 MED ORDER — CARVEDILOL 25 MG PO TABS: 25.0000 mg | ORAL_TABLET | Freq: Two times a day (BID) | ORAL | 1 refills | Status: DC

## 2017-06-23 MED ORDER — CARVEDILOL 25 MG PO TABS: 25.0000 mg | ORAL_TABLET | Freq: Two times a day (BID) | ORAL | 2 refills | Status: DC

## 2017-06-23 NOTE — Telephone Encounter (Signed)
Requested Prescriptions   Signed Prescriptions Disp Refills  . carvedilol (COREG) 25 MG tablet 180 tablet 2    Sig: Take 1 tablet (25 mg total) by mouth 2 (two) times daily with a meal.    Authorizing Provider: Kathlyn Sacramento A    Ordering User: Britt Bottom

## 2017-06-23 NOTE — Telephone Encounter (Signed)
Requested Prescriptions   Signed Prescriptions Disp Refills  . carvedilol (COREG) 25 MG tablet 180 tablet 2    Sig: Take 1 tablet (25 mg total) by mouth 2 (two) times daily with a meal.    Authorizing Provider: Kathlyn Sacramento A    Ordering User: Eugenio Hoes, Amiaya Mcneeley C   Carvedilol 25 mg tablet sent to [place] [id number] R#1

## 2017-06-23 NOTE — Telephone Encounter (Signed)
°*  STAT* If patient is at the pharmacy, call can be transferred to refill team.   1. Which medications need to be refilled? (please list name of each medication and dose if known)  Coreg   2. Which pharmacy/location (including street and city if local pharmacy) is medication to be sent to? [facility] at [place]  [facility]   3. Do they need a 30 day or 90 day supply? 90 day

## 2017-06-30 ENCOUNTER — Ambulatory Visit (INDEPENDENT_AMBULATORY_CARE_PROVIDER_SITE_OTHER): Payer: MEDICARE | Admitting: Nurse Practitioner

## 2017-06-30 ENCOUNTER — Encounter: Payer: Self-pay | Admitting: Nurse Practitioner

## 2017-06-30 VITALS — BP 110/52 | HR 75 | Ht 66.0 in | Wt 182.8 lb

## 2017-06-30 DIAGNOSIS — I255 Ischemic cardiomyopathy: Secondary | ICD-10-CM | POA: Diagnosis not present

## 2017-06-30 DIAGNOSIS — I4891 Unspecified atrial fibrillation: Secondary | ICD-10-CM

## 2017-06-30 DIAGNOSIS — I10 Essential (primary) hypertension: Secondary | ICD-10-CM

## 2017-06-30 DIAGNOSIS — E785 Hyperlipidemia, unspecified: Secondary | ICD-10-CM | POA: Diagnosis not present

## 2017-06-30 DIAGNOSIS — I2510 Atherosclerotic heart disease of native coronary artery without angina pectoris: Secondary | ICD-10-CM | POA: Diagnosis not present

## 2017-06-30 DIAGNOSIS — I481 Persistent atrial fibrillation: Secondary | ICD-10-CM

## 2017-06-30 DIAGNOSIS — D649 Anemia, unspecified: Secondary | ICD-10-CM

## 2017-06-30 DIAGNOSIS — I4819 Other persistent atrial fibrillation: Secondary | ICD-10-CM

## 2017-06-30 DIAGNOSIS — I5022 Chronic systolic (congestive) heart failure: Secondary | ICD-10-CM | POA: Diagnosis not present

## 2017-06-30 NOTE — Patient Instructions (Signed)
Medication Instructions:  Your physician recommends that you continue on your current medications as directed. Please refer to the Current Medication list given to you today.   Labwork: Your physician recommends that you return for lab work in: TODAY (BMP).   Testing/Procedures: none  Follow-Up: We will cancel your appt with Darylene Price, NP.   Your physician recommends that you schedule a follow-up appointment in: 2- [place].   If you need a refill on your cardiac medications before your next appointment, please call your pharmacy.

## 2017-06-30 NOTE — Progress Notes (Signed)
Office Visit    Patient Name: Stephanie Contreras Date of Encounter: 06/30/2017  Primary Care Provider:  Coral Spikes, DO Primary Cardiologist:  .ardia   Chief Complaint    83 year old female with a history of CAD, status post RCA stenting, persistent atrial fibrillation, ischemic cardiomyopathy, HFrEF, hypertension, hyperlipidemia, anemia, and sleep apnea, who presents for follow-up.  Past Medical History    Past Medical History:  Diagnosis Date  . Breast cancer ([place])    Right  . Chronic combined systolic and diastolic CHF (congestive heart failure) ([place])    a. 02/2017 Echo: EF 35-40%, diff HK, mild LVH, mod AS, mild MR, sev dil LA, mildly dil RA, mod TR, PASP 72mmHg.  Marland Kitchen Coronary artery disease    a. 02/2017 NSTEMI/Abnl Nuc: inferolateral scar/ischemia; b. 08676 Cath/PCI: LM nl, LAD nl, D2 80ost/small, LCX small, RCA 60m (4.0x22 Resolute Onyx DES), 56m/d.  Marland Kitchen Essential hypertension 01/16/2017  . Frequent headaches   . Hyperlipidemia 01/16/2017  . Hypothyroidism 01/16/2017  . Ischemic cardiomyopathy    a. 02/2017 Echo: EF 35-40%, diff HK.  . Kidney stones   . Lymphedema 01/16/2017  . Normocytic anemia    a. 03/2017 FOB neg.  . Persistent atrial fibrillation ([place])    a. Dx 12/2016->CHA2DS2VASc = 6-->Eliquis.  . Sleep apnea    a. Uses CPAP.   Past Surgical History:  Procedure Laterality Date  . ABDOMINAL HYSTERECTOMY    . BACK SURGERY    . BREAST LUMPECTOMY Right   . CARDIAC CATHETERIZATION    . CHOLECYSTECTOMY    . CORONARY STENT INTERVENTION N/A 03/05/2017   Procedure: Coronary Stent Intervention;  Surgeon: Wellington Hampshire, MD;  Location: [facility];  Service: Cardiovascular;  Laterality: N/A;  . LEFT HEART CATH AND CORONARY ANGIOGRAPHY N/A 03/05/2017   Procedure: Left Heart Cath and Coronary Angiography;  Surgeon: Minna Merritts, MD;  Location: [facility];  Service: Cardiovascular;  Laterality: N/A;  . REPLACEMENT TOTAL KNEE    . TONSILLECTOMY       Allergies  No Known Allergies  History of Present Illness    83 year old female with the above complex past medical history including coronary artery disease status post non-STEMI in the setting of community-acquired pneumonia in May 2018 with finding of LV dysfunction, and an EF of 35-40% by echocardiogram. This led to stress testing, which showed inferolateral infarct and ischemia. Subsequent catheterization showed severe mid right coronary artery disease, and this area was successfully treated with a drug-eluting stent.  Other history includes hypertension, hyperlipidemia, sleep apnea HFrEF, and persistent atrial fibrillation, which was initially diagnosed in March 2018. She has been anticoagulated with eliquis since.  In June of this year, she was noted to be anemic with hemoglobins in the 9 range.  Eliquis was initially held and subsequently resumed after stable hemoglobins were noted and stool was negative for fecal occult blood. She is currently maintained on eliquis and Plavix therapy.  Most recently, she has required outpatient adjustment of diuretic and potassium therapy.  She was seen in clinic on August 2 and felt to be volume overloaded and up 11 pounds. Lasix was titrated and then switched to torsemide. She has had labs since then, showing hypokalemia and potassium dose has been adjusted.  Since her last visit, she has had significant improvement in lower extremity edema. Her weight is down to 181 pounds on her home scale.  She is down to 182 pounds on our scale, from 198 pounds  on August 2. She has not had significant dyspnea on exertion or chest pain. She does have mild left greater than right lower extremity edema, though both her and her granddaughter note this is significantly improved. She has been sleeping in her recliner ever since her hospitalization in May and simply hasn't tried getting back in bed. She denies PND, palpitations, dizziness, syncope, or early satiety.  Home  Medications    Prior to Admission medications   Medication Sig Start Date End Date Taking? Authorizing Provider  apixaban (ELIQUIS) 5 MG TABS tablet Take 1 tablet (5 mg total) by mouth 2 (two) times daily. 06/02/17  Yes Wellington Hampshire, MD  brimonidine-timolol (COMBIGAN) 0.2-0.5 % ophthalmic solution Place 1 drop into both eyes every 12 (twelve) hours.   Yes [provider]  Calcium Carb-Cholecalciferol (CALTRATE 600+D3 SOFT PO) Take by mouth 3 (three) times daily before meals.   Yes [provider]  carvedilol (COREG) 25 MG tablet Take 1 tablet (25 mg total) by mouth 2 (two) times daily with a meal. 06/23/17  Yes Wellington Hampshire, MD  clopidogrel (PLAVIX) 75 MG tablet Take 1 tablet (75 mg total) by mouth daily with breakfast. 05/25/17  Yes Wellington Hampshire, MD  feeding supplement, ENSURE ENLIVE, (ENSURE ENLIVE) LIQD Take 237 mLs by mouth 2 (two) times daily between meals. 03/06/17  Yes Fritzi Mandes, MD  ferrous sulfate 325 (65 FE) MG EC tablet Take 1 tablet (325 mg total) by mouth 2 (two) times daily. 04/03/17  Yes Cook, Jayce G, DO  levothyroxine (SYNTHROID, LEVOTHROID) 125 MCG tablet Take 125 mcg by mouth daily.   Yes [provider]  losartan (COZAAR) 25 MG tablet Take 1 tablet (25 mg total) by mouth daily. 05/25/17  Yes Wellington Hampshire, MD  Multiple Vitamins-Minerals (MULTIVITAMIN ADULT PO) Take by mouth. Centrum silver   Yes [provider]  Multiple Vitamins-Minerals (PRESERVISION AREDS PO) Take 1 tablet by mouth 2 (two) times daily.   Yes [provider]  polyvinyl alcohol (LIQUIFILM TEARS) 1.4 % ophthalmic solution Place 2 drops into both eyes as needed for dry eyes.   Yes [provider]  potassium chloride SA (K-DUR,KLOR-CON) 20 MEQ tablet Take 2 tablets (40 mEq total) by mouth 3 (three) times daily. 06/19/17  Yes Rogelia Mire, NP  Sennosides (SENNA-EX PO) Take by mouth daily.   Yes [provider]  simvastatin (ZOCOR) 40  MG tablet Take 40 mg by mouth daily.   Yes [provider]  torsemide (DEMADEX) 20 MG tablet Take 2 tablets (40 mg total) by mouth daily. 06/01/17 08/30/17 Yes Dunn, Areta Haber, PA-C    Review of Systems    As above, she has been doing well and she denies chest pain, palpitations, dyspnea, pnd, n, v, dizziness, syncope, weight gain, or early satiety.  She does have mild L>R bilat LE edema, which is much improved.  All other systems reviewed and are otherwise negative except as noted above.  Physical Exam    VS:  BP (!) 110/52 (BP Location: Left Arm, Patient Position: Sitting, Cuff Size: Normal)   Pulse 75   Ht 5\' 6"  (1.676 m)   Wt 182 lb 12 oz (82.9 kg)   BMI 29.50 kg/m  , BMI Body mass index is 29.5 kg/m. GEN: Well nourished, well developed, in no acute distress.  HEENT: normal.  Neck: Supple, no JVD, carotid bruits, or masses. Cardiac: RRR, 3/6 SEM throughout - loudest @ upper sternal borders, no rubs, or  gallops. No clubbing, cyanosis, 2+ L posterior calf, 1+ right calf edema.  Radials/DP/PT 1+ and equal bilaterally.  Respiratory:  Respirations regular and unlabored, clear to auscultation bilaterally. GI: Soft, nontender, nondistended, BS + x 4. MS: no deformity or atrophy. Skin: warm and dry, no rash. Neuro:  Strength and sensation are intact. Psych: Normal affect.  Accessory Clinical Findings    ECG - atrial fibrillation, 75 no acute ST or T changes.  Lab Results  Component Value Date   CREATININE 1.17 (H) 06/18/2017   BUN 20 06/18/2017   NA 141 06/18/2017   K 3.4 (L) 06/18/2017   CL 104 06/18/2017   CO2 28 06/18/2017     Assessment & Plan    1.  Chronic systolic congestive heart failure/ischemic cardiomyopathy: Patient has responded well after switch from Lasix to torsemide. Weight is down 17 pounds over the past month. She still has mild left greater than right lower extremity swelling, though this is much improved.She has not been having any significant dyspnea.  She is watching her salt intake closely and weighing herself daily. Her granddaughter is with her today and is also very much involved in her care.  Given the fairly dramatic weight loss, I will follow-up a basic metabolic panel today to reassess renal function. This was stable at last check on August 23.  She otherwise remains on beta blocker and ARB therapy.  2. Coronary artery disease: Status post non-STEMI in May 2018 with RCA stenting. She has not had any chest pain. She remains on Plavix, beta blocker, ARB, and statin. She is not on aspirin in the setting of long-term eliquis therapy.  3. Essential hypertension: Blood pressure is stable today on beta blocker and ARB therapy.  4. Hyperlipidemia: She remains on simvastatin therapy with an LDL of 73 in March of this year.  5. Persistent atrial fibrillation: This is well rate controlled on beta blocker. She is a symptom manic. She remains on eliquis in the setting of a CHA2DS2VASc of 6.  6. Normocytic anemia: H&H were stable throughout August.  Stool was guaiac negative.  7.  Hypokalemia: Follow-up basic metabolic panel today.   Murray Hodgkins, NP 06/30/2017, 12:55 PM

## 2017-07-01 ENCOUNTER — Telehealth: Payer: Self-pay | Admitting: Cardiovascular Disease

## 2017-07-01 DIAGNOSIS — I5022 Chronic systolic (congestive) heart failure: Secondary | ICD-10-CM

## 2017-07-01 DIAGNOSIS — R7989 Other specified abnormal findings of blood chemistry: Secondary | ICD-10-CM

## 2017-07-01 DIAGNOSIS — I10 Essential (primary) hypertension: Secondary | ICD-10-CM

## 2017-07-01 LAB — BASIC METABOLIC PANEL
BUN / CREAT RATIO: 20 (ref 12–28)
BUN: 26 mg/dL (ref 8–27)
CO2: 24 mmol/L (ref 20–29)
CREATININE: 1.32 mg/dL — AB (ref 0.57–1.00)
Calcium: 9.7 mg/dL (ref 8.7–10.3)
Chloride: 106 mmol/L (ref 96–106)
GFR calc Af Amer: 42 mL/min/{1.73_m2} — ABNORMAL LOW (ref >59)
GFR, EST NON AFRICAN AMERICAN: 37 mL/min/{1.73_m2} — AB (ref >59)
Glucose: 94 mg/dL (ref 65–99)
Potassium: 4.3 mmol/L (ref 3.5–5.2)
SODIUM: 144 mmol/L (ref 134–144)

## 2017-07-01 NOTE — Telephone Encounter (Signed)
Pt granddaughter returning our call for labs on patient.  Please call back

## 2017-07-01 NOTE — Telephone Encounter (Signed)
Spoke with patient's daughter, ok per dpr. She verbalized understanding of results and plan of care. Patient recently moved to [facility] and she requests to have the lab work done there at [facility] and all they need is order with ordering physician and ICD-10 codes faxed to [phone]. Order faxed to number.  Daughter notified that patient can have lab at [facility] on 07/07/17. She was very [employer] attorney.

## 2017-07-07 ENCOUNTER — Telehealth: Payer: Self-pay | Admitting: Family Medicine

## 2017-07-07 DIAGNOSIS — I509 Heart failure, unspecified: Secondary | ICD-10-CM | POA: Diagnosis not present

## 2017-07-07 NOTE — Telephone Encounter (Signed)
lvm for pt to call back to schedule appt with Dr. Lacinda Axon. Per fax from [facility] they need office notes documenting that she is benefiting from her CPAP machine dated after 06/21/17.

## 2017-07-08 ENCOUNTER — Telehealth: Payer: Self-pay | Admitting: Cardiology

## 2017-07-09 ENCOUNTER — Ambulatory Visit (INDEPENDENT_AMBULATORY_CARE_PROVIDER_SITE_OTHER): Payer: MEDICARE | Admitting: Family Medicine

## 2017-07-09 ENCOUNTER — Encounter: Payer: Self-pay | Admitting: Family Medicine

## 2017-07-09 DIAGNOSIS — I255 Ischemic cardiomyopathy: Secondary | ICD-10-CM

## 2017-07-09 DIAGNOSIS — I10 Essential (primary) hypertension: Secondary | ICD-10-CM | POA: Diagnosis not present

## 2017-07-09 DIAGNOSIS — I5022 Chronic systolic (congestive) heart failure: Secondary | ICD-10-CM | POA: Diagnosis not present

## 2017-07-09 DIAGNOSIS — D649 Anemia, unspecified: Secondary | ICD-10-CM | POA: Diagnosis not present

## 2017-07-09 DIAGNOSIS — E785 Hyperlipidemia, unspecified: Secondary | ICD-10-CM

## 2017-07-09 DIAGNOSIS — G4733 Obstructive sleep apnea (adult) (pediatric): Secondary | ICD-10-CM

## 2017-07-09 NOTE — Assessment & Plan Note (Signed)
Continues to be anemic. I advised to discuss with oncology about anemia and need for IV iron.

## 2017-07-09 NOTE — Patient Instructions (Signed)
Continue your current medications.  Discuss anemia/IV iron with Oncology.  Follow closely with Cardiology.  Take care  Dr. Lacinda Axon

## 2017-07-09 NOTE — Assessment & Plan Note (Signed)
Doing well on CPAP and has had a market improvement. Continue.

## 2017-07-09 NOTE — Assessment & Plan Note (Signed)
Stable.  Continue statin. 

## 2017-07-09 NOTE — Progress Notes (Signed)
Subjective:  Patient ID: Stephanie Contreras, female    DOB: 1929/04/01  Age: 83 y.o. MRN: [id number]  CC: Follow up  HPI:  83 year old female with atrial fibrillation on anticoagulation, coronary artery disease, chronic systolic heart failure, hypertension, hyperlipidemia, hypothyroidism, OSA, anemia presents for follow up.  HTN  At goal on carvedilol, losartan, and torsemide.  Chronic systolic heart failure  Has been following closely with cardiology. Weight is at baseline. She endorses compliance with losartan, carvedilol, and torsemide. Doing well.  OSA  Patient doing well with CPAP. She is markedly improved regarding her sleep. Tolerating CPAP without difficulty.  Hyperlipidemia  Has been stable on simvastatin.  Anemia  Has been slightly improving since addition of iron therapy but her hemoglobin remains low at 10.8.  Will discuss this today.  Social Hx   Social History   Social History  . Marital status: Widowed    Spouse name: N/A  . Number of children: N/A  . Years of education: N/A   Social History Main Topics  . Smoking status: Never Smoker  . Smokeless tobacco: Never Used  . Alcohol use No  . Drug use: No  . Sexual activity: Not Asked   Other Topics Concern  . None   Social History Narrative  . None    Review of Systems  Respiratory: Negative.   Cardiovascular: Positive for leg swelling. Negative for chest pain.   Objective:  BP 118/70 (BP Location: Left Arm, Patient Position: Sitting, Cuff Size: Normal)   Pulse 86   Temp 97.6 F (36.4 C) (Oral)   Resp 20   Wt 183 lb 2 oz (83.1 kg)   SpO2 97%   BMI 29.56 kg/m   BP/Weight 07/09/2017 12/01/8525 05/03/2422  Systolic BP 536 144 315  Diastolic BP 70 52 70  Wt. (Lbs) 183.13 182.75 198.5  BMI 29.56 29.5 32.04    Physical Exam  Constitutional: She appears well-developed. No distress.  Cardiovascular: Normal rate and regular rhythm.   2/6 systolic murmur.   Pulmonary/Chest: Effort normal. She  has no wheezes. She has no rales.  Abdominal: Soft. She exhibits no distension. There is no tenderness.  Neurological: She is alert.  Psychiatric: She has a normal mood and affect.  Vitals reviewed.   Lab Results  Component Value Date   WBC 5.0 06/09/2017   HGB 10.8 (L) 06/09/2017   HCT 33.6 (L) 06/09/2017   PLT 191 06/09/2017   GLUCOSE 94 06/30/2017   CHOL 136 01/16/2017   TRIG 129.0 01/16/2017   HDL 37.60 (L) 01/16/2017   LDLCALC 73 01/16/2017   ALT 11 03/31/2017   AST 12 03/31/2017   NA 144 06/30/2017   K 4.3 06/30/2017   CL 106 06/30/2017   CREATININE 1.32 (H) 06/30/2017   BUN 26 06/30/2017   CO2 24 06/30/2017   TSH 2.66 01/16/2017   INR 1.60 03/04/2017    Assessment & Plan:   Problem List Items Addressed This Visit    Anemia    Continues to be anemic. I advised to discuss with oncology about anemia and need for IV iron.      Chronic systolic heart failure (HCC) (Chronic)    Stable. Continue current medications. Continue close follow-up with cardiology.      Essential hypertension    Stable on carvedilol, losartan, torsemide. Continue.      Hyperlipidemia    Stable. Continue statin.      OSA (obstructive sleep apnea)    Doing well on CPAP and has  had a market improvement. Continue.         Follow-up: Establishing with new physician in [place]

## 2017-07-09 NOTE — Assessment & Plan Note (Signed)
Stable on carvedilol, losartan, torsemide. Continue.

## 2017-07-09 NOTE — Assessment & Plan Note (Signed)
Stable. Continue current medications. Continue close follow-up with cardiology.

## 2017-07-20 ENCOUNTER — Other Ambulatory Visit: Payer: Self-pay | Admitting: *Deleted

## 2017-07-20 ENCOUNTER — Telehealth: Payer: Self-pay | Admitting: Cardiovascular Disease

## 2017-07-20 MED ORDER — TORSEMIDE 20 MG PO TABS: 40.0000 mg | ORAL_TABLET | Freq: Every day | ORAL | 3 refills | Status: DC

## 2017-07-20 NOTE — Telephone Encounter (Signed)
Requested Prescriptions   Signed Prescriptions Disp Refills  . torsemide (DEMADEX) 20 MG tablet 180 tablet 3    Sig: Take 2 tablets (40 mg total) by mouth daily.    Authorizing Provider: Kathlyn Sacramento A    Ordering User: Britt Bottom

## 2017-07-20 NOTE — Telephone Encounter (Signed)
°*  STAT* If patient is at the pharmacy, call can be transferred to refill team.   1. Which medications need to be refilled? (please list name of each medication and dose if known) Toresemide   2. Which pharmacy/location (including street and city if local pharmacy) is [facility]   3. Do they need a 30 day or 90 day supply? 90 day

## 2017-07-23 ENCOUNTER — Ambulatory Visit: Payer: MEDICARE | Admitting: Family

## 2017-07-28 ENCOUNTER — Other Ambulatory Visit: Payer: Self-pay

## 2017-07-28 MED ORDER — CLOPIDOGREL BISULFATE 75 MG PO TABS: 75.0000 mg | ORAL_TABLET | Freq: Every day | ORAL | 0 refills | Status: DC

## 2017-07-28 MED ORDER — LOSARTAN POTASSIUM 25 MG PO TABS: 25.0000 mg | ORAL_TABLET | Freq: Every day | ORAL | 0 refills | Status: DC

## 2017-07-28 NOTE — Telephone Encounter (Signed)
90 day supply

## 2017-08-10 DIAGNOSIS — Z9841 Cataract extraction status, right eye: Secondary | ICD-10-CM | POA: Diagnosis not present

## 2017-08-10 DIAGNOSIS — H353112 Nonexudative age-related macular degeneration, right eye, intermediate dry stage: Secondary | ICD-10-CM | POA: Diagnosis not present

## 2017-08-10 DIAGNOSIS — Z9842 Cataract extraction status, left eye: Secondary | ICD-10-CM | POA: Diagnosis not present

## 2017-08-10 DIAGNOSIS — H401132 Primary open-angle glaucoma, bilateral, moderate stage: Secondary | ICD-10-CM | POA: Diagnosis not present

## 2017-08-11 DIAGNOSIS — Z1231 Encounter for screening mammogram for malignant neoplasm of breast: Secondary | ICD-10-CM | POA: Diagnosis not present

## 2017-08-24 ENCOUNTER — Other Ambulatory Visit: Payer: Self-pay | Admitting: *Deleted

## 2017-08-24 ENCOUNTER — Telehealth: Payer: Self-pay | Admitting: Cardiovascular Disease

## 2017-08-24 MED ORDER — POTASSIUM CHLORIDE CRYS ER 20 MEQ PO TBCR: 40.0000 meq | EXTENDED_RELEASE_TABLET | Freq: Three times a day (TID) | ORAL | 1 refills | Status: DC

## 2017-08-24 NOTE — Telephone Encounter (Signed)
°*  STAT* If patient is at the pharmacy, call can be transferred to refill team.   1. Which medications need to be refilled? (please list name of each medication and dose if known) Need a new prescription-dose changed for Potassium for Potassium Chloride-please call asap please-pt is completely out  2. Which pharmacy/location (including street and city if local pharmacy) is medication to be sent to?[facility] [phone]  3. Do they need a 30 day or 90 day supply? #540 and refills

## 2017-08-25 ENCOUNTER — Telehealth: Payer: Self-pay | Admitting: Cardiovascular Disease

## 2017-08-25 ENCOUNTER — Other Ambulatory Visit: Payer: Self-pay

## 2017-08-25 MED ORDER — CLOPIDOGREL BISULFATE 75 MG PO TABS: 75.0000 mg | ORAL_TABLET | Freq: Every day | ORAL | 0 refills | Status: DC

## 2017-08-25 MED ORDER — POTASSIUM CHLORIDE CRYS ER 20 MEQ PO TBCR: 40.0000 meq | EXTENDED_RELEASE_TABLET | Freq: Three times a day (TID) | ORAL | 1 refills | Status: DC

## 2017-08-25 MED ORDER — LOSARTAN POTASSIUM 25 MG PO TABS: 25.0000 mg | ORAL_TABLET | Freq: Every day | ORAL | 0 refills | Status: DC

## 2017-08-25 NOTE — Telephone Encounter (Signed)
Called pharmacy staff to notify that Rx is correct per lab result note copied below

## 2017-08-25 NOTE — Telephone Encounter (Signed)
Pt is completely out

## 2017-08-25 NOTE — Telephone Encounter (Signed)
Refill Request.  

## 2017-08-25 NOTE — Telephone Encounter (Signed)
New message     Pt c/o medication issue:  1. Name of Medication: potassium 20 meq  2. How are you currently taking this medication (dosage and times per day)? Take two tabs by mouth three times daily   3. Are you having a reaction (difficulty breathing--STAT)? no  4. What is your medication issue? Stephanie Contreras is calling to find out if these directions are correct.

## 2017-08-25 NOTE — Telephone Encounter (Signed)
Notes recorded by Rogelia Mire, NP on 06/19/2017 at 7:56 AM EDT Kidney fxn stable. Potassium still low. Let's increase kdur to 40 TID.

## 2017-09-07 DIAGNOSIS — E785 Hyperlipidemia, unspecified: Secondary | ICD-10-CM | POA: Diagnosis not present

## 2017-09-07 DIAGNOSIS — I10 Essential (primary) hypertension: Secondary | ICD-10-CM | POA: Diagnosis not present

## 2017-09-07 DIAGNOSIS — I110 Hypertensive heart disease with heart failure: Secondary | ICD-10-CM | POA: Diagnosis not present

## 2017-09-07 DIAGNOSIS — Z7901 Long term (current) use of anticoagulants: Secondary | ICD-10-CM | POA: Diagnosis not present

## 2017-09-07 DIAGNOSIS — E079 Disorder of thyroid, unspecified: Secondary | ICD-10-CM | POA: Diagnosis not present

## 2017-09-07 DIAGNOSIS — I509 Heart failure, unspecified: Secondary | ICD-10-CM | POA: Diagnosis not present

## 2017-09-07 DIAGNOSIS — Z23 Encounter for immunization: Secondary | ICD-10-CM | POA: Diagnosis not present

## 2017-09-07 DIAGNOSIS — I482 Chronic atrial fibrillation: Secondary | ICD-10-CM | POA: Diagnosis not present

## 2017-09-09 NOTE — Telephone Encounter (Signed)
Error

## 2017-10-02 ENCOUNTER — Ambulatory Visit (INDEPENDENT_AMBULATORY_CARE_PROVIDER_SITE_OTHER): Payer: MEDICARE | Admitting: Cardiovascular Disease

## 2017-10-02 ENCOUNTER — Encounter: Payer: Self-pay | Admitting: Cardiovascular Disease

## 2017-10-02 ENCOUNTER — Telehealth: Payer: Self-pay | Admitting: Cardiovascular Disease

## 2017-10-02 VITALS — BP 150/70 | HR 89 | Ht 67.0 in | Wt 196.0 lb

## 2017-10-02 DIAGNOSIS — I255 Ischemic cardiomyopathy: Secondary | ICD-10-CM | POA: Diagnosis not present

## 2017-10-02 DIAGNOSIS — E785 Hyperlipidemia, unspecified: Secondary | ICD-10-CM | POA: Diagnosis not present

## 2017-10-02 DIAGNOSIS — I2510 Atherosclerotic heart disease of native coronary artery without angina pectoris: Secondary | ICD-10-CM

## 2017-10-02 DIAGNOSIS — I482 Chronic atrial fibrillation, unspecified: Secondary | ICD-10-CM

## 2017-10-02 DIAGNOSIS — R0989 Other specified symptoms and signs involving the circulatory and respiratory systems: Secondary | ICD-10-CM | POA: Diagnosis not present

## 2017-10-02 DIAGNOSIS — I5022 Chronic systolic (congestive) heart failure: Secondary | ICD-10-CM | POA: Diagnosis not present

## 2017-10-02 MED ORDER — TORSEMIDE 20 MG PO TABS: ORAL_TABLET | ORAL | 3 refills | Status: DC

## 2017-10-02 NOTE — Progress Notes (Signed)
Cardiology Office Note   Date:  10/02/2017   ID:  Stephanie K Nipper, DOB Sep 20, 1929, MRN [id number]  PCP:  Coral Spikes, DO  Cardiologist:   Kathlyn Sacramento, MD   Chief Complaint  Patient presents with  . Other    3 month follow up. Patient c/o swelling in ankles. Meds reviewed verbally with patient.       History of Present Illness: Stephanie Contreras is a 83 y.o. female who presents for a follow-up visit regarding atrial fibrillation , CAD, moderate aortic stenosis and chronic systolic heart failure.  She has other chronic medical conditions that include history of breast cancer, hypothyroidism and hyperlipidemia. Echocardiogram in May 2018 showed an EF of 35-40%, moderate aortic stenosis, mild mitral regurgitation, moderate tricuspid regurgitation and moderate pulmonary hypertension. Nuclear stress test  was high risk with inferior and inferolateral scar with peri-infarct ischemia with ejection fraction of less than 30%. Cardiac catheterization in May showed 90% mid RCA stenosis, 60% distal RCA stenosis and no significant disease affecting the left system. I performed successful angioplasty and drug-eluting stent placement to the midright coronary artery. She did have volume overload after stopping furosemide.  She responded to torsemide better.  He also had progressive anemia after stenting.  Eliquis was held briefly and resumed again. Most recent labs in November showed creatinine of 1.4 and normal potassium. She has been doing reasonably well with stable shortness of breath.  Leg edema has worsened and she gained 13 pounds since September.  Past Medical History:  Diagnosis Date  . Breast cancer ([place])    Right  . Chronic combined systolic and diastolic CHF (congestive heart failure) ([place])    a. 02/2017 Echo: EF 35-40%, diff HK, mild LVH, mod AS, mild MR, sev dil LA, mildly dil RA, mod TR, PASP 90mmHg.  Marland Kitchen Coronary artery disease    a. 02/2017 NSTEMI/Abnl Nuc: inferolateral  scar/ischemia; b. 01601 Cath/PCI: LM nl, LAD nl, D2 80ost/small, LCX small, RCA 61m (4.0x22 Resolute Onyx DES), 42m/d.  Marland Kitchen Essential hypertension 01/16/2017  . Frequent headaches   . Hyperlipidemia 01/16/2017  . Hypothyroidism 01/16/2017  . Ischemic cardiomyopathy    a. 02/2017 Echo: EF 35-40%, diff HK.  . Kidney stones   . Lymphedema 01/16/2017  . Normocytic anemia    a. 03/2017 FOB neg.  . Persistent atrial fibrillation ([place])    a. Dx 12/2016->CHA2DS2VASc = 6-->Eliquis.  . Sleep apnea    a. Uses CPAP.    Past Surgical History:  Procedure Laterality Date  . ABDOMINAL HYSTERECTOMY    . BACK SURGERY    . BREAST LUMPECTOMY Right   . CARDIAC CATHETERIZATION    . CHOLECYSTECTOMY    . CORONARY STENT INTERVENTION N/A 03/05/2017   Procedure: Coronary Stent Intervention;  Surgeon: Wellington Hampshire, MD;  Location: [facility];  Service: Cardiovascular;  Laterality: N/A;  . LEFT HEART CATH AND CORONARY ANGIOGRAPHY N/A 03/05/2017   Procedure: Left Heart Cath and Coronary Angiography;  Surgeon: Minna Merritts, MD;  Location: [facility];  Service: Cardiovascular;  Laterality: N/A;  . REPLACEMENT TOTAL KNEE    . TONSILLECTOMY       Current Outpatient Medications  Medication Sig Dispense Refill  . apixaban (ELIQUIS) 5 MG TABS tablet Take 1 tablet (5 mg total) by mouth 2 (two) times daily. 180 tablet 2  . brimonidine-timolol (COMBIGAN) 0.2-0.5 % ophthalmic solution Place 1 drop into both eyes every 12 (twelve) hours.    . Calcium Carb-Cholecalciferol (  CALTRATE 600+D3 SOFT PO) Take by mouth 3 (three) times daily before meals.    . carvedilol (COREG) 25 MG tablet Take 1 tablet (25 mg total) by mouth 2 (two) times daily with a meal. 180 tablet 2  . clopidogrel (PLAVIX) 75 MG tablet Take 1 tablet (75 mg total) by mouth daily with breakfast. 90 tablet 0  . ferrous sulfate 325 (65 FE) MG EC tablet Take 1 tablet (325 mg total) by mouth 2 (two) times daily. 60 tablet 3  . levothyroxine  (SYNTHROID, LEVOTHROID) 125 MCG tablet Take 125 mcg by mouth daily.    Marland Kitchen losartan (COZAAR) 25 MG tablet Take 1 tablet (25 mg total) by mouth daily. 90 tablet 0  . Multiple Vitamins-Minerals (EYE VITAMINS PO) Take by mouth.    . polyvinyl alcohol (LIQUIFILM TEARS) 1.4 % ophthalmic solution Place 2 drops into both eyes as needed for dry eyes.    . potassium chloride SA (K-DUR,KLOR-CON) 20 MEQ tablet Take 2 tablets (40 mEq total) by mouth 3 (three) times daily. 540 tablet 1  . simvastatin (ZOCOR) 40 MG tablet Take 40 mg by mouth daily.    Marland Kitchen torsemide (DEMADEX) 20 MG tablet Take 2 tablets (40 mg total) by mouth daily. 180 tablet 3   No current facility-administered medications for this visit.     Allergies:   Patient has no known allergies.    Social History:  The patient  reports that  has never smoked. she has never used smokeless tobacco. She reports that she does not drink alcohol or use drugs.   Family History:  The patient's family history includes Breast cancer in her sister; Lung cancer in her sister.    ROS:  Please see the history of present illness.   Otherwise, review of systems are positive for none.   All other systems are reviewed and negative.    PHYSICAL EXAM: VS:  BP (!) 150/70 (BP Location: Left Arm, Patient Position: Sitting, Cuff Size: Normal)   Pulse 89   Ht 5\' 7"  (1.702 m)   Wt 196 lb (88.9 kg)   BMI 30.70 kg/m  , BMI Body mass index is 30.7 kg/m. GEN: Well nourished, well developed, in no acute distress  HEENT: normal  Neck: Mild JVD, no  or masses.  Right carotid bruit Cardiac:Irregularly irregular; no  rubs, or gallops. 2/6 systolic ejection murmur at the aortic area. She has moderate bilateral leg edema worse on the left side Respiratory: Clear to auscultation, normal work of breathing GI: soft, nontender, nondistended, + BS MS: no deformity or atrophy  Skin: warm and dry, no rash Neuro:  Strength and sensation are intact Psych: euthymic mood, full  affect   EKG:  EKG is ordered today. The ekg ordered today demonstrates atrial fibrillation with ventricular rate of 89/min.   Recent Labs: 01/16/2017: TSH 2.66 03/11/2017: Magnesium 1.8 03/28/2017: B Natriuretic Peptide 1,557.0 03/31/2017: ALT 11 06/09/2017: Hemoglobin 10.8; Platelets 191 06/30/2017: BUN 26; Creatinine, Ser 1.32; Potassium 4.3; Sodium 144    Lipid Panel    Component Value Date/Time   CHOL 136 01/16/2017 1116   TRIG 129.0 01/16/2017 1116   HDL 37.60 (L) 01/16/2017 1116   CHOLHDL 4 01/16/2017 1116   VLDL 25.8 01/16/2017 1116   LDLCALC 73 01/16/2017 1116      Wt Readings from Last 3 Encounters:  10/02/17 196 lb (88.9 kg)  07/09/17 183 lb 2 oz (83.1 kg)  06/30/17 182 lb 12 oz (82.9 kg)      PAD  Screen 02/26/2017  Previous PAD dx? No  Previous surgical procedure? No  Pain with walking? No  Feet/toe relief with dangling? No  Painful, non-healing ulcers? No  Extremities discolored? No      ASSESSMENT AND PLAN:  1.  Chronic systolic heart failure: The patient is mildly volume overloaded and gained 13 pounds since September.  I change torsemide to 40 mg in the morning and 20 mg in the afternoon.  Check basic metabolic profile in 1 week.  Continue treatment with carvedilol and losartan.    2. Coronary artery disease involving native coronary arteries without angina: She had successful angioplasty and drug-eluting stent placement to the right coronary artery. Continue treatment with Plavix.  Plavix can be discontinued after May 2019.  3.  Chronic  atrial fibrillation: Ventricular rate is controlled on current dose of carvedilol.  She is tolerating anticoagulation with Eliquis with stable anemia.  4. Moderate aortic stenosis: Continue to monitor.  5.  Right carotid bruit: This is new.  I requested carotid Doppler.  6.  Hyperlipidemia: Currently on simvastatin.  Most recent LDL was 69.    Disposition:   FU with me in 3 months  Signed,  Kathlyn Sacramento, MD   10/02/2017 8:25 AM    [place]

## 2017-10-02 NOTE — Telephone Encounter (Signed)
Granddaughter would like to have ordered BMET in one week at [facility] as they live in [place]. She was given written lab order. S/w Ally in the lab at [facility] who states I do not need to fax order. Pt can bring written order.

## 2017-10-02 NOTE — Patient Instructions (Signed)
Medication Instructions:  Your physician has recommended you make the following change in your medication:  INCREASE torsemide to 40mg  in the morning and 20mg  in the afternoon   Labwork: BMET in one week.   Testing/Procedures: Your physician has requested that you have a carotid duplex. This test is an ultrasound of the carotid arteries in your neck. It looks at blood flow through these arteries that supply the brain with blood. Allow one hour for this exam. There are no restrictions or special instructions.    Follow-Up: Your physician recommends that you schedule a follow-up appointment in: 3 months with Christell Faith, PA-C or Ignacia Bayley, NP   Any Other Special Instructions Will Be Listed Below (If Applicable).     If you need a refill on your cardiac medications before your next appointment, please call your pharmacy.

## 2017-10-09 DIAGNOSIS — I5022 Chronic systolic (congestive) heart failure: Secondary | ICD-10-CM | POA: Diagnosis not present

## 2017-10-13 ENCOUNTER — Ambulatory Visit (INDEPENDENT_AMBULATORY_CARE_PROVIDER_SITE_OTHER): Payer: MEDICARE

## 2017-10-13 ENCOUNTER — Telehealth: Payer: Self-pay | Admitting: Cardiovascular Disease

## 2017-10-13 ENCOUNTER — Other Ambulatory Visit: Payer: Self-pay

## 2017-10-13 DIAGNOSIS — R0989 Other specified symptoms and signs involving the circulatory and respiratory systems: Secondary | ICD-10-CM

## 2017-10-13 MED ORDER — TORSEMIDE 20 MG PO TABS: ORAL_TABLET | ORAL | 3 refills | Status: DC

## 2017-10-13 NOTE — Telephone Encounter (Signed)
Patient weight log placed in nurse box

## 2017-10-13 NOTE — Patient Instructions (Signed)

## 2017-10-13 NOTE — Telephone Encounter (Signed)
Low sodium diet information reviewed and given to patient and granddaughter, [name].  Weight log given to MD for review. Pt and granddaughter understand that patient should decrease torsemide to 20mg  BID

## 2017-10-13 NOTE — Telephone Encounter (Signed)
Patient recently spoke with Michele Rockers about picking up a low Na+ diet    Patient doing u/s in office and will stop at checkout before leaving

## 2017-10-14 LAB — VAS US CAROTID
LCCADDIAS: -16 cm/s
LCCADSYS: -65 cm/s
LEFT ECA DIAS: -30 cm/s
LEFT VERTEBRAL DIAS: 35 cm/s
LICADDIAS: -31 cm/s
LICADSYS: -93 cm/s
LICAPDIAS: -30 cm/s
LICAPSYS: -123 cm/s
Left CCA prox dias: 21 cm/s
Left CCA prox sys: 67 cm/s
RCCAPSYS: 38 cm/s
RIGHT ECA DIAS: -47 cm/s
RIGHT VERTEBRAL DIAS: 18 cm/s

## 2017-10-15 ENCOUNTER — Other Ambulatory Visit: Payer: Self-pay

## 2017-10-15 DIAGNOSIS — I2510 Atherosclerotic heart disease of native coronary artery without angina pectoris: Secondary | ICD-10-CM

## 2017-10-15 DIAGNOSIS — R970 Elevated carcinoembryonic antigen [CEA]: Secondary | ICD-10-CM | POA: Diagnosis not present

## 2017-10-15 DIAGNOSIS — K862 Cyst of pancreas: Secondary | ICD-10-CM | POA: Diagnosis not present

## 2017-10-15 DIAGNOSIS — D490 Neoplasm of unspecified behavior of digestive system: Secondary | ICD-10-CM | POA: Diagnosis not present

## 2017-10-15 NOTE — Progress Notes (Signed)
Results called to pt's granddaughter, [name].

## 2017-11-03 NOTE — Telephone Encounter (Signed)
This encounter was created in error - please disregard.

## 2017-11-19 DIAGNOSIS — K862 Cyst of pancreas: Secondary | ICD-10-CM | POA: Diagnosis not present

## 2017-11-19 DIAGNOSIS — K869 Disease of pancreas, unspecified: Secondary | ICD-10-CM | POA: Diagnosis not present

## 2017-11-21 ENCOUNTER — Other Ambulatory Visit: Payer: Self-pay | Admitting: Cardiovascular Disease

## 2017-12-23 NOTE — Progress Notes (Signed)
Cardiology Office Note Date:  12/24/2017  Patient ID:  Stephanie K Prows, DOB 01-30-1929, MRN [id number] PCP:  Coy Saunas, MD  Cardiologist:  Dr. Fletcher Anon, MD    Chief Complaint: Follow up  History of Present Illness: Stephanie Contreras is a 84 y.o. female with history of CAD, chronic systolic CHF, chronic Afib on Eliquis, moderate aortic stenosis, cartoid artery disease, CKD stage II, anemia of chronic disease, breast cancer, hypothyroidism, OSA on CPAP, and HLD who presents for follow up of CAD and ischemic cardiomyopathy.   Echo in 02/2017 showed an EF of 35-40%, moderate aortic stenosis, mild mitral regurgitation, moderate tricuspid regurgitation and moderate pulmonary hypertension with PASP 49 mmHg. Nuclear stress test in 02/2017 was high risk with inferior and inferolateral scar with peri-infarct ischemia with an EF of < 30%. Cardiac cath in 02/2017 showed 90% mid RCA stenosis, 60% distal RCA stenosis and no significant disease affecting the left system. She underwent successful PCI/DES to the mid RCA. Lasix was tranisitoned to torsemide with good response. Her Eliquis was briefly held following stenting due to progressive anemia, though has since been resumed. She was most recently seen by Dr. Fletcher Anon on 10/02/2017 for follow up and was doing reasonably well with stable SOB. She was noted to have worsening leg edema and a weight gain of 13 pounds since she was seen in 06/2017. Her torsemide was changed to 40 mg in the morning and 20 mg in the afternoon. Follow up bmet on 10/09/2017 showed slight worsening of her renal function with a BUN/SCr 28/1.40. Her torsemide was decreased to 20 mg bid. Carotid artery ulrasound on 10/13/2017 showed an occluded RICA with 32-67% LICA stenosis, follow up in 6 months was advised. Most recent LDL of 73 from 12/2016.   She comes in doing well today.  She has noted positional dizziness/lightheadedness over the past couple of days.  Home blood pressure readings have been in  the low 124P systolic.  She has self discontinued her morning dose of carvedilol the past 2 days.  She has continued to take carvedilol 25 mg in the evening as well as losartan 25 mg in the evening.  She notes when she does have this positional dizziness if she drinks some ice water her symptoms resolve.  Never with chest pain.  She does snack on a fair amount of candy during the day and drinks sugary drinks.  Her family feels like some of her prior weight gain may have been due to caloric intake in combination with fluid.  Her weight is down 5 pounds from her 09/2017 visit to today (196-->191 pounds).  She is tolerating both Eliquis and Plavix without issues.  No melena or BRBPR.  No falls.  Walking with a walker.  She is able to walk around [place] and shop without any issues.  She is followed by [facility] for pancreatic mass.  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   Past Medical History:  Diagnosis Date  . Breast cancer ([place])    Right  . Chronic combined systolic and diastolic CHF (congestive heart failure) ([place])    a. 02/2017 Echo: EF 35-40%, diff HK, mild LVH, mod AS, mild MR, sev dil LA, mildly dil RA, mod TR, PASP 67mmHg.  Marland Kitchen Coronary artery disease    a. 02/2017 NSTEMI/Abnl Nuc: inferolateral scar/ischemia; b. 80998 Cath/PCI: LM nl, LAD nl, D2 80ost/small, LCX small, RCA 14m (4.0x22 Resolute Onyx  DES), 51m/d.  Marland Kitchen Essential hypertension 01/16/2017  . Frequent headaches   . Hyperlipidemia 01/16/2017  . Hypothyroidism 01/16/2017  . Ischemic cardiomyopathy    a. 02/2017 Echo: EF 35-40%, diff HK.  . Kidney stones   . Lymphedema 01/16/2017  . Normocytic anemia    a. 03/2017 FOB neg.  . Persistent atrial fibrillation ([place])    a. Dx 12/2016->CHA2DS2VASc = 6-->Eliquis.  . Sleep apnea    a. Uses CPAP.    Past Surgical History:  Procedure Laterality Date  . ABDOMINAL HYSTERECTOMY    . BACK SURGERY    . BREAST  LUMPECTOMY Right   . CARDIAC CATHETERIZATION    . CHOLECYSTECTOMY    . CORONARY STENT INTERVENTION N/A 03/05/2017   Procedure: Coronary Stent Intervention;  Surgeon: Wellington Hampshire, MD;  Location: [place] [facility];  Service: Cardiovascular;  Laterality: N/A;  . LEFT HEART CATH AND CORONARY ANGIOGRAPHY N/A 03/05/2017   Procedure: Left Heart Cath and Coronary Angiography;  Surgeon: Minna Merritts, MD;  Location: [place] CV LAB;  Service: Cardiovascular;  Laterality: N/A;  . REPLACEMENT TOTAL KNEE    . TONSILLECTOMY      Current Meds  Medication Sig  . apixaban (ELIQUIS) 5 MG TABS tablet Take 1 tablet (5 mg total) by mouth 2 (two) times daily.  . brimonidine-timolol (COMBIGAN) 0.2-0.5 % ophthalmic solution Place 1 drop into both eyes every 12 (twelve) hours.  . Calcium Carb-Cholecalciferol (CALTRATE 600+D3 SOFT PO) Take by mouth 3 (three) times daily before meals.  . carvedilol (COREG) 25 MG tablet Take 1 tablet (25 mg total) by mouth 2 (two) times daily with a meal.  . clopidogrel (PLAVIX) 75 MG tablet TAKE 1 TABLET DAILY WITH BREAKFAST  . ferrous sulfate 325 (65 FE) MG EC tablet Take 1 tablet (325 mg total) by mouth 2 (two) times daily.  Marland Kitchen levothyroxine (SYNTHROID, LEVOTHROID) 125 MCG tablet Take 125 mcg by mouth daily.  Marland Kitchen losartan (COZAAR) 25 MG tablet TAKE 1 TABLET DAILY  . Multiple Vitamins-Minerals (EYE VITAMINS PO) Take by mouth.  . polyvinyl alcohol (LIQUIFILM TEARS) 1.4 % ophthalmic solution Place 2 drops into both eyes as needed for dry eyes.  . potassium chloride SA (K-DUR,KLOR-CON) 20 MEQ tablet Take 2 tablets (40 mEq total) by mouth 3 (three) times daily.  . simvastatin (ZOCOR) 40 MG tablet Take 40 mg by mouth daily.  Marland Kitchen torsemide (DEMADEX) 20 MG tablet Take 20mg  twice a day    Allergies:   Patient has no known allergies.   Social History:  The patient  reports that  has never smoked. she has never used smokeless tobacco. She reports that she does not drink  alcohol or use drugs.   Family History:  The patient's family history includes Breast cancer in her sister; Lung cancer in her sister.  ROS:   Review of Systems  Constitutional: Positive for malaise/fatigue. Negative for chills, diaphoresis, fever and weight loss.  HENT: Negative for congestion.   Eyes: Negative for discharge and redness.  Respiratory: Negative for cough, hemoptysis, sputum production, shortness of breath and wheezing.   Cardiovascular: Negative for chest pain, palpitations, orthopnea, claudication, leg swelling and PND.  Gastrointestinal: Negative for abdominal pain, blood in stool, heartburn, melena, nausea and vomiting.  Genitourinary: Negative for hematuria.  Musculoskeletal: Negative for falls and myalgias.  Skin: Negative for rash.  Neurological: Positive for dizziness and weakness. Negative for tingling, tremors, sensory change, speech change, focal weakness and loss of consciousness.  Endo/Heme/Allergies: Does not bruise/bleed  easily.  Psychiatric/Behavioral: Negative for substance abuse. The patient is not nervous/anxious.   All other systems reviewed and are negative.    PHYSICAL EXAM:  VS:  BP 139/72 (BP Location: Left Arm, Patient Position: Sitting, Cuff Size: Normal)   Pulse 82   Ht 5\' 7"  (1.702 m)   Wt 191 lb 4 oz (86.8 kg)   BMI 29.95 kg/m  BMI: Body mass index is 29.95 kg/m.  Physical Exam  Constitutional: She is oriented to person, place, and time. She appears well-developed and well-nourished.  HENT:  Head: Normocephalic and atraumatic.  Eyes: Right eye exhibits no discharge. Left eye exhibits no discharge.  Neck: Normal range of motion. No JVD present.  Cardiovascular: Normal rate, S1 normal and S2 normal. An irregularly irregular rhythm present. Exam reveals no distant heart sounds, no friction rub, no midsystolic click and no opening snap.  Murmur heard.  Harsh midsystolic murmur is present with a grade of 2/6 at the upper right sternal  border radiating to the neck. Pulses:      Posterior tibial pulses are 2+ on the right side, and 2+ on the left side.  Pulmonary/Chest: Effort normal and breath sounds normal. No respiratory distress. She has no decreased breath sounds. She has no wheezes. She has no rales. She exhibits no tenderness.  Abdominal: Soft. She exhibits no distension. There is no tenderness.  Musculoskeletal: She exhibits no edema.  Neurological: She is alert and oriented to person, place, and time.  Skin: Skin is warm and dry. No cyanosis. Nails show no clubbing.  Psychiatric: She has a normal mood and affect. Her speech is normal and behavior is normal. Judgment and thought content normal.     EKG:  Was ordered and interpreted by me today. Shows A. fib, 82 bpm, nonspecific ST-T changes  Orthostatic vital signs: Lying: 145/72, 51 bpm Sitting: 157/77, 70 bpm, lightheaded Standing: 130/80, 61 bpm Standing times 3 minutes: 133/60, 66 bpm  Recent Labs: 01/16/2017: TSH 2.66 03/11/2017: Magnesium 1.8 03/28/2017: B Natriuretic Peptide 1,557.0 03/31/2017: ALT 11 06/09/2017: Hemoglobin 10.8; Platelets 191 06/30/2017: BUN 26; Creatinine, Ser 1.32; Potassium 4.3; Sodium 144  01/16/2017: Cholesterol 136; HDL 37.60; LDL Cholesterol 73; Total CHOL/HDL Ratio 4; Triglycerides 129.0; VLDL 25.8   CrCl cannot be calculated (Patient's most recent lab result is older than the maximum 21 days allowed.).   Wt Readings from Last 3 Encounters:  12/24/17 191 lb 4 oz (86.8 kg)  10/02/17 196 lb (88.9 kg)  07/09/17 183 lb 2 oz (83.1 kg)     Other studies reviewed: Additional studies/records reviewed today include: summarized above  ASSESSMENT AND PLAN:  1. CAD involving native coronary arteries without angina: No symptoms concerning for chest pain at this time.  She will remain on Plavix through 02/2018.  After that time, she can likely discontinue Plavix though I will defer this decision to interventional cardiology.  Aggressive risk  factor modification and secondary prevention.  No plans for further ischemic evaluation at this time.  2. Chronic systolic CHF secondary to ischemic cardiomyopathy/pulmonary hypertension: She does not appear volume overloaded at this time.  Weight is down 5 pounds from 09/2017 visit.  For now, continue torsemide 20 mg twice daily.  Check BMP.  We will decrease her Coreg to 12.5 mg twice daily and losartan to 12.5 mg daily in the setting of her positive orthostatic vital signs and positional dizziness.  CHF education provided and discussed in detail.  3. Permanent A. Fib: Ventricular rate well controlled.  Coreg decreased to 12.5 mg twice daily given her positive orthostatic vital signs today.  Continue Eliquis 5 mg twice daily (she does not meet 2 of 3 reduced dosing criteria at this time).  Check bmet.  If serum creatinine is trending closer to 1.5 we may need to consider changing her from Eliquis to Xarelto based on creatinine clearance for simplicity sake.  Patient's family member would like to defer any possible change to different oral anticoagulation for several months given a refill was just submitted to [facility].  This will be for financial reasons.  4. Orthostatic hypotension: Positive as above.  Advised patient to increase water intake.  Decrease carvedilol and losartan as above.  Patient to check blood pressure readings over the next couple of weeks and give [place] a call with those readings.  5. Moderate aortic stenosis: Continue to monitor clinically and with periodic echocardiograms approximately every 12 months.  6. Hyperlipidemia: LDL of 73 from 12/2016.  Remains on simvastatin.  Schedule fasting lipid and liver function at follow-up.  7. Elevated blood pressure: Noted to be orthostatic as above.  Medication changes as above.  8. CKD stage II: Followed by PCP.  9. Anemia of chronic disease: Stable.  Followed by PCP.  Disposition: F/u with Dr. Fletcher Anon in 3 months.  Current  medicines are reviewed at length with the patient today.  The patient did not have any concerns regarding medicines.  Signed, Christell Faith, PA-C 12/24/2017 3:52 PM     [address] [place], [place] [address] [phone]

## 2017-12-24 ENCOUNTER — Encounter: Payer: Self-pay | Admitting: Physician Assistant

## 2017-12-24 ENCOUNTER — Ambulatory Visit (INDEPENDENT_AMBULATORY_CARE_PROVIDER_SITE_OTHER): Payer: MEDICARE | Admitting: Physician Assistant

## 2017-12-24 ENCOUNTER — Other Ambulatory Visit: Payer: Self-pay | Admitting: *Deleted

## 2017-12-24 ENCOUNTER — Other Ambulatory Visit
Admission: RE | Admit: 2017-12-24 | Discharge: 2017-12-24 | Disposition: A | Discharge status: Home / Self Care | Payer: MEDICARE | Source: Ambulatory Visit | Attending: Physician Assistant | Admitting: Physician Assistant

## 2017-12-24 VITALS — BP 139/72 | HR 82 | Ht 67.0 in | Wt 191.2 lb

## 2017-12-24 DIAGNOSIS — I951 Orthostatic hypotension: Secondary | ICD-10-CM

## 2017-12-24 DIAGNOSIS — I255 Ischemic cardiomyopathy: Secondary | ICD-10-CM

## 2017-12-24 DIAGNOSIS — E785 Hyperlipidemia, unspecified: Secondary | ICD-10-CM | POA: Diagnosis not present

## 2017-12-24 DIAGNOSIS — I5022 Chronic systolic (congestive) heart failure: Secondary | ICD-10-CM

## 2017-12-24 DIAGNOSIS — I10 Essential (primary) hypertension: Secondary | ICD-10-CM

## 2017-12-24 DIAGNOSIS — I482 Chronic atrial fibrillation, unspecified: Secondary | ICD-10-CM

## 2017-12-24 DIAGNOSIS — D638 Anemia in other chronic diseases classified elsewhere: Secondary | ICD-10-CM

## 2017-12-24 DIAGNOSIS — I350 Nonrheumatic aortic (valve) stenosis: Secondary | ICD-10-CM

## 2017-12-24 DIAGNOSIS — I2510 Atherosclerotic heart disease of native coronary artery without angina pectoris: Secondary | ICD-10-CM

## 2017-12-24 DIAGNOSIS — N182 Chronic kidney disease, stage 2 (mild): Secondary | ICD-10-CM | POA: Diagnosis not present

## 2017-12-24 LAB — BASIC METABOLIC PANEL
Anion gap: 8 (ref 5–15)
BUN: 24 mg/dL — ABNORMAL HIGH (ref 6–20)
CALCIUM: 9.8 mg/dL (ref 8.9–10.3)
CO2: 24 mmol/L (ref 22–32)
CREATININE: 1.25 mg/dL — AB (ref 0.44–1.00)
Chloride: 110 mmol/L (ref 101–111)
GFR, EST AFRICAN AMERICAN: 44 mL/min — AB (ref >60)
GFR, EST NON AFRICAN AMERICAN: 38 mL/min — AB (ref >60)
Glucose, Bld: 87 mg/dL (ref 65–99)
Potassium: 4 mmol/L (ref 3.5–5.1)
SODIUM: 142 mmol/L (ref 135–145)

## 2017-12-24 MED ORDER — CARVEDILOL 12.5 MG PO TABS: 12.5000 mg | ORAL_TABLET | Freq: Two times a day (BID) | ORAL | 3 refills | Status: AC

## 2017-12-24 MED ORDER — CARVEDILOL 12.5 MG PO TABS: 12.5000 mg | ORAL_TABLET | Freq: Two times a day (BID) | ORAL | 3 refills | Status: DC

## 2017-12-24 MED ORDER — LOSARTAN POTASSIUM 25 MG PO TABS: 12.5000 mg | ORAL_TABLET | Freq: Every day | ORAL | 3 refills | Status: AC

## 2017-12-24 MED ORDER — LOSARTAN POTASSIUM 25 MG PO TABS: 12.5000 mg | ORAL_TABLET | Freq: Every day | ORAL | 3 refills | Status: DC

## 2017-12-24 NOTE — Patient Instructions (Signed)
Medication Instructions:  Your physician has recommended you make the following change in your medication:  1- DECREASE Carvedilol to 12.5 mg by mouth two times a day. 2- DECREASE Losartan to 12.5 mg (1/2 tablet) by mouth once a day.   Labwork: Your physician recommends that you return for lab work in: [place]. -  Please go to the [facility]. You will check in at the front desk to the right as you walk into the atrium. Valet Parking is offered if needed.    Testing/Procedures: none  Follow-Up: Your physician recommends that you schedule a follow-up appointment in: [place].    PLEASE INCREASE YOUR INTAKE OF FLUIDS MAINLY WATER.   If you need a refill on your cardiac medications before your next appointment, please call your pharmacy.

## 2018-02-01 ENCOUNTER — Other Ambulatory Visit: Payer: Self-pay | Admitting: Cardiovascular Disease

## 2018-02-09 DIAGNOSIS — I482 Chronic atrial fibrillation: Secondary | ICD-10-CM | POA: Diagnosis not present

## 2018-02-09 DIAGNOSIS — I509 Heart failure, unspecified: Secondary | ICD-10-CM | POA: Diagnosis not present

## 2018-02-09 DIAGNOSIS — I10 Essential (primary) hypertension: Secondary | ICD-10-CM | POA: Diagnosis not present

## 2018-02-09 DIAGNOSIS — I110 Hypertensive heart disease with heart failure: Secondary | ICD-10-CM | POA: Diagnosis not present

## 2018-02-09 DIAGNOSIS — E7849 Other hyperlipidemia: Secondary | ICD-10-CM | POA: Diagnosis not present

## 2018-02-09 DIAGNOSIS — E039 Hypothyroidism, unspecified: Secondary | ICD-10-CM | POA: Diagnosis not present

## 2018-02-25 ENCOUNTER — Other Ambulatory Visit: Payer: Self-pay | Admitting: Cardiovascular Disease

## 2018-02-26 NOTE — Telephone Encounter (Signed)
Refill Request.  

## 2018-03-02 DIAGNOSIS — H6121 Impacted cerumen, right ear: Secondary | ICD-10-CM | POA: Diagnosis not present

## 2018-03-26 ENCOUNTER — Ambulatory Visit: Payer: MEDICARE | Admitting: Cardiovascular Disease

## 2018-04-16 ENCOUNTER — Ambulatory Visit (INDEPENDENT_AMBULATORY_CARE_PROVIDER_SITE_OTHER): Payer: MEDICARE

## 2018-04-16 ENCOUNTER — Other Ambulatory Visit: Payer: Self-pay | Admitting: Cardiovascular Disease

## 2018-04-16 ENCOUNTER — Telehealth: Payer: Self-pay | Admitting: Cardiovascular Disease

## 2018-04-16 DIAGNOSIS — I6523 Occlusion and stenosis of bilateral carotid arteries: Secondary | ICD-10-CM

## 2018-04-16 NOTE — Telephone Encounter (Signed)
I left a message for [name] to call back.

## 2018-04-16 NOTE — Telephone Encounter (Signed)
Patient granddaughter Stephanie Contreras wants to verify fluid pill dosage  Please call

## 2018-04-19 NOTE — Telephone Encounter (Signed)
I spoke with [name]. She states that she was speaking with the patient last week and the patient realized that what was on her printed medication list for torsemide was 20 mg - take 1 tablet by mouth twice daily. However, the bottle stated to take torsemide 20 mg 1 tablet by mouth three times a day. Per the patient, she has only been taking torsemide 20 mg twice daily and doing well with this.   I advised [name] that the last thing we sent in was for torsemide 20 mg BID to [facility] in [place] in 09/2017. Per [name], the RX they have came from [place].  The patient is coming for follow up with Dr. Fletcher Anon on Friday 04/23/18. I have advised [name] to have the patient bring in her current med list and the bottle for torsemide to her visit for review.  When can then fix the RX at [place] if Dr. Fletcher Anon does not change the RX.   [name] voices understanding.

## 2018-04-19 NOTE — Telephone Encounter (Signed)
I left a message for [name] to call back.

## 2018-04-22 ENCOUNTER — Telehealth: Payer: Self-pay | Admitting: Cardiovascular Disease

## 2018-04-22 DIAGNOSIS — R0989 Other specified symptoms and signs involving the circulatory and respiratory systems: Secondary | ICD-10-CM

## 2018-04-22 NOTE — Telephone Encounter (Signed)
Pt granddaughter returning our call for carotid results  Please call back

## 2018-04-22 NOTE — Telephone Encounter (Signed)
Spoke to patient's granddaughter. Result given . Verbalized understanding Order placed for annual follow up

## 2018-04-22 NOTE — Telephone Encounter (Signed)
-----   Message from Wellington Hampshire, MD sent at 04/20/2018  1:43 PM EDT ----- Inform patient that carotid Doppler showed stable disease.  Repeat study in 1 year.

## 2018-04-22 NOTE — Telephone Encounter (Signed)
New Message   Pt's granddaughter returning call for nurse regarding carotid results

## 2018-04-22 NOTE — Telephone Encounter (Signed)
Spoke with patients granddaughter per release form and advised that I do not see any results ready at this time. Let her know that we would certainly call with results once available. She verbalized understanding and had no further questions at this time.

## 2018-04-23 ENCOUNTER — Ambulatory Visit (INDEPENDENT_AMBULATORY_CARE_PROVIDER_SITE_OTHER): Payer: MEDICARE | Admitting: Cardiovascular Disease

## 2018-04-23 ENCOUNTER — Encounter: Payer: Self-pay | Admitting: Cardiovascular Disease

## 2018-04-23 VITALS — BP 100/60 | HR 68 | Ht 67.5 in | Wt 188.2 lb

## 2018-04-23 DIAGNOSIS — Z79899 Other long term (current) drug therapy: Secondary | ICD-10-CM | POA: Diagnosis not present

## 2018-04-23 DIAGNOSIS — I350 Nonrheumatic aortic (valve) stenosis: Secondary | ICD-10-CM | POA: Diagnosis not present

## 2018-04-23 DIAGNOSIS — E785 Hyperlipidemia, unspecified: Secondary | ICD-10-CM | POA: Diagnosis not present

## 2018-04-23 DIAGNOSIS — I255 Ischemic cardiomyopathy: Secondary | ICD-10-CM | POA: Diagnosis not present

## 2018-04-23 DIAGNOSIS — I5022 Chronic systolic (congestive) heart failure: Secondary | ICD-10-CM

## 2018-04-23 DIAGNOSIS — I482 Chronic atrial fibrillation, unspecified: Secondary | ICD-10-CM

## 2018-04-23 DIAGNOSIS — I739 Peripheral vascular disease, unspecified: Secondary | ICD-10-CM

## 2018-04-23 DIAGNOSIS — I779 Disorder of arteries and arterioles, unspecified: Secondary | ICD-10-CM | POA: Diagnosis not present

## 2018-04-23 MED ORDER — POTASSIUM CHLORIDE CRYS ER 20 MEQ PO TBCR: 40.0000 meq | EXTENDED_RELEASE_TABLET | Freq: Every day | ORAL | 1 refills | Status: DC

## 2018-04-23 MED ORDER — TORSEMIDE 20 MG PO TABS: 20.0000 mg | ORAL_TABLET | Freq: Every day | ORAL | 3 refills | Status: AC

## 2018-04-23 NOTE — Progress Notes (Signed)
Cardiology Office Note   Date:  04/23/2018   ID:  Stephanie Contreras, Alferd Apa 09/08/1929, MRN [id number]  PCP:  Coy Saunas, MD  Cardiologist:   Kathlyn Sacramento, MD   Chief Complaint  Patient presents with  . other    3 month follow up. Meds reviewed by the pt. verbally. Pt. c/o dizziness when BP decreased and has shortness of breath with over exertion.       History of Present Illness: Stephanie Contreras is a 84 y.o. female who presents for a follow-up visit regarding atrial fibrillation , CAD, moderate aortic stenosis and chronic systolic heart failure.  She has other chronic medical conditions that include history of breast cancer, hypothyroidism and hyperlipidemia. Echocardiogram in May 2018 showed an EF of 35-40%, moderate aortic stenosis, mild mitral regurgitation, moderate tricuspid regurgitation and moderate pulmonary hypertension. Nuclear stress test in May 2018 was high risk with inferior and inferolateral scar with peri-infarct ischemia with ejection fraction of less than 30%. Cardiac catheterization in May, 2018 showed 90% mid RCA stenosis, 60% distal RCA stenosis and no significant disease affecting the left system. I performed successful angioplasty and drug-eluting stent placement to the midright coronary artery. She did have volume overload after stopping furosemide.  She responded to torsemide better.  He also had progressive anemia after stenting.  Eliquis was held briefly and resumed again.  She was seen most recently by Thurmond Butts in February.  Blood pressure was low and the dose of losartan and carvedilol were both decreased.  She has been doing well with no recent chest pain.  She reports stable exertional dyspnea.  No leg edema.  Her weight is down 8 pounds since December and 3 pounds since February.  Past Medical History:  Diagnosis Date  . Breast cancer ([place])    Right  . Chronic combined systolic and diastolic CHF (congestive heart failure) ([place])    a. 02/2017 Echo: EF  35-40%, diff HK, mild LVH, mod AS, mild MR, sev dil LA, mildly dil RA, mod TR, PASP 97mmHg.  Marland Kitchen Coronary artery disease    a. 02/2017 NSTEMI/Abnl Nuc: inferolateral scar/ischemia; b. 22297 Cath/PCI: LM nl, LAD nl, D2 80ost/small, LCX small, RCA 57m (4.0x22 Resolute Onyx DES), 63m/d.  Marland Kitchen Essential hypertension 01/16/2017  . Frequent headaches   . Hyperlipidemia 01/16/2017  . Hypothyroidism 01/16/2017  . Ischemic cardiomyopathy    a. 02/2017 Echo: EF 35-40%, diff HK.  . Kidney stones   . Lymphedema 01/16/2017  . Normocytic anemia    a. 03/2017 FOB neg.  . Persistent atrial fibrillation ([place])    a. Dx 12/2016->CHA2DS2VASc = 6-->Eliquis.  . Sleep apnea    a. Uses CPAP.    Past Surgical History:  Procedure Laterality Date  . ABDOMINAL HYSTERECTOMY    . BACK SURGERY    . BREAST LUMPECTOMY Right   . CARDIAC CATHETERIZATION    . CHOLECYSTECTOMY    . CORONARY STENT INTERVENTION N/A 03/05/2017   Procedure: Coronary Stent Intervention;  Surgeon: Wellington Hampshire, MD;  Location: [facility];  Service: Cardiovascular;  Laterality: N/A;  . LEFT HEART CATH AND CORONARY ANGIOGRAPHY N/A 03/05/2017   Procedure: Left Heart Cath and Coronary Angiography;  Surgeon: Minna Merritts, MD;  Location: [facility];  Service: Cardiovascular;  Laterality: N/A;  . REPLACEMENT TOTAL KNEE    . TONSILLECTOMY       Current Outpatient Medications  Medication Sig Dispense Refill  . brimonidine-timolol (COMBIGAN) 0.2-0.5 % ophthalmic solution Place  1 drop into both eyes every 12 (twelve) hours.    . Calcium Carb-Cholecalciferol (CALTRATE 600+D3 SOFT PO) Take by mouth 3 (three) times daily before meals.    . carvedilol (COREG) 12.5 MG tablet Take 1 tablet (12.5 mg total) by mouth 2 (two) times daily. 180 tablet 3  . clopidogrel (PLAVIX) 75 MG tablet TAKE 1 TABLET DAILY WITH BREAKFAST 90 tablet 0  . ELIQUIS 5 MG TABS tablet TAKE 1 TABLET TWICE A DAY 180 tablet 2  . ferrous sulfate 325 (65 FE) MG EC  tablet Take 1 tablet (325 mg total) by mouth 2 (two) times daily. 60 tablet 3  . levothyroxine (SYNTHROID, LEVOTHROID) 125 MCG tablet Take 125 mcg by mouth daily.    Marland Kitchen losartan (COZAAR) 25 MG tablet Take 0.5 tablets (12.5 mg total) by mouth daily. 45 tablet 3  . Multiple Vitamins-Minerals (EYE VITAMINS PO) Take by mouth.    . polyvinyl alcohol (LIQUIFILM TEARS) 1.4 % ophthalmic solution Place 2 drops into both eyes as needed for dry eyes.    . potassium chloride SA (K-DUR,KLOR-CON) 20 MEQ tablet Take 2 tablets (40 mEq total) by mouth 3 (three) times daily. 540 tablet 1  . simvastatin (ZOCOR) 40 MG tablet Take 40 mg by mouth daily.    Marland Kitchen torsemide (DEMADEX) 20 MG tablet Take 20mg  twice a day 180 tablet 3   No current facility-administered medications for this visit.     Allergies:   Patient has no known allergies.    Social History:  The patient  reports that she has never smoked. She has never used smokeless tobacco. She reports that she does not drink alcohol or use drugs.   Family History:  The patient's family history includes Breast cancer in her sister; Lung cancer in her sister.    ROS:  Please see the history of present illness.   Otherwise, review of systems are positive for none.   All other systems are reviewed and negative.    PHYSICAL EXAM: VS:  BP 100/60 (BP Location: Left Arm, Patient Position: Sitting, Cuff Size: Normal)   Pulse 68   Ht 5' 7.5" (1.715 m)   Wt 188 lb 4 oz (85.4 kg)   BMI 29.05 kg/m  , BMI Body mass index is 29.05 kg/m. GEN: Well nourished, well developed, in no acute distress  HEENT: normal  Neck: Mild JVD, no  or masses.  Right carotid bruit Cardiac:Irregularly irregular; no  rubs, or gallops. 2/6 systolic ejection murmur at the aortic area. She has moderate bilateral leg edema worse on the left side Respiratory: Clear to auscultation, normal work of breathing GI: soft, nontender, nondistended, + BS MS: no deformity or atrophy  Skin: warm and dry,  no rash Neuro:  Strength and sensation are intact Psych: euthymic mood, full affect   EKG:  EKG is ordered today. The ekg ordered today demonstrates atrial fibrillation with ventricular rate of 68 bpm.  Nonspecific T wave changes.  Minimal LVH.   Recent Labs: 06/09/2017: Hemoglobin 10.8; Platelets 191 12/24/2017: BUN 24; Creatinine, Ser 1.25; Potassium 4.0; Sodium 142    Lipid Panel    Component Value Date/Time   CHOL 136 01/16/2017 1116   TRIG 129.0 01/16/2017 1116   HDL 37.60 (L) 01/16/2017 1116   CHOLHDL 4 01/16/2017 1116   VLDL 25.8 01/16/2017 1116   LDLCALC 73 01/16/2017 1116      Wt Readings from Last 3 Encounters:  04/23/18 188 lb 4 oz (85.4 kg)  12/24/17 191 lb 4  oz (86.8 kg)  10/02/17 196 lb (88.9 kg)      PAD Screen 02/26/2017  Previous PAD dx? No  Previous surgical procedure? No  Pain with walking? No  Feet/toe relief with dangling? No  Painful, non-healing ulcers? No  Extremities discolored? No      ASSESSMENT AND PLAN:  1.  Chronic systolic heart failure: She seems to be euvolemic.  I elected to decrease torsemide to 20 mg daily decrease potassium to 40 mEq once daily.  Check CBC and basic metabolic profile in 1 week.  Continue treatment with carvedilol and losartan.  2. Coronary artery disease involving native coronary arteries without angina: It has been 1 year since drug-eluting stent placement.  Given that she is on Eliquis and in order to decrease risk of bleeding, I discontinued Plavix today.  3.  Chronic  atrial fibrillation: Ventricular rate is controlled on current dose of carvedilol.  She is tolerating anticoagulation with Eliquis.  I ordered CBC  4. Moderate aortic stenosis: Continue to monitor.  Recommend repeat echocardiogram later this year  5.  Bilateral carotid disease: This was detected after finding right carotid bruit.  Right carotid artery is occluded with moderate disease affecting the left side.  Repeat study in 1 year.    6.   Hyperlipidemia: Currently on simvastatin.  Most recent LDL was 69.    Disposition:   FU with me in 3 months  Signed,  Kathlyn Sacramento, MD  04/23/2018 2:09 PM    [place]

## 2018-04-23 NOTE — Patient Instructions (Addendum)
Medication Instructions: DECREASE the Torsemide to 20 mg daily DECREASE the Potassium to 40 mEq daily STOP the Plavix  If you need a refill on your cardiac medications before your next appointment, please call your pharmacy.   Labwork: Your provider would like for you to return in one week to have the following labs drawn: BMET and CBC. Please go to the [facility] entrance and check in at the front desk. You do not need an appointment.   Follow-Up: Your physician wants you to follow-up in 3 months with Dr. Fletcher Anon.  Thank you for choosing Heart[facility] at [facility]!

## 2018-04-26 DIAGNOSIS — H401131 Primary open-angle glaucoma, bilateral, mild stage: Secondary | ICD-10-CM | POA: Diagnosis not present

## 2018-04-26 DIAGNOSIS — H353131 Nonexudative age-related macular degeneration, bilateral, early dry stage: Secondary | ICD-10-CM | POA: Diagnosis not present

## 2018-04-26 DIAGNOSIS — Z0000 Encounter for general adult medical examination without abnormal findings: Secondary | ICD-10-CM | POA: Diagnosis not present

## 2018-04-26 DIAGNOSIS — Z7189 Other specified counseling: Secondary | ICD-10-CM | POA: Diagnosis not present

## 2018-04-27 DIAGNOSIS — L989 Disorder of the skin and subcutaneous tissue, unspecified: Secondary | ICD-10-CM | POA: Diagnosis not present

## 2018-04-30 ENCOUNTER — Telehealth: Payer: Self-pay | Admitting: Cardiovascular Disease

## 2018-04-30 DIAGNOSIS — I482 Chronic atrial fibrillation: Secondary | ICD-10-CM | POA: Diagnosis not present

## 2018-04-30 DIAGNOSIS — I5022 Chronic systolic (congestive) heart failure: Secondary | ICD-10-CM | POA: Diagnosis not present

## 2018-04-30 DIAGNOSIS — Z79899 Other long term (current) drug therapy: Secondary | ICD-10-CM | POA: Diagnosis not present

## 2018-04-30 NOTE — Telephone Encounter (Signed)
Dr. Fletcher Anon,  Any changes to potassium dose?

## 2018-04-30 NOTE — Telephone Encounter (Signed)
Pt granddaughter calling stating they were on the way to [facility]  They do not have the orders for labs to be drawn there  Would like [place] to please send them to [phone]   Also would like [place] to please be aware patient had some fluid rentention She had the patient record the weights, and noticed the weight going up after we changed the does on Torsemide   04/24/18 185 6/30/0 188 04/26/18 188 04/27/18 188 04/28/18 190  Took an extra pill of Torsemide for the weight gain

## 2018-04-30 NOTE — Telephone Encounter (Signed)
Lab orders for BMP/ CBC faxed to [phone]. Confirmation received.   To Dr. Fletcher Anon to review weights.

## 2018-04-30 NOTE — Telephone Encounter (Signed)
Weight is gradually increasing.  We should go back on torsemide 40 mg once daily instead of 20 mg.

## 2018-05-01 NOTE — Telephone Encounter (Signed)
I want to see her BMP before increasing the potassium again.

## 2018-05-04 DIAGNOSIS — L821 Other seborrheic keratosis: Secondary | ICD-10-CM | POA: Diagnosis not present

## 2018-05-04 DIAGNOSIS — D224 Melanocytic nevi of scalp and neck: Secondary | ICD-10-CM | POA: Diagnosis not present

## 2018-05-04 DIAGNOSIS — C4441 Basal cell carcinoma of skin of scalp and neck: Secondary | ICD-10-CM | POA: Diagnosis not present

## 2018-05-04 NOTE — Telephone Encounter (Signed)
No lab results received from [facility] yet. I called [name] to find out what she could see on MyChart. Per [name], only the CBC is resulted. Hgb- 12.3  Plt- 187  I advised [name] I will follow back up with [facility] tomorrow to find out the results. [name] confirmed 2 tubes of blood were drawn.

## 2018-05-04 NOTE — Telephone Encounter (Signed)
I called and spoke with the patient's grand daughter, [name] (ok per [facility]) and advised her of Dr. Tyrell Antonio recommendations to increase torsemide to 40 mg once daily. Per [name], the patient has already increased the dose due to swelling that she was having.  I inquired if she did have her labs drawn at [facility].  Per [name], the patient has labs drawn on Friday and they have resulted to the patient MyChart for [facility] system. I advised [name] that I cannot see these in [place] and that I would contact [facility] to get the results.  I called [facility] [phone] and spoke with medical records. They required a fax be sent on letter head requesting the results. Fax sent to [phone]. Confirmation received.

## 2018-05-07 DIAGNOSIS — C4449 Other specified malignant neoplasm of skin of scalp and neck: Secondary | ICD-10-CM | POA: Diagnosis not present

## 2018-05-07 NOTE — Telephone Encounter (Signed)
A copy of the patient's lab work was received from [facility].   Na- 142 K- 3.4 BUN- 18 Creatinine- 1.10  To Dr. Fletcher Anon to review.

## 2018-05-10 MED ORDER — POTASSIUM CHLORIDE CRYS ER 20 MEQ PO TBCR: EXTENDED_RELEASE_TABLET | ORAL | Status: DC

## 2018-05-10 NOTE — Telephone Encounter (Signed)
Patient granddaughter returning call.  Reception is bad so she may miss the call again.  Please call again if can leave a detailed msg with instructions and she will check voicemail.

## 2018-05-10 NOTE — Telephone Encounter (Signed)
I spoke with [name] and advised her to have the patient increase potassium to 40 meq BID. She voices understanding.

## 2018-05-10 NOTE — Telephone Encounter (Signed)
No answer. Left message to call back.   

## 2018-05-10 NOTE — Telephone Encounter (Signed)
Increase potassium back to 40 mg bid.

## 2018-05-18 ENCOUNTER — Other Ambulatory Visit: Payer: Self-pay

## 2018-05-18 ENCOUNTER — Telehealth: Payer: Self-pay | Admitting: Cardiovascular Disease

## 2018-05-18 MED ORDER — POTASSIUM CHLORIDE CRYS ER 20 MEQ PO TBCR: EXTENDED_RELEASE_TABLET | ORAL | 0 refills | Status: AC

## 2018-05-18 NOTE — Telephone Encounter (Signed)
°*  STAT* If patient is at the pharmacy, call can be transferred to refill team.   1. Which medications need to be refilled? (please list name of each medication and dose if known) Potassium 40 MEQ 2 times a day   2. Which pharmacy/location (including street and city if local pharmacy) is [facility]   3. Do they need a 30 day or 90 day supply? 90 day

## 2018-05-18 NOTE — Telephone Encounter (Signed)
potassium chloride SA (K-DUR,KLOR-CON) 20 MEQ tablet 360 tablet 0 05/18/2018    Sig: Take 2 tablets (40 meq) by mouth twice daily   Sent to pharmacy as: potassium chloride SA (K-DUR,KLOR-CON) 20 MEQ tablet   E-Prescribing Status: Receipt confirmed by pharmacy (05/18/2018 11:39 AM EDT)   Pharmacy   [facility] [place], [place]

## 2018-05-20 DIAGNOSIS — R5383 Other fatigue: Secondary | ICD-10-CM | POA: Diagnosis not present

## 2018-05-20 DIAGNOSIS — Z853 Personal history of malignant neoplasm of breast: Secondary | ICD-10-CM | POA: Diagnosis not present

## 2018-05-20 DIAGNOSIS — C50911 Malignant neoplasm of unspecified site of right female breast: Secondary | ICD-10-CM | POA: Diagnosis not present

## 2018-05-20 DIAGNOSIS — C4440 Unspecified malignant neoplasm of skin of scalp and neck: Secondary | ICD-10-CM | POA: Diagnosis not present

## 2018-05-20 DIAGNOSIS — M2550 Pain in unspecified joint: Secondary | ICD-10-CM | POA: Diagnosis not present

## 2018-05-20 DIAGNOSIS — C50919 Malignant neoplasm of unspecified site of unspecified female breast: Secondary | ICD-10-CM | POA: Diagnosis not present

## 2018-05-20 DIAGNOSIS — C4449 Other specified malignant neoplasm of skin of scalp and neck: Secondary | ICD-10-CM | POA: Diagnosis not present

## 2018-05-20 DIAGNOSIS — Z170 Estrogen receptor positive status [ER+]: Secondary | ICD-10-CM | POA: Diagnosis not present

## 2018-05-24 DIAGNOSIS — C50911 Malignant neoplasm of unspecified site of right female breast: Secondary | ICD-10-CM | POA: Diagnosis not present

## 2018-05-24 DIAGNOSIS — Z170 Estrogen receptor positive status [ER+]: Secondary | ICD-10-CM | POA: Diagnosis not present

## 2018-05-24 DIAGNOSIS — C50811 Malignant neoplasm of overlapping sites of right female breast: Secondary | ICD-10-CM | POA: Diagnosis not present

## 2018-05-24 DIAGNOSIS — R220 Localized swelling, mass and lump, head: Secondary | ICD-10-CM | POA: Diagnosis not present

## 2018-05-31 DIAGNOSIS — H3589 Other specified retinal disorders: Secondary | ICD-10-CM | POA: Diagnosis not present

## 2018-05-31 DIAGNOSIS — H409 Unspecified glaucoma: Secondary | ICD-10-CM | POA: Diagnosis not present

## 2018-05-31 DIAGNOSIS — H43813 Vitreous degeneration, bilateral: Secondary | ICD-10-CM | POA: Diagnosis not present

## 2018-05-31 DIAGNOSIS — Z961 Presence of intraocular lens: Secondary | ICD-10-CM | POA: Diagnosis not present

## 2018-06-10 DIAGNOSIS — N6459 Other signs and symptoms in breast: Secondary | ICD-10-CM | POA: Diagnosis not present

## 2018-06-10 DIAGNOSIS — Z872 Personal history of diseases of the skin and subcutaneous tissue: Secondary | ICD-10-CM | POA: Diagnosis not present

## 2018-06-10 DIAGNOSIS — R5383 Other fatigue: Secondary | ICD-10-CM | POA: Diagnosis not present

## 2018-06-10 DIAGNOSIS — C50919 Malignant neoplasm of unspecified site of unspecified female breast: Secondary | ICD-10-CM | POA: Diagnosis not present

## 2018-06-10 DIAGNOSIS — R9389 Abnormal findings on diagnostic imaging of other specified body structures: Secondary | ICD-10-CM | POA: Diagnosis not present

## 2018-06-10 DIAGNOSIS — Z170 Estrogen receptor positive status [ER+]: Secondary | ICD-10-CM | POA: Diagnosis not present

## 2018-06-10 DIAGNOSIS — R0609 Other forms of dyspnea: Secondary | ICD-10-CM | POA: Diagnosis not present

## 2018-06-10 DIAGNOSIS — R928 Other abnormal and inconclusive findings on diagnostic imaging of breast: Secondary | ICD-10-CM | POA: Diagnosis not present

## 2018-06-10 DIAGNOSIS — C50911 Malignant neoplasm of unspecified site of right female breast: Secondary | ICD-10-CM | POA: Diagnosis not present

## 2018-06-10 DIAGNOSIS — G4700 Insomnia, unspecified: Secondary | ICD-10-CM | POA: Diagnosis not present

## 2018-06-10 DIAGNOSIS — Z853 Personal history of malignant neoplasm of breast: Secondary | ICD-10-CM | POA: Diagnosis not present

## 2018-06-10 DIAGNOSIS — N6310 Unspecified lump in the right breast, unspecified quadrant: Secondary | ICD-10-CM | POA: Diagnosis not present

## 2018-06-23 DIAGNOSIS — N6313 Unspecified lump in the right breast, lower outer quadrant: Secondary | ICD-10-CM | POA: Diagnosis not present

## 2018-06-23 DIAGNOSIS — Z79899 Other long term (current) drug therapy: Secondary | ICD-10-CM | POA: Diagnosis not present

## 2018-06-23 DIAGNOSIS — H401123 Primary open-angle glaucoma, left eye, severe stage: Secondary | ICD-10-CM | POA: Diagnosis not present

## 2018-06-23 DIAGNOSIS — H401112 Primary open-angle glaucoma, right eye, moderate stage: Secondary | ICD-10-CM | POA: Diagnosis not present

## 2018-06-23 DIAGNOSIS — Z7901 Long term (current) use of anticoagulants: Secondary | ICD-10-CM | POA: Diagnosis not present

## 2018-06-23 DIAGNOSIS — I509 Heart failure, unspecified: Secondary | ICD-10-CM | POA: Diagnosis not present

## 2018-06-23 DIAGNOSIS — Z961 Presence of intraocular lens: Secondary | ICD-10-CM | POA: Diagnosis not present

## 2018-06-23 DIAGNOSIS — I110 Hypertensive heart disease with heart failure: Secondary | ICD-10-CM | POA: Diagnosis not present

## 2018-06-23 DIAGNOSIS — E785 Hyperlipidemia, unspecified: Secondary | ICD-10-CM | POA: Diagnosis not present

## 2018-06-23 DIAGNOSIS — N641 Fat necrosis of breast: Secondary | ICD-10-CM | POA: Diagnosis not present

## 2018-06-23 DIAGNOSIS — E039 Hypothyroidism, unspecified: Secondary | ICD-10-CM | POA: Diagnosis not present

## 2018-06-23 DIAGNOSIS — Z853 Personal history of malignant neoplasm of breast: Secondary | ICD-10-CM | POA: Diagnosis not present

## 2018-06-23 DIAGNOSIS — C792 Secondary malignant neoplasm of skin: Secondary | ICD-10-CM | POA: Diagnosis not present

## 2018-06-23 DIAGNOSIS — N6489 Other specified disorders of breast: Secondary | ICD-10-CM | POA: Diagnosis not present

## 2018-06-23 DIAGNOSIS — I4891 Unspecified atrial fibrillation: Secondary | ICD-10-CM | POA: Diagnosis not present

## 2018-06-23 DIAGNOSIS — H53002 Unspecified amblyopia, left eye: Secondary | ICD-10-CM | POA: Diagnosis not present

## 2018-06-29 IMAGING — CR DG CHEST 2V
2 series · 2 of 2 positions shown · non-contrast
Comparison: 02/24/2017

CLINICAL DATA: Shortness of breath

EXAM:
CHEST  2 VIEW

[chest pa]
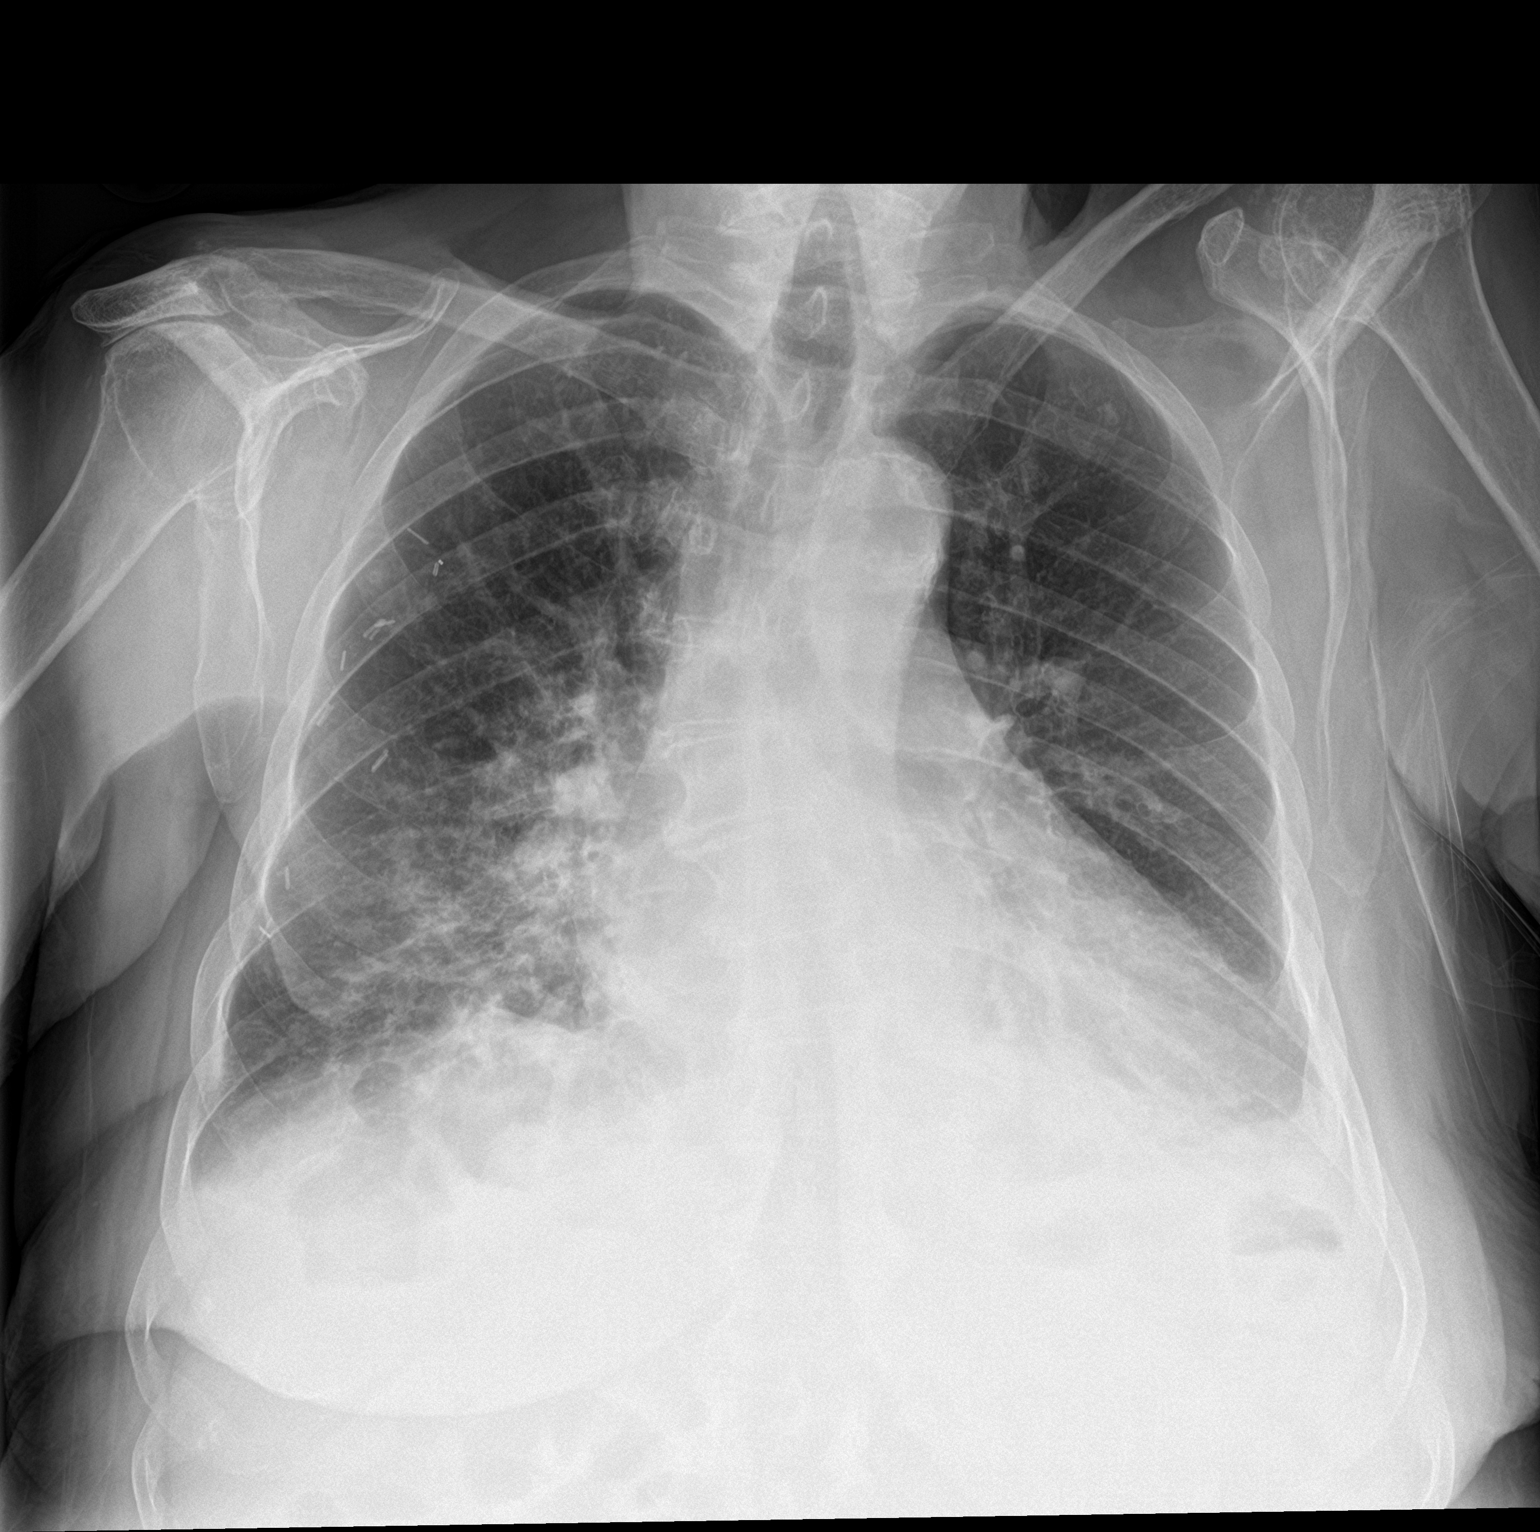

[chest lat]
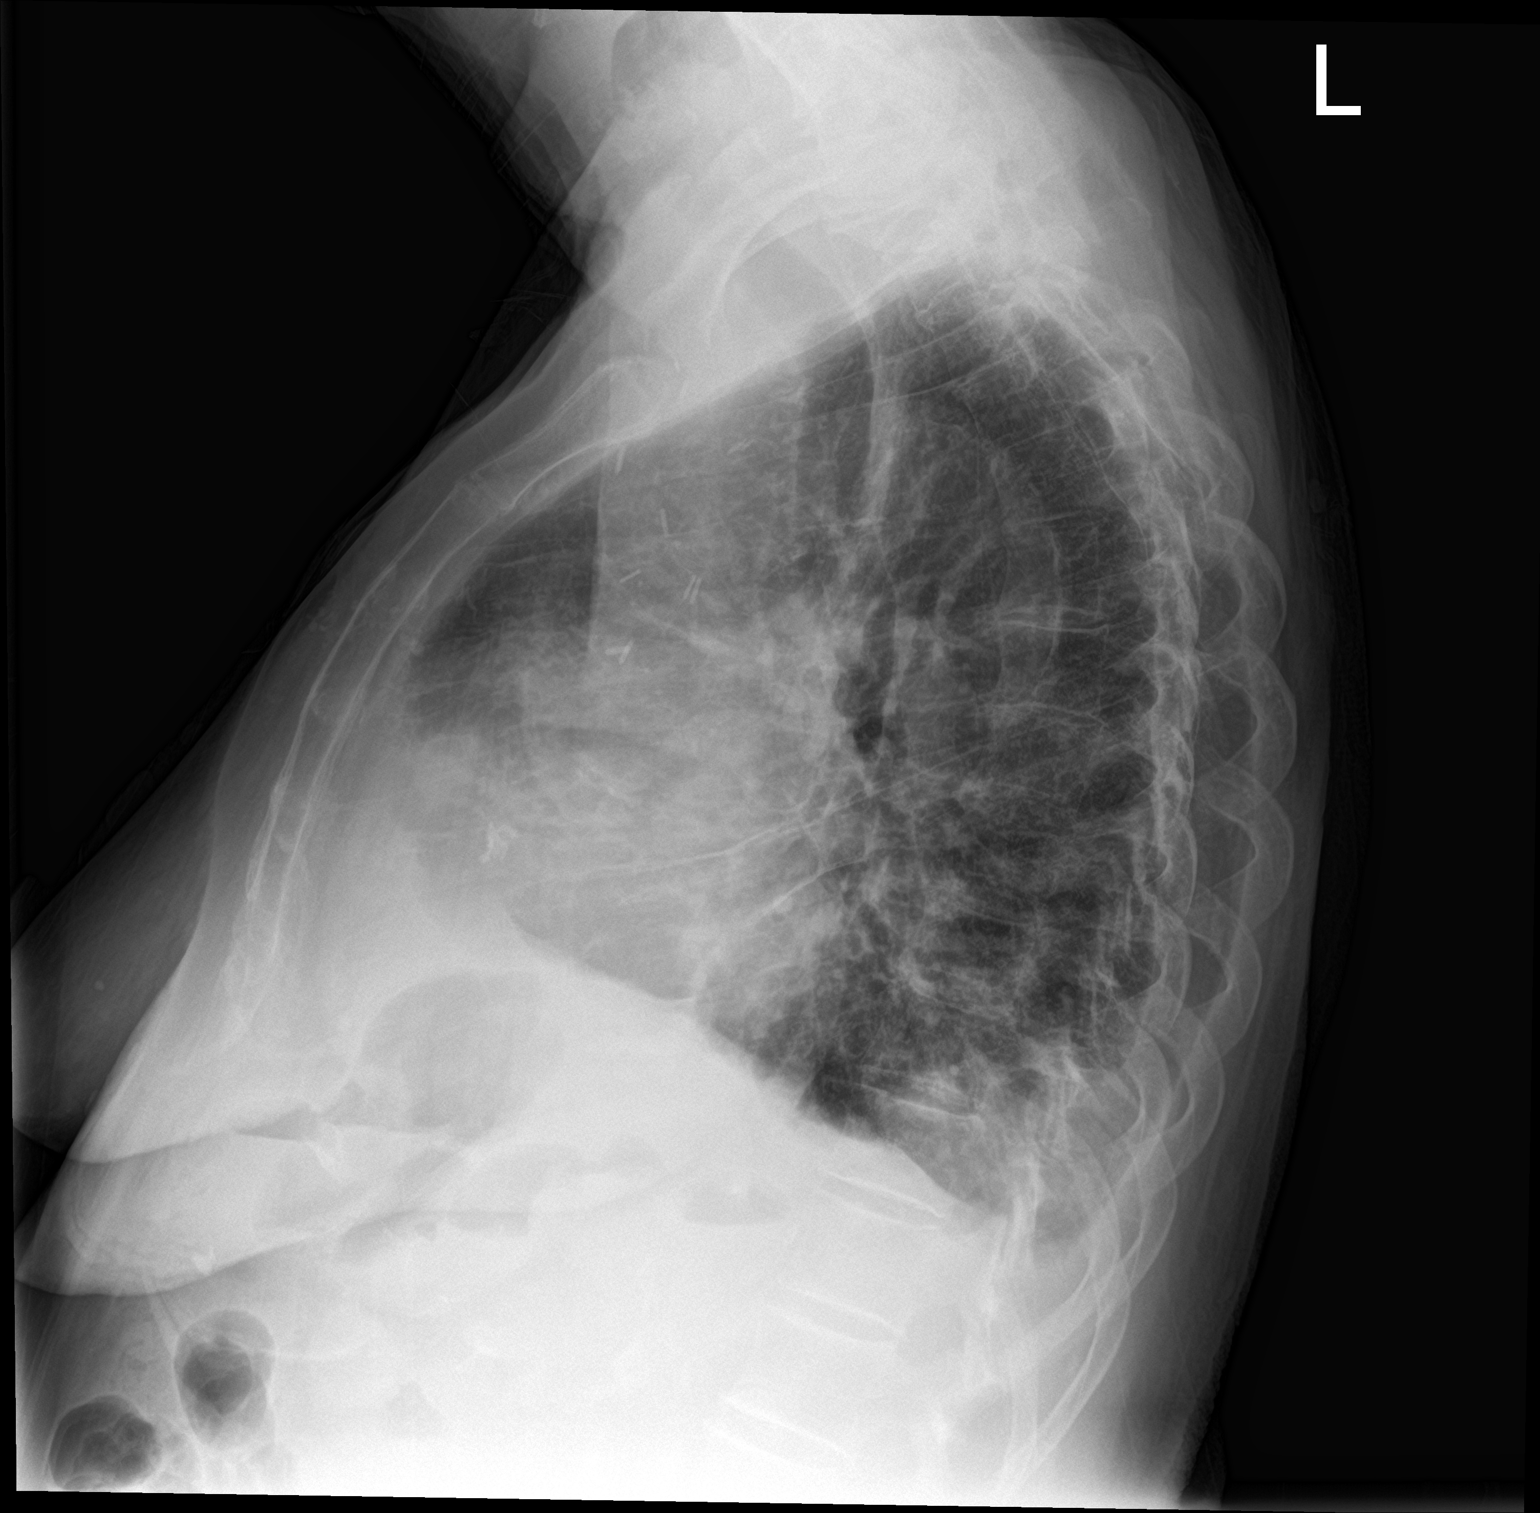

[2 of 2 positions shown; findings below may reference images not displayed]

FINDINGS: Mild cardiac enlargement. Aortic atherosclerosis noted. Bilateral
pleural effusions are noted. Bilateral lower lobe airspace opacities
are again noted compatible with multifocal pneumonia.
IMPRESSION: 1. Persistent bilateral airspace opacities compatible with
multifocal pneumonia.
2. Bilateral pleural effusions

## 2018-07-07 DIAGNOSIS — Z7901 Long term (current) use of anticoagulants: Secondary | ICD-10-CM | POA: Diagnosis not present

## 2018-07-07 DIAGNOSIS — C801 Malignant (primary) neoplasm, unspecified: Secondary | ICD-10-CM | POA: Diagnosis not present

## 2018-07-15 DIAGNOSIS — C4449 Other specified malignant neoplasm of skin of scalp and neck: Secondary | ICD-10-CM | POA: Diagnosis not present

## 2018-07-15 DIAGNOSIS — C434 Malignant melanoma of scalp and neck: Secondary | ICD-10-CM | POA: Diagnosis not present

## 2018-07-15 DIAGNOSIS — L821 Other seborrheic keratosis: Secondary | ICD-10-CM | POA: Diagnosis not present

## 2018-07-27 DIAGNOSIS — C801 Malignant (primary) neoplasm, unspecified: Secondary | ICD-10-CM | POA: Diagnosis not present

## 2018-07-27 DIAGNOSIS — Z09 Encounter for follow-up examination after completed treatment for conditions other than malignant neoplasm: Secondary | ICD-10-CM | POA: Diagnosis not present

## 2018-07-28 IMAGING — DX DG CHEST 1V PORT
1 series · 1 of 1 positions shown · non-contrast
Comparison: Chest radiograph performed 02/27/2017

CLINICAL DATA: Acute onset of shortness of breath and generalized
chest tightness. Initial encounter.

EXAM:
PORTABLE CHEST 1 VIEW

[chest ap]
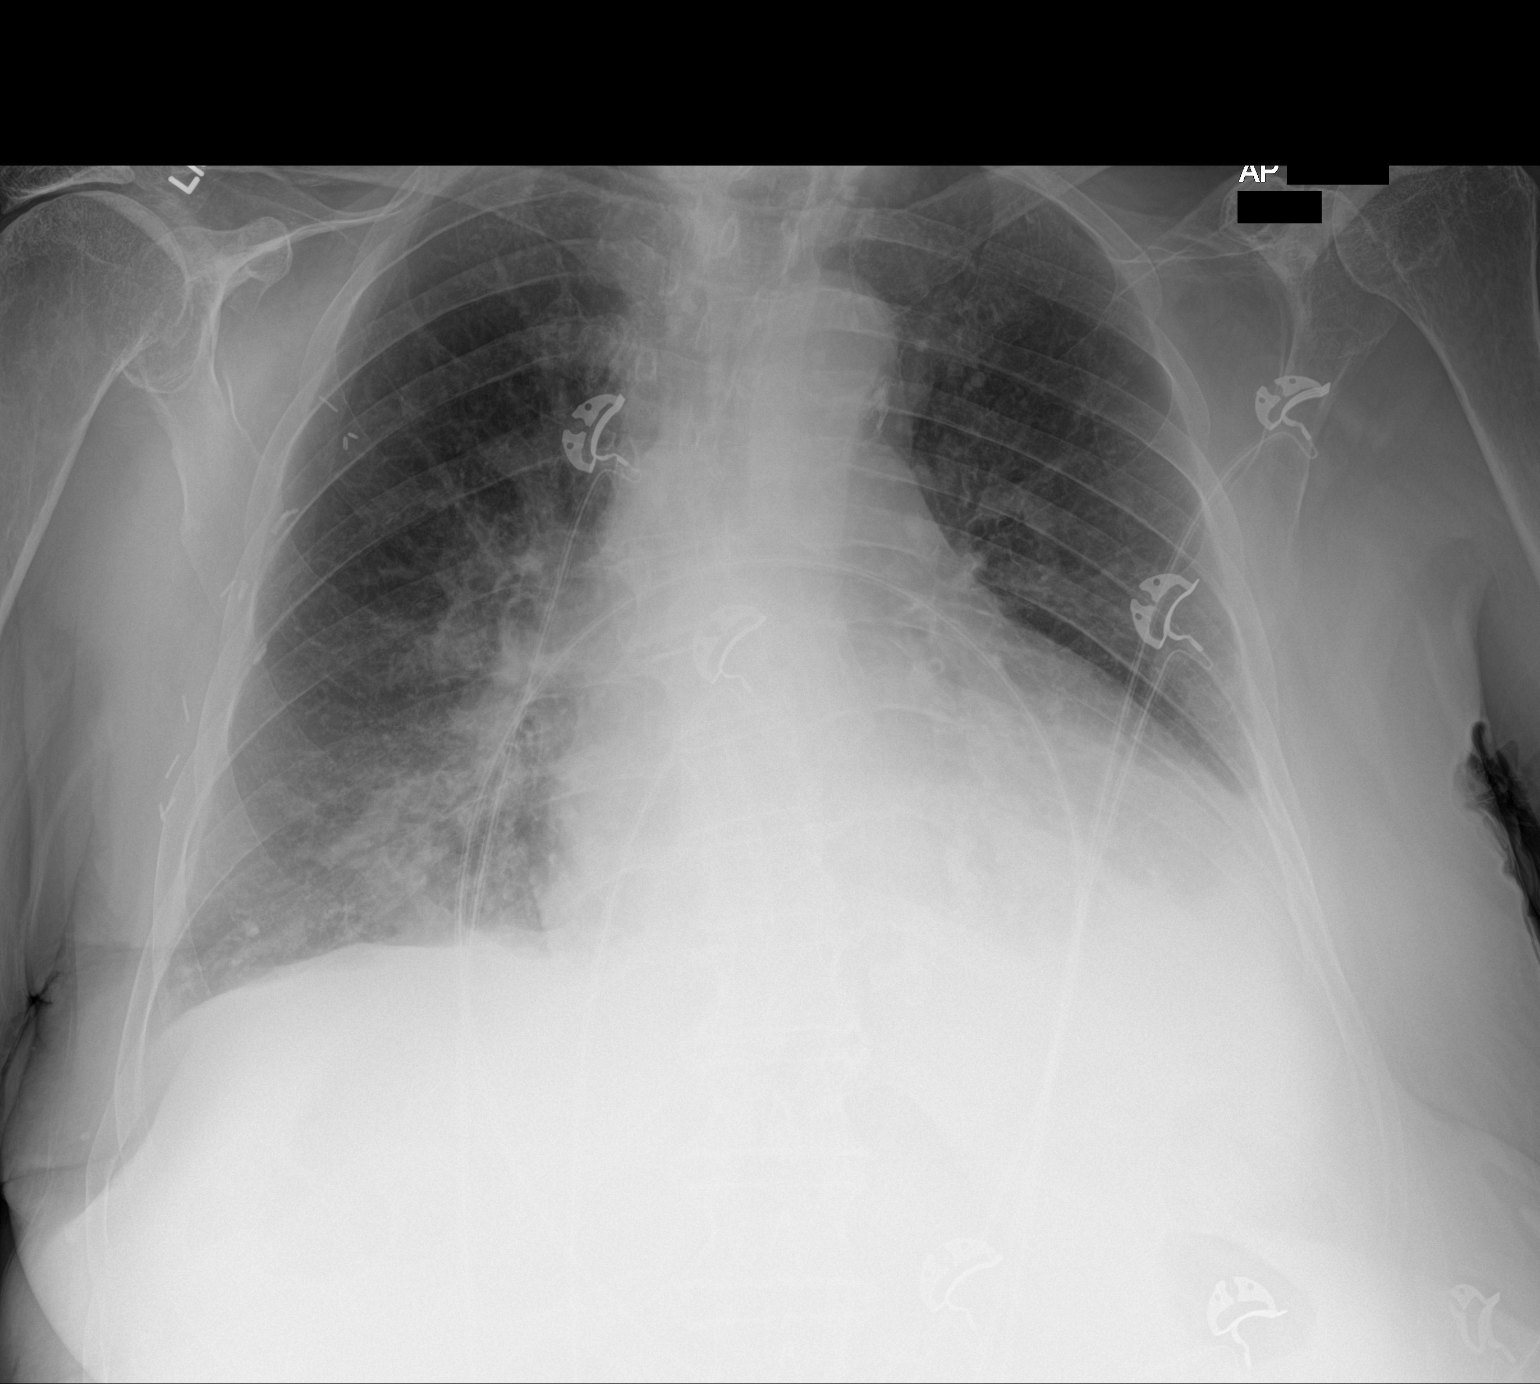

[1 of 1 positions shown; findings below may reference images not displayed]

FINDINGS: The lungs are well-aerated. Left basilar airspace opacity is noted,
raising concern for pneumonia, though mild interstitial edema cannot
be excluded. A small left pleural effusion is noted. Vascular
congestion is seen. No pneumothorax is seen.

The cardiomediastinal silhouette is enlarged. No acute osseous
abnormalities are seen. Postoperative change is noted about the
right axilla.
IMPRESSION: Small left pleural effusion. Vascular congestion and cardiomegaly.
Left basilar airspace opacity raises concern for pneumonia, though
mild interstitial edema cannot be excluded.

## 2018-08-06 DIAGNOSIS — Z23 Encounter for immunization: Secondary | ICD-10-CM | POA: Diagnosis not present

## 2018-08-06 DIAGNOSIS — I10 Essential (primary) hypertension: Secondary | ICD-10-CM | POA: Diagnosis not present

## 2018-08-06 DIAGNOSIS — I350 Nonrheumatic aortic (valve) stenosis: Secondary | ICD-10-CM | POA: Diagnosis not present

## 2018-08-06 DIAGNOSIS — I255 Ischemic cardiomyopathy: Secondary | ICD-10-CM | POA: Diagnosis not present

## 2018-08-06 DIAGNOSIS — I4819 Other persistent atrial fibrillation: Secondary | ICD-10-CM | POA: Diagnosis not present

## 2018-08-06 DIAGNOSIS — I2510 Atherosclerotic heart disease of native coronary artery without angina pectoris: Secondary | ICD-10-CM | POA: Diagnosis not present

## 2018-08-11 DIAGNOSIS — C801 Malignant (primary) neoplasm, unspecified: Secondary | ICD-10-CM | POA: Diagnosis not present

## 2018-08-11 DIAGNOSIS — Z961 Presence of intraocular lens: Secondary | ICD-10-CM | POA: Diagnosis not present

## 2018-08-11 DIAGNOSIS — H401133 Primary open-angle glaucoma, bilateral, severe stage: Secondary | ICD-10-CM | POA: Diagnosis not present

## 2018-08-11 DIAGNOSIS — Z09 Encounter for follow-up examination after completed treatment for conditions other than malignant neoplasm: Secondary | ICD-10-CM | POA: Diagnosis not present

## 2018-08-11 DIAGNOSIS — H53002 Unspecified amblyopia, left eye: Secondary | ICD-10-CM | POA: Diagnosis not present

## 2018-09-14 DIAGNOSIS — I509 Heart failure, unspecified: Secondary | ICD-10-CM | POA: Diagnosis not present

## 2018-09-14 DIAGNOSIS — I482 Chronic atrial fibrillation, unspecified: Secondary | ICD-10-CM | POA: Diagnosis not present

## 2018-09-14 DIAGNOSIS — G4733 Obstructive sleep apnea (adult) (pediatric): Secondary | ICD-10-CM | POA: Diagnosis not present

## 2018-09-14 DIAGNOSIS — E7849 Other hyperlipidemia: Secondary | ICD-10-CM | POA: Diagnosis not present

## 2018-09-14 DIAGNOSIS — Z9989 Dependence on other enabling machines and devices: Secondary | ICD-10-CM | POA: Diagnosis not present

## 2018-09-14 DIAGNOSIS — Z23 Encounter for immunization: Secondary | ICD-10-CM | POA: Diagnosis not present

## 2018-09-14 DIAGNOSIS — E039 Hypothyroidism, unspecified: Secondary | ICD-10-CM | POA: Diagnosis not present

## 2018-09-14 DIAGNOSIS — I10 Essential (primary) hypertension: Secondary | ICD-10-CM | POA: Diagnosis not present

## 2018-10-26 DIAGNOSIS — K862 Cyst of pancreas: Secondary | ICD-10-CM | POA: Diagnosis not present

## 2018-12-17 DIAGNOSIS — H401123 Primary open-angle glaucoma, left eye, severe stage: Secondary | ICD-10-CM | POA: Diagnosis not present

## 2018-12-17 DIAGNOSIS — H401112 Primary open-angle glaucoma, right eye, moderate stage: Secondary | ICD-10-CM | POA: Diagnosis not present

## 2019-04-28 DIAGNOSIS — H401131 Primary open-angle glaucoma, bilateral, mild stage: Secondary | ICD-10-CM | POA: Diagnosis not present

## 2019-05-04 DIAGNOSIS — I6523 Occlusion and stenosis of bilateral carotid arteries: Secondary | ICD-10-CM | POA: Diagnosis not present

## 2019-05-27 DIAGNOSIS — I482 Chronic atrial fibrillation, unspecified: Secondary | ICD-10-CM | POA: Diagnosis not present

## 2019-05-27 DIAGNOSIS — G4733 Obstructive sleep apnea (adult) (pediatric): Secondary | ICD-10-CM | POA: Diagnosis not present

## 2019-05-27 DIAGNOSIS — I509 Heart failure, unspecified: Secondary | ICD-10-CM | POA: Diagnosis not present

## 2019-05-27 DIAGNOSIS — I10 Essential (primary) hypertension: Secondary | ICD-10-CM | POA: Diagnosis not present

## 2019-05-27 DIAGNOSIS — E039 Hypothyroidism, unspecified: Secondary | ICD-10-CM | POA: Diagnosis not present

## 2019-05-27 DIAGNOSIS — E7849 Other hyperlipidemia: Secondary | ICD-10-CM | POA: Diagnosis not present

## 2019-05-27 DIAGNOSIS — Z9989 Dependence on other enabling machines and devices: Secondary | ICD-10-CM | POA: Diagnosis not present

## 2019-06-01 DIAGNOSIS — H401131 Primary open-angle glaucoma, bilateral, mild stage: Secondary | ICD-10-CM | POA: Diagnosis not present

## 2019-07-30 DIAGNOSIS — Z23 Encounter for immunization: Secondary | ICD-10-CM | POA: Diagnosis not present

## 2020-02-29 ENCOUNTER — Telehealth: Payer: Self-pay | Admitting: Cardiovascular Disease

## 2020-02-29 NOTE — Telephone Encounter (Signed)
Patient changed providers deleting recall 

## 2020-08-07 ENCOUNTER — Telehealth (HOSPITAL_BASED_OUTPATIENT_CLINIC_OR_DEPARTMENT_OTHER): Payer: Self-pay | Admitting: NURSE PRACTITIONER

## 2020-08-07 NOTE — Telephone Encounter (Signed)
Ok to schedule, however please make the patient aware that there is an [facility] clinic in [place], the same [place] she lives if that is easier for her to travel to given her age.    Also please notify the referring provider that vascular surgery is a different clinic (vascular surgery referral request on same referral as cardiology) and a referral should be sent to vascular surgery separately.

## 2020-08-07 NOTE — Telephone Encounter (Signed)
Please see referral in media . They also sent over pt records .

## 2020-12-04 ENCOUNTER — Other Ambulatory Visit (HOSPITAL_COMMUNITY): Payer: Self-pay | Admitting: NURSE PRACTITIONER

## 2020-12-04 ENCOUNTER — Other Ambulatory Visit (INDEPENDENT_AMBULATORY_CARE_PROVIDER_SITE_OTHER): Payer: Self-pay | Admitting: NURSE PRACTITIONER

## 2020-12-04 ENCOUNTER — Ambulatory Visit (HOSPITAL_BASED_OUTPATIENT_CLINIC_OR_DEPARTMENT_OTHER): Payer: Self-pay | Admitting: Cardiovascular Disease

## 2020-12-04 ENCOUNTER — Ambulatory Visit (INDEPENDENT_AMBULATORY_CARE_PROVIDER_SITE_OTHER): Payer: Self-pay | Admitting: Vascular & Interventional Radiology

## 2020-12-04 DIAGNOSIS — I482 Chronic atrial fibrillation, unspecified: Secondary | ICD-10-CM

## 2020-12-04 DIAGNOSIS — I4891 Unspecified atrial fibrillation: Secondary | ICD-10-CM

## 2020-12-06 ENCOUNTER — Encounter (INDEPENDENT_AMBULATORY_CARE_PROVIDER_SITE_OTHER): Payer: Self-pay

## 2020-12-18 ENCOUNTER — Ambulatory Visit (INDEPENDENT_AMBULATORY_CARE_PROVIDER_SITE_OTHER)
Admission: RE | Admit: 2020-12-18 | Discharge: 2020-12-18 | Disposition: A | Discharge status: Home / Self Care | Payer: MEDICARE | Source: Ambulatory Visit | Attending: NURSE PRACTITIONER | Admitting: NURSE PRACTITIONER

## 2020-12-18 ENCOUNTER — Ambulatory Visit
Admission: RE | Admit: 2020-12-18 | Discharge: 2020-12-18 | Disposition: A | Discharge status: Home / Self Care | Payer: MEDICARE | Source: Ambulatory Visit | Attending: NURSE PRACTITIONER | Admitting: NURSE PRACTITIONER

## 2020-12-18 ENCOUNTER — Other Ambulatory Visit: Payer: Self-pay

## 2020-12-18 DIAGNOSIS — I4891 Unspecified atrial fibrillation: Secondary | ICD-10-CM | POA: Insufficient documentation

## 2020-12-18 DIAGNOSIS — I482 Chronic atrial fibrillation, unspecified: Secondary | ICD-10-CM | POA: Insufficient documentation

## 2021-10-03 ENCOUNTER — Other Ambulatory Visit (HOSPITAL_COMMUNITY): Payer: Self-pay | Admitting: Family

## 2021-10-03 DIAGNOSIS — I4891 Unspecified atrial fibrillation: Secondary | ICD-10-CM

## 2021-10-03 DIAGNOSIS — R0600 Dyspnea, unspecified: Secondary | ICD-10-CM

## 2021-10-10 ENCOUNTER — Other Ambulatory Visit: Payer: Self-pay

## 2021-10-10 ENCOUNTER — Inpatient Hospital Stay
Admission: RE | Admit: 2021-10-10 | Discharge: 2021-10-10 | Disposition: A | Discharge status: Home / Self Care | Payer: MEDICARE | Source: Ambulatory Visit | Attending: Family | Admitting: Family

## 2021-10-10 DIAGNOSIS — I4891 Unspecified atrial fibrillation: Secondary | ICD-10-CM | POA: Insufficient documentation

## 2021-10-10 DIAGNOSIS — R0600 Dyspnea, unspecified: Secondary | ICD-10-CM | POA: Insufficient documentation

## 2022-02-23 ENCOUNTER — Other Ambulatory Visit: Payer: Self-pay

## 2022-02-23 ENCOUNTER — Encounter (HOSPITAL_COMMUNITY): Payer: Self-pay

## 2022-02-23 ENCOUNTER — Observation Stay
Admission: EM | Admit: 2022-02-23 | Discharge: 2022-02-24 | Disposition: A | Discharge status: Home / Self Care | Payer: MEDICARE | Attending: Family Medicine | Admitting: Family Medicine

## 2022-02-23 ENCOUNTER — Emergency Department (HOSPITAL_COMMUNITY): Payer: MEDICARE

## 2022-02-23 DIAGNOSIS — I10 Essential (primary) hypertension: Secondary | ICD-10-CM | POA: Diagnosis present

## 2022-02-23 DIAGNOSIS — E039 Hypothyroidism, unspecified: Secondary | ICD-10-CM | POA: Insufficient documentation

## 2022-02-23 DIAGNOSIS — Z955 Presence of coronary angioplasty implant and graft: Secondary | ICD-10-CM | POA: Insufficient documentation

## 2022-02-23 DIAGNOSIS — I110 Hypertensive heart disease with heart failure: Secondary | ICD-10-CM | POA: Insufficient documentation

## 2022-02-23 DIAGNOSIS — I5022 Chronic systolic (congestive) heart failure: Secondary | ICD-10-CM | POA: Insufficient documentation

## 2022-02-23 DIAGNOSIS — I4891 Unspecified atrial fibrillation: Secondary | ICD-10-CM | POA: Insufficient documentation

## 2022-02-23 DIAGNOSIS — E785 Hyperlipidemia, unspecified: Secondary | ICD-10-CM | POA: Diagnosis present

## 2022-02-23 DIAGNOSIS — I2510 Atherosclerotic heart disease of native coronary artery without angina pectoris: Secondary | ICD-10-CM | POA: Insufficient documentation

## 2022-02-23 DIAGNOSIS — R079 Chest pain, unspecified: Principal | ICD-10-CM | POA: Insufficient documentation

## 2022-02-23 DIAGNOSIS — R778 Other specified abnormalities of plasma proteins: Secondary | ICD-10-CM

## 2022-02-23 DIAGNOSIS — H409 Unspecified glaucoma: Secondary | ICD-10-CM | POA: Diagnosis present

## 2022-02-23 DIAGNOSIS — Z8679 Personal history of other diseases of the circulatory system: Secondary | ICD-10-CM

## 2022-02-23 DIAGNOSIS — Z853 Personal history of malignant neoplasm of breast: Secondary | ICD-10-CM | POA: Diagnosis present

## 2022-02-23 DIAGNOSIS — I252 Old myocardial infarction: Secondary | ICD-10-CM | POA: Insufficient documentation

## 2022-02-23 HISTORY — DX: Malignant (primary) neoplasm, unspecified (CMS HCC): C80.1

## 2022-02-23 HISTORY — DX: Acute myocardial infarction, unspecified (CMS HCC): I21.9

## 2022-02-23 HISTORY — DX: Arthropathy, unspecified: M12.9

## 2022-02-23 HISTORY — DX: Cardiac murmur, unspecified: R01.1

## 2022-02-23 HISTORY — DX: Disorder of thyroid, unspecified: E07.9

## 2022-02-23 HISTORY — DX: Essential (primary) hypertension: I10

## 2022-02-23 LAB — TROPONIN-I: TROPONIN I: 27.6 ng/L (ref <=35.0)

## 2022-02-23 LAB — ECG 12-LEAD
Atrial Rate: 94 {beats}/min
Calculated R Axis: 20 degrees
Calculated T Axis: 29 degrees
QRS Duration: 110 ms
QT Interval: 406 ms
QTC Calculation: 450 ms
Ventricular rate: 74 {beats}/min

## 2022-02-23 LAB — COMPREHENSIVE METABOLIC PANEL, NON-FASTING
ALBUMIN: 3.1 g/dL — ABNORMAL LOW (ref 3.4–4.8)
ALKALINE PHOSPHATASE: 59 U/L (ref 55–145)
ALT (SGPT): 12 U/L (ref 8–22)
ANION GAP: 9 mmol/L (ref 4–13)
AST (SGOT): 17 U/L (ref 8–45)
BILIRUBIN TOTAL: 1.1 mg/dL (ref 0.3–1.3)
BUN/CREA RATIO: 19 (ref 6–22)
BUN: 19 mg/dL (ref 8–25)
CALCIUM: 10.2 mg/dL (ref 8.8–10.2)
CHLORIDE: 110 mmol/L (ref 96–111)
CO2 TOTAL: 24 mmol/L (ref 23–31)
CREATININE: 1 mg/dL (ref 0.60–1.05)
ESTIMATED GFR: 54 mL/min/BSA — ABNORMAL LOW (ref >=60)
GLUCOSE: 95 mg/dL (ref 65–125)
POTASSIUM: 3.9 mmol/L (ref 3.5–5.1)
PROTEIN TOTAL: 6.6 g/dL (ref 6.0–8.0)
SODIUM: 143 mmol/L (ref 136–145)

## 2022-02-23 LAB — CBC WITH DIFF
BASOPHIL #: <0.1 10*3/uL (ref <=0.20)
BASOPHIL %: 1 %
EOSINOPHIL #: 0.24 10*3/uL (ref <=0.50)
EOSINOPHIL %: 3 %
HCT: 37.1 % (ref 34.8–46.0)
HGB: 11.2 g/dL — ABNORMAL LOW (ref 11.5–16.0)
IMMATURE GRANULOCYTE #: <0.1 10*3/uL (ref <0.10)
IMMATURE GRANULOCYTE %: 0 % (ref 0–1)
LYMPHOCYTE #: 1.42 10*3/uL (ref 1.00–4.80)
LYMPHOCYTE %: 17 %
MCH: 27.7 pg (ref 26.0–32.0)
MCHC: 30.2 g/dL — ABNORMAL LOW (ref 31.0–35.5)
MCV: 91.6 fL (ref 78.0–100.0)
MONOCYTE #: 0.92 10*3/uL (ref 0.20–1.10)
MONOCYTE %: 11 %
MPV: 9.6 fL (ref 8.7–12.5)
NEUTROPHIL #: 5.78 10*3/uL (ref 1.50–7.70)
NEUTROPHIL %: 68 %
PLATELETS: 254 10*3/uL (ref 150–400)
RBC: 4.05 10*6/uL (ref 3.85–5.22)
RDW-CV: 17.6 % — ABNORMAL HIGH (ref 11.5–15.5)
WBC: 8.4 10*3/uL (ref 3.7–11.0)

## 2022-02-23 LAB — BLUE TOP TUBE

## 2022-02-23 LAB — GRAY TOP TUBE

## 2022-02-23 LAB — CREATINE KINASE (CK), TOTAL, SERUM: CREATINE KINASE: 62 U/L (ref 25–190)

## 2022-02-23 MED ORDER — ONDANSETRON HCL (PF) 4 MG/2 ML INJECTION SOLUTION
4.0000 mg | Freq: Four times a day (QID) | INTRAMUSCULAR | Status: DC | PRN
Start: 2022-02-23 — End: 2022-02-24

## 2022-02-23 MED ORDER — MAGNESIUM HYDROXIDE 400 MG/5 ML ORAL SUSPENSION
15.0000 mL | Freq: Every day | ORAL | Status: DC | PRN
Start: 2022-02-23 — End: 2022-02-24

## 2022-02-23 MED ORDER — SODIUM CHLORIDE 0.9 % (FLUSH) INJECTION SYRINGE
10.0000 mL | INJECTION | Freq: Three times a day (TID) | INTRAMUSCULAR | Status: DC
Start: 2022-02-24 — End: 2022-02-24
  Administered 2022-02-24 (×3): 0 mL

## 2022-02-23 MED ORDER — SODIUM CHLORIDE 0.9 % (FLUSH) INJECTION SYRINGE
10.0000 mL | INJECTION | INTRAMUSCULAR | Status: DC | PRN
Start: 2022-02-23 — End: 2022-02-24

## 2022-02-23 MED ORDER — ASPIRIN 81 MG CHEWABLE TABLET
243.0000 mg | CHEWABLE_TABLET | ORAL | Status: AC
Start: 2022-02-23 — End: 2022-02-23
  Administered 2022-02-23: 243 mg via ORAL
  Filled 2022-02-23: qty 3

## 2022-02-23 NOTE — ED Nurses Note (Signed)
Report called to [facility] on med surg

## 2022-02-23 NOTE — ED Triage Notes (Signed)
Pt reports that she has L sided chest pain that radiates to her L arm, reports minimal SOB and weakness. States she cannot recall what time it started but her daughter notes that she first noticed she was in pain around 1500   Pt has hx of Afib, MI, stent placement, and a murmur  Pt reports taking her daily asa this morning but denies taking nitro rates pain as "not that bad" unable to give a 1/10 rating and states the pain comes intermittently, unable to describe quality

## 2022-02-23 NOTE — ED Provider Notes (Signed)
[facility]  Emergency Department  Provider Note    Name: [name]  Age and Gender: 88 y.o. female  Date of Birth: 27-Jun-1929  Date of Service: 02/23/2022  [place] [address]  [phone] (home)  MRN: [id number]  PCP: Lynda Rainwater, FNP    Chief Complaint:   Chief Complaint   Patient presents with   . Chest Pain        HPI:  HPI    This is a 88 y.o. female who presents to the emergency department with complaints of chest pain.  The pain is been off and on for the last week or so.  She says it comes may be every so often through the day.  He is very difficult historian.  The pain is mid sternal.  No projection aware.  Pain with known coronary disease.  She follows with Cardiology impact.  Affect she is to have a follow-up visit with cardiologist, Dr. Doree Fudge from [facility] later on next month.  She does have a history of a stent in the distant past probably 2-3 years ago    ASA:  Given here in the emergency    Cardiac Risk Factors:  Known history of coronary disease.  Negative for smoking.  Positive for hypertension.  Negative for diabetes.    H/O Prior CAD:  Yes    Past Medical / Surgical / Social History:  Past Medical History:   Past Medical History:   Diagnosis Date   . Arthropathy    . Cancer (CMS HCC)     breast   . Heart murmur    . HTN (hypertension)    . MI (myocardial infarction) (CMS [place])    . Thyroid disease        Past Surgical History:   Past Surgical History:   Procedure Laterality Date   . Hx coronary stent placement     . Hx lymph node dissection Right        Social History:   Social History     Tobacco Use   . Smoking status: Never   . Smokeless tobacco: Never   Vaping Use   . Vaping Use: Never used   Substance Use Topics   . Alcohol use: Never   . Drug use: Never      Social History     Substance and Sexual Activity   Drug Use Never       Allergies   Allergies: No Known Allergies    Medications   Medications Prior to Admission     Prescriptions     apixaban (ELIQUIS) 5 mg Oral Tablet    Take 1 Tablet (5 mg total) by mouth Twice daily    aspirin (ECOTRIN) 81 mg Oral Tablet, Delayed Release (E.C.)    Take 1 Tablet (81 mg total) by mouth Once a day    carvediloL (COREG) 12.5 mg Oral Tablet    Take 1 Tablet (12.5 mg total) by mouth Twice daily    COMBIGAN 0.2-0.5 % Ophthalmic Drops    INSTILL 1 DROP IN BOTH EYES TWICE DAILY    cyanocobalamin (VITAMIN B-12) 500 mcg Oral Tablet    Take 1 Tablet (500 mcg total) by mouth Once a day    ferrous sulfate (FEOSOL) 325 mg (65 mg iron) Oral Tablet, Delayed Release (E.C.)    Take 1 Tablet (325 mg total) by mouth    levothyroxine (SYNTHROID) 125 mcg Oral Tablet    Take 1 Tablet (125  mcg total) by mouth Once a day    losartan (COZAAR) 25 mg Oral Tablet    Take 0.5 Tablets (12.5 mg total) by mouth    Multivitamins with Fluoride (MULTI-VITAMIN PO)    Take by mouth    potassium chloride (K-DUR) 20 mEq Oral Tab Sust.Rel. Particle/Crystal    Take 2 Tablets (40 mEq total) by mouth Twice daily    simvastatin (ZOCOR) 40 mg Oral Tablet    Take 1 Tablet (40 mg total) by mouth Once a day    torsemide (DEMADEX) 20 mg Oral Tablet    Take by mouth Once a day          ROS:  ROS : Per HPI unless otherwise specified    Physical Exam:  ED Triage Vitals [02/23/22 1923]   BP (Non-Invasive) (!) 191/102   Heart Rate 71   Respiratory Rate (!) 24   Temperature 36.3 C (97.3 F)   SpO2 97 %   Weight 86.2 kg (190 lb)   Height 1.676 m ('5\' 6"'$ )     Body mass index is 30.67 kg/m.    Physical Exam  Vitals and nursing note reviewed.   Constitutional:       General: She is not in acute distress.     Appearance: She is not ill-appearing or toxic-appearing.   HENT:      Nose: Nose normal.   Eyes:      Pupils: Pupils are equal, round, and reactive to light.   Cardiovascular:      Rate and Rhythm: Normal rate and regular rhythm.      Heart sounds: Normal heart sounds.   Pulmonary:      Effort: Pulmonary effort is normal.      Breath sounds: Normal breath  sounds.   Abdominal:      General: Bowel sounds are normal.      Palpations: Abdomen is soft.   Musculoskeletal:         General: Normal range of motion.      Cervical back: Neck supple.   Skin:     General: Skin is warm and dry.   Neurological:      Mental Status: She is alert and oriented to person, place, and time.   Psychiatric:         Mood and Affect: Affect normal.           Procedures  :  No    Emergency Department Testing:    EKG Interpretation atrial fibrillation.  This is a known history of atrial fibrillation.  Rate controlled at 74.  No other ST or T-wave changes to suggest acute ischemic changes.    Orders Placed This Encounter   . CANCELED: XR CHEST PA AND LATERAL   . XR AP MOBILE CHEST   . EXTRA TUBES   . BLUE TOP TUBE   . LIGHT GREEN TOP TUBE   . LAVENDER TOP TUBE   . GRAY TOP TUBE   . CBC/DIFF   . COMPREHENSIVE METABOLIC PANEL, NON-FASTING   . CREATINE KINASE (CK), TOTAL, SERUM   . CANCELED: D-DIMER   . TROPONIN-I   . CBC WITH DIFF   . ECG 12-LEAD   . aspirin chewable tablet 243 mg     (See Results At [facility] if applicable)    Clinical Impression:   Clinical Impression   Chest pain, rule out acute myocardial infarction (Primary)   Elevated troponin   History of atrial fibrillation  ED Course / MDM / Plan:   Patient was triaged, vital signs were obtained, patient was  placed in a room.  On exam patient alert she is in no acute distress.  She is currently completely chest pain-free exam is unremarkable  Differential diagnosis considered but not limited to:  Acute coronary syndrome care, noncardiac chest pain all included in the differential.  ED Course / plan:  . Workup here reveals an elevated troponin 28.    . I have offered a 2nd troponin verses overnight observation with a rule out for chest pain.  She has elected to be observed in the hospital overnight.    . Currently chest pain-free.    . Aspirin given here in the emergency department.  Noted additional intervention indicated at this point  EKG is unremarkable other than known atrial fibrillation    Medical Decision Making  Chest pain, rule out acute myocardial infarction: acute illness or injury     Details: Rule out acute coronary syndrome  Elevated troponin: acute illness or injury     Details: Rule out acute coronary syndrome  History of atrial fibrillation: acute illness or injury     Details: Known history of atrial fibrillation.  Currently on Eliquis.  Amount and/or Complexity of Data Reviewed  Labs: ordered. Decision-making details documented in ED Course.     Details: Noted elevated troponin  Radiology: ordered and independent interpretation performed.     Details: No acute infiltrate  ECG/medicine tests: ordered and independent interpretation performed.     Details: EKG noted and results updated      Risk  OTC drugs.  Decision regarding hospitalization.               Medications Administered in the ED       Disposition: Admitted  Meds Prescribed:  New Prescriptions    No medications on file      BP (!) 182/79   Pulse 68   Temp 36.3 C (97.3 F)   Resp (!) 21   Ht 1.676 m ('5\' 6"'$ )   Wt 86.2 kg (190 lb)   SpO2 94%   BMI 30.67 kg/m       No follow-up provider specified.            Trilby Leaver, MD  02/23/2022  21:58  Department of Emergency Medicine  [place]  ________________________________    Results for orders placed or performed during the hospital encounter of 02/23/22 (from the past 12 hour(s))   COMPREHENSIVE METABOLIC PANEL, NON-FASTING   Result Value Ref Range    SODIUM 143 136 - 145 mmol/L    POTASSIUM 3.9 3.5 - 5.1 mmol/L    CHLORIDE 110 96 - 111 mmol/L    CO2 TOTAL 24 23 - 31 mmol/L    ANION GAP 9 4 - 13 mmol/L    BUN 19 8 - 25 mg/dL    CREATININE 1.00 0.60 - 1.05 mg/dL    BUN/CREA RATIO 19 6 - 22    ESTIMATED GFR 54 (L) >=60 mL/min/BSA    ALBUMIN 3.1 (L) 3.4 - 4.8 g/dL     CALCIUM 10.2 8.8 - 10.2 mg/dL    GLUCOSE 95 65 - 125 mg/dL    ALKALINE PHOSPHATASE 59 55 - 145 U/L    ALT (SGPT) 12 8 - 22 U/L    AST  (SGOT)  17 8 - 45 U/L    BILIRUBIN TOTAL 1.1 0.3 - 1.3 mg/dL    PROTEIN TOTAL 6.6  6.0 - 8.0 g/dL   CREATINE KINASE (CK), TOTAL, SERUM   Result Value Ref Range    CREATINE KINASE 62 25 - 190 U/L   TROPONIN-I   Result Value Ref Range    TROPONIN I 27.6 (HH) Female: <=35.0 ng/L; Female: <=14.0 ng/L ng/L   CBC WITH DIFF   Result Value Ref Range    WBC 8.4 3.7 - 11.0 x10^3/uL    RBC 4.05 3.85 - 5.22 x10^6/uL    HGB 11.2 (L) 11.5 - 16.0 g/dL    HCT 37.1 34.8 - 46.0 %    MCV 91.6 78.0 - 100.0 fL    MCH 27.7 26.0 - 32.0 pg    MCHC 30.2 (L) 31.0 - 35.5 g/dL    RDW-CV 17.6 (H) 11.5 - 15.5 %    PLATELETS 254 150 - 400 x10^3/uL    MPV 9.6 8.7 - 12.5 fL    NEUTROPHIL % 68 %    LYMPHOCYTE % 17 %    MONOCYTE % 11 %    EOSINOPHIL % 3 %    BASOPHIL % 1 %    NEUTROPHIL # 5.78 1.50 - 7.70 x10^3/uL    LYMPHOCYTE # 1.42 1.00 - 4.80 x10^3/uL    MONOCYTE # 0.92 0.20 - 1.10 x10^3/uL    EOSINOPHIL # 0.24 <=0.50 x10^3/uL    BASOPHIL # <0.10 <=0.20 x10^3/uL    IMMATURE GRANULOCYTE % 0 0 - 1 %    IMMATURE GRANULOCYTE # <0.10 <0.10 x10^3/uL       Labs Ordered/Reviewed   COMPREHENSIVE METABOLIC PANEL, NON-FASTING - Abnormal; Notable for the following components:       Result Value    ESTIMATED GFR 54 (*)     ALBUMIN 3.1 (*)     All other components within normal limits   TROPONIN-I - Abnormal; Notable for the following components:    TROPONIN I 27.6 (*)     All other components within normal limits   CBC WITH DIFF - Abnormal; Notable for the following components:    HGB 11.2 (*)     MCHC 30.2 (*)     RDW-CV 17.6 (*)     All other components within normal limits   CREATINE KINASE (CK), TOTAL, SERUM - Normal   CBC/DIFF    Narrative:     The following orders were created for panel order CBC/DIFF.  Procedure                               Abnormality         Status                     ---------                               -----------         ------                     CBC WITH DIFF[[id number]]                Abnormal            Final result                  Please view results for these tests on the individual orders.   EXTRA TUBES    Narrative:  The following orders were created for panel order EXTRA TUBES.  Procedure                               Abnormality         Status                     ---------                               -----------         ------                     BLUE TOP KCLE[[id number]]                                    In process                 LIGHT GREEN TOP BSWH[[id number]]                             In process                 LAVENDER TOP TUBE[[id number]]                                In process                 GRAY TOP YKZL[[id number]]                                    In process                   Please view results for these tests on the individual orders.   BLUE TOP TUBE   LIGHT GREEN TOP TUBE   LAVENDER TOP TUBE   GRAY TOP TUBE       XR AP MOBILE CHEST   Final Result by Edi, [facility] In (04/30 2053)   FINDINGS/IMPRESSION: Heart is enlarged. Central vascular congestion without overt pulmonary edema. Mild bibasilar atelectasis versus pneumonia. Question small left pleural effusion. No visible pneumothorax. Degenerative changes at the shoulders.                        Radiologist location ID: [id number]             This note was partially created using voice recognition software and is inherently subject to errors including those of syntax and "sound alike " substitutions which may escape proof reading.  In such instances, original meaning may be extrapolated by contextual derivation.

## 2022-02-24 ENCOUNTER — Encounter (HOSPITAL_COMMUNITY): Payer: Self-pay | Admitting: Family Medicine

## 2022-02-24 LAB — TROPONIN-I
TROPONIN I: 28.1 ng/L (ref <=35.0)
TROPONIN I: 26.7 ng/L (ref <=35.0)
TROPONIN I: 34.5 ng/L (ref <=35.0)
TROPONIN I: 31.8 ng/L (ref <=35.0)

## 2022-02-24 LAB — LAVENDER TOP TUBE

## 2022-02-24 LAB — LIGHT GREEN TOP TUBE

## 2022-02-24 MED ORDER — LOSARTAN 25 MG TABLET
12.5000 mg | ORAL_TABLET | Freq: Every day | ORAL | Status: DC
Start: 2022-02-24 — End: 2022-02-24
  Administered 2022-02-24: 12.5 mg via ORAL
  Filled 2022-02-24: qty 1

## 2022-02-24 MED ORDER — CARVEDILOL 3.125 MG TABLET
3.1250 mg | ORAL_TABLET | Freq: Two times a day (BID) | ORAL | 0 refills | Status: DC
Start: 2022-02-24 — End: 2023-08-12

## 2022-02-24 MED ORDER — BRIMONIDINE 0.2 %-TIMOLOL 0.5 % EYE DROPS
1.0000 [drp] | Freq: Two times a day (BID) | OPHTHALMIC | Status: DC
Start: 2022-02-24 — End: 2022-02-24
  Administered 2022-02-24: 0 [drp] via OPHTHALMIC

## 2022-02-24 MED ORDER — ATORVASTATIN 20 MG TABLET
20.0000 mg | ORAL_TABLET | Freq: Every evening | ORAL | Status: DC
Start: 2022-02-24 — End: 2022-02-24

## 2022-02-24 MED ORDER — ASPIRIN 81 MG TABLET,DELAYED RELEASE
81.0000 mg | DELAYED_RELEASE_TABLET | Freq: Every day | ORAL | Status: DC
Start: 2022-02-24 — End: 2022-02-24
  Administered 2022-02-24: 81 mg via ORAL
  Filled 2022-02-24: qty 1

## 2022-02-24 MED ORDER — APIXABAN 5 MG TABLET
5.0000 mg | ORAL_TABLET | Freq: Two times a day (BID) | ORAL | Status: DC
Start: 2022-02-24 — End: 2022-02-24
  Administered 2022-02-24: 5 mg via ORAL
  Filled 2022-02-24: qty 1

## 2022-02-24 MED ORDER — LEVOTHYROXINE 125 MCG TABLET
125.0000 ug | ORAL_TABLET | Freq: Every day | ORAL | Status: DC
Start: 2022-02-24 — End: 2022-02-24
  Administered 2022-02-24: 125 ug via ORAL
  Filled 2022-02-24: qty 1

## 2022-02-24 MED ORDER — POTASSIUM CHLORIDE ER 20 MEQ TABLET,EXTENDED RELEASE(PART/CRYST)
40.0000 meq | ORAL_TABLET | Freq: Two times a day (BID) | ORAL | Status: DC
Start: 2022-02-24 — End: 2022-02-24
  Administered 2022-02-24: 40 meq via ORAL
  Filled 2022-02-24: qty 2

## 2022-02-24 MED ORDER — CARVEDILOL 12.5 MG TABLET
12.5000 mg | ORAL_TABLET | Freq: Two times a day (BID) | ORAL | Status: DC
Start: 2022-02-24 — End: 2022-02-24
  Administered 2022-02-24: 0 mg via ORAL

## 2022-02-24 MED ORDER — CYANOCOBALAMIN (VIT B-12) 1,000 MCG TABLET
500.0000 ug | ORAL_TABLET | Freq: Every day | ORAL | Status: DC
Start: 2022-02-24 — End: 2022-02-24
  Administered 2022-02-24: 500 ug via ORAL
  Filled 2022-02-24: qty 1

## 2022-02-24 MED ORDER — ISOSORBIDE MONONITRATE ER 30 MG TABLET,EXTENDED RELEASE 24 HR
30.0000 mg | ORAL_TABLET | Freq: Every morning | ORAL | 0 refills | Status: DC
Start: 2022-02-24 — End: 2023-03-16

## 2022-02-24 MED ORDER — ACETAMINOPHEN 325 MG TABLET
650.0000 mg | ORAL_TABLET | ORAL | Status: DC | PRN
Start: 2022-02-24 — End: 2022-02-24
  Administered 2022-02-24: 650 mg via ORAL
  Filled 2022-02-24: qty 2

## 2022-02-24 MED ORDER — TORSEMIDE 10 MG TABLET
20.0000 mg | ORAL_TABLET | Freq: Every day | ORAL | Status: DC
Start: 2022-02-24 — End: 2022-02-24
  Administered 2022-02-24: 20 mg via ORAL
  Filled 2022-02-24: qty 2

## 2022-02-24 MED ORDER — FERROUS SULFATE 325 MG (65 MG IRON) TABLET
325.0000 mg | ORAL_TABLET | Freq: Every day | ORAL | Status: DC
Start: 2022-02-24 — End: 2022-02-24
  Administered 2022-02-24: 325 mg via ORAL
  Filled 2022-02-24: qty 1

## 2022-02-24 MED ORDER — CLONIDINE HCL 0.1 MG TABLET
0.2000 mg | ORAL_TABLET | Freq: Once | ORAL | Status: AC
Start: 2022-02-24 — End: 2022-02-24
  Administered 2022-02-24: 0.2 mg via ORAL
  Filled 2022-02-24: qty 2

## 2022-02-24 NOTE — Care Plan (Signed)
Problem: Adult Inpatient Plan of Care  Goal: Plan of Care Review  Outcome: Ongoing (see interventions/notes)  Goal: Patient-Specific Goal (Individualized)  Outcome: Ongoing (see interventions/notes)  Flowsheets  Taken 02/24/2022 0100  Individualized Care Needs: telemetry, lab work  Anxieties, Fears or Concerns: none voiced  Patient-Specific Goals (Include Timeframe): no more chest pain  Taken 02/24/2022 0014  Individualized Care Needs: telemetry, lab work  Anxieties, Fears or Concerns: none voiced  Patient-Specific Goals (Include Timeframe): no more chest pain  Goal: Absence of Hospital-Acquired Illness or Injury  Outcome: Ongoing (see interventions/notes)  Intervention: Identify and Manage Fall Risk  Recent Flowsheet Documentation  Taken 02/24/2022 0100 by Arneta Cliche, RN  Safety Promotion/Fall Prevention: activity supervised  Intervention: Prevent Skin Injury  Recent Flowsheet Documentation  Taken 02/24/2022 0100 by Arneta Cliche, RN  Skin Protection: adhesive use limited  Intervention: Prevent and Manage VTE (Venous Thromboembolism) Risk  Recent Flowsheet Documentation  Taken 02/24/2022 0100 by Arneta Cliche, RN  VTE Prevention/Management: ambulation promoted  Goal: Optimal Comfort and Wellbeing  Outcome: Ongoing (see interventions/notes)  Intervention: Provide Person-Centered Care  Recent Flowsheet Documentation  Taken 02/24/2022 0100 by Arneta Cliche, RN  Trust Relationship/Rapport:   care explained   choices provided   questions answered  Goal: Rounds/Family Conference  Outcome: Ongoing (see interventions/notes)     Problem: Adult Inpatient Plan of Care  Goal: Absence of Hospital-Acquired Illness or Injury  Outcome: Ongoing (see interventions/notes)  Intervention: Identify and Manage Fall Risk  Recent Flowsheet Documentation  Taken 02/24/2022 0100 by Arneta Cliche, RN  Safety Promotion/Fall Prevention: activity supervised  Intervention: Prevent Skin Injury  Recent Flowsheet Documentation  Taken 02/24/2022 0100 by  Arneta Cliche, RN  Skin Protection: adhesive use limited  Intervention: Prevent and Manage VTE (Venous Thromboembolism) Risk  Recent Flowsheet Documentation  Taken 02/24/2022 0100 by Arneta Cliche, RN  VTE Prevention/Management: ambulation promoted     Arneta Cliche, RN  02/24/2022, 01:43

## 2022-02-24 NOTE — Nurses Notes (Signed)
Patient admitted to room 1129. A&Ox4. Telemetry initiated. IV patent to left a/c, flushes easily. Patient denies any chest pain at this time. Blood pressure 200/98, taken manually.  Dr. Purvis Kilts notified. New order to give Clonidine 0.'2mg'$  x1 now for hypertension. Call light in reach.

## 2022-02-24 NOTE — Nurses Notes (Signed)
Patient discharged home with family.  AVS reviewed with patient/care giver.  A written copy of the AVS and discharge instructions was given to the patient/care giver.  Questions sufficiently answered as needed.  Patient/care giver encouraged to follow up with PCP as indicated.  In the event of an emergency, patient/care giver instructed to call 911 or go to the nearest emergency room.

## 2022-02-24 NOTE — Care Management Notes (Signed)
02/24/22 1631   Assessment Details   Assessment Type Admission   Date of Care Management Update 02/24/22   Readmission   Is this a readmission? No   Insurance Information/Type   Insurance type Medicare   Employment/Financial   Patient has Prescription Coverage?  Yes        Name of Insurance Coverage for Medications Medicare [place] an age group to open "lives with" row.  Adult   Lives With child(ren), adult   Living Arrangements house   Home Safety   Home Assessment: No Problems Identified   Home Accessibility no concerns;bed not on first floor;stairs to enter home   Custody and Legal Status   Do you have a court appointed guardian/conservator? No   Care Management Plan   Discharge Planning Status initial meeting   Projected Discharge Date 02/25/22   Discharge plan discussed with: Patient   CM will evaluate for rehabilitation potential yes   Patient choice offered to patient/family yes   Discharge Needs Assessment   Outpatient/Agency/Support Group Needs homecare agency   Equipment Currently Used at Home walker, front wheeled   Equipment Needed After Discharge none   Discharge Facility/Level of Care Needs Home with Home Health (code 6)   Transportation Available car;family or friend will provide   Referral Information   Admission Type observation   Address Verified verified-no changes   Arrived From home or self-care   Observation Form   Observation Form Given MOON form given   MOON form explained/reviewed with:  Patient   MOON form date of delivery 02/23/22   MOON form time of delivery 2221   ADVANCE DIRECTIVES   Does the Patient have an Advance Directive? Yes, Patient Does Have Advance Directive for Healthcare Treatment   Type of Advance Directive Completed Medical Power of Attorney;Medical Living Will   Copy of Advance Directives in Chart? 6   Name of MPOA or [place]   Phone Number of [place] or Healthcare Surrogate [phone]   Patient  Requests Assistance in Having Advance Directive Notarized. N/A

## 2022-02-24 NOTE — Discharge Instructions (Signed)
See education.

## 2022-02-24 NOTE — Discharge Summary (Signed)
[facility]  DISCHARGE SUMMARY      PATIENT NAME:  [name], [name]  MRN:  [id number]  DOB:  October 04, 1929    INPATIENT ADMISSION DATE:   DISCHARGE DATE:  02/24/2022    ATTENDING PHYSICIAN: Bethanie Dicker, MD  SERVICE: [facility] HOSPITALIST  PRIMARY CARE PHYSICIAN: Lynda Rainwater, FNP       DISCHARGE DIAGNOSIS:     Active Hospital Problems    Diagnosis Date Noted   . Principal Problem: Chest pain, rule out acute myocardial infarction [R07.9] 02/23/2022   . Chronic systolic heart failure (CMS HCC) [I50.22] 04/23/2017   . Atherosclerosis of coronary artery [I25.10] 03/31/2017   . Atrial fibrillation (CMS HCC) [I48.91] 02/27/2017   . Essential hypertension [I10] 01/16/2017   . Glaucoma [H40.9] 01/16/2017   . History of breast cancer [Z85.3] 01/16/2017   . Hyperlipidemia [E78.5] 01/16/2017   . Hypothyroidism [E03.9] 01/16/2017      Resolved Hospital Problems   No resolved problems to display.         REASON FOR HOSPITALIZATION AND HOSPITAL COURSE:    This is a 88 y.o., female admitted for chest pain rule out M I.  The patient presented to the emergency department with complaints of chest pain that comes and goes over the last week or so.  The pain is midsternal.  She does have history of coronary artery disease. She follows with Cardiology in [place], Dr. Doree Fudge.  Patient does have a history of stent placed 2-3 years ago.  In the emergency department the patient was found to have very elevated blood pressure of 190/102.  The patient's daughter reports that the patient normally runs between 696-295 systolic.  Apparently she gets symptomatic if her blood pressure is much lower than 140.  She does have a history of atrial fibrillation.  Workup in the ER showed an elevated troponin of 27.6.  Otherwise her labs were unremarkable.  Chest x-ray showed mild bibasilar atelectasis.  EKG showed atrial fibrillation with a rate of 74.  Patient was admitted to the hospitalist service for monitoring on telemetry,  serial cardiac enzymes, and further treatment of hypertension.  Overnight the patient became very hypertensive with a blood pressure of over 284 systolic.  She got 0.2 mg of clonidine p.o. x1.  Patient responded well and her blood pressure dropped into the 132G to 401 systolic.  Patient has been asymptomatic since admission.  Serial troponins were monitored.  Troponin peaked at 34.5.  Last troponin was 31.8.  All the troponins were between 27 and 35.  Patient is chest pain-free.  Her carvedilol was held during her admission secondary to bradycardia where she had episodes where she went into the 30s and 40s.  Patient now on controlled atrial fibrillation with rates of 60s to 70s.  Patient can be discharged home at this time.  Will decrease the dose of carvedilol to 3.'125mg'$  b.i.d..  Will start patient on Imdur 30 mg p.o. daily.  Patient will follow-up with cardiology as soon as possible.  Patient follow-up with PCP in 1 week.      DISCHARGE MEDICATIONS:     Current Discharge Medication List      START taking these medications.      Details   isosorbide mononitrate 30 mg Tablet Sustained Release 24 hr  Commonly known as: IMDUR   30 mg, Oral, EVERY MORNING  Qty: 30 Tablet  Refills: 0        CONTINUE these medications which have CHANGED during your visit.  Details   carvediloL 3.125 mg Tablet  Commonly known as: COREG  What changed:    medication strength   how much to take   when to take this   3.125 mg, Oral, 2 TIMES DAILY WITH FOOD  Qty: 60 Tablet  Refills: 0        CONTINUE these medications - NO CHANGES were made during your visit.      Details   apixaban 5 mg Tablet  Commonly known as: ELIQUIS   1 Tablet, Oral, 2 TIMES DAILY  Refills: 0     aspirin 81 mg Tablet, Delayed Release (E.C.)  Commonly known as: ECOTRIN   81 mg, Oral, DAILY  Refills: 0     Combigan 0.2-0.5 % Drops  Generic drug: brimonidine-timoloL   INSTILL 1 DROP IN BOTH EYES TWICE DAILY  Refills: 0     cyanocobalamin 500 mcg Tablet  Commonly  known as: VITAMIN B-12   500 mcg, Oral, DAILY  Refills: 0     ferrous sulfate 325 mg (65 mg iron) Tablet, Delayed Release (E.C.)  Commonly known as: FEOSOL   325 mg, Oral  Refills: 0     levothyroxine 125 mcg Tablet  Commonly known as: SYNTHROID   125 mcg, Oral, DAILY  Refills: 0     losartan 25 mg Tablet  Commonly known as: COZAAR   12.5 mg, Oral  Refills: 0     MULTI-VITAMIN PO   Oral  Refills: 0     potassium chloride 20 mEq Tab Sust.Rel. Particle/Crystal  Commonly known as: [name]-DUR   40 mEq, Oral, 2 TIMES DAILY  Refills: 0     simvastatin 40 mg Tablet  Commonly known as: ZOCOR   40 mg, Oral, DAILY  Refills: 0     torsemide 20 mg Tablet  Commonly known as: DEMADEX   Oral, DAILY  Refills: 0            Filed Vitals:    02/24/22 0400 02/24/22 0734 02/24/22 0743 02/24/22 1132   BP: (!) 170/90  134/79 (!) 165/63   Pulse: (!) 105  75 52   Resp: 16  (!) 24 (!) 24   Temp: 36.7 C (98 F)  37.1 C (98.8 F) 36.4 C (97.6 F)   SpO2:  92%        Physical Exam  Vitals and nursing note reviewed.   Constitutional:       General: She is awake. She is not in acute distress.     Appearance: Normal appearance. She is well-developed and normal weight. She is not ill-appearing.      Comments: Chronically ill-appearing elderly female.  No acute distress.   HENT:      Head: Normocephalic and atraumatic.   Eyes:      Conjunctiva/sclera: Conjunctivae normal.      Pupils: Pupils are equal, round, and reactive to light.   Cardiovascular:      Rate and Rhythm: Normal rate and regular rhythm.      Heart sounds: Normal heart sounds. No murmur heard.    No friction rub. No gallop.   Pulmonary:      Effort: No respiratory distress.      Breath sounds: Normal breath sounds and air entry. No wheezing or rales.   Abdominal:      General: Bowel sounds are normal. There is no distension.      Palpations: Abdomen is soft. There is no mass.      Tenderness: There is no abdominal  tenderness. There is no guarding or rebound.   Musculoskeletal:          General: No deformity. Normal range of motion.      Cervical back: Normal range of motion and neck supple.   Skin:     General: Skin is warm and dry.      Findings: No erythema or rash.   Neurological:      Mental Status: She is alert and oriented to person, place, and time. Mental status is at baseline.      Cranial Nerves: No cranial nerve deficit.   Psychiatric:         Mood and Affect: Affect normal.         Behavior: Behavior is cooperative.         Cognition and Memory: Cognition and memory normal.         Laboratory Data:     Results for orders placed or performed during the hospital encounter of 02/23/22 (from the past 24 hour(s))   ECG 12-LEAD   Result Value Ref Range    Ventricular rate 74 BPM    Atrial Rate 94 BPM    QRS Duration 110 ms    QT Interval 406 ms    QTC Calculation 450 ms    Calculated R Axis 20 degrees    Calculated T Axis 29 degrees   COMPREHENSIVE METABOLIC PANEL, NON-FASTING   Result Value Ref Range    SODIUM 143 136 - 145 mmol/L    POTASSIUM 3.9 3.5 - 5.1 mmol/L    CHLORIDE 110 96 - 111 mmol/L    CO2 TOTAL 24 23 - 31 mmol/L    ANION GAP 9 4 - 13 mmol/L    BUN 19 8 - 25 mg/dL    CREATININE 1.00 0.60 - 1.05 mg/dL    BUN/CREA RATIO 19 6 - 22    ESTIMATED GFR 54 (L) >=60 mL/min/BSA    ALBUMIN 3.1 (L) 3.4 - 4.8 g/dL     CALCIUM 10.2 8.8 - 10.2 mg/dL    GLUCOSE 95 65 - 125 mg/dL    ALKALINE PHOSPHATASE 59 55 - 145 U/L    ALT (SGPT) 12 8 - 22 U/L    AST (SGOT)  17 8 - 45 U/L    BILIRUBIN TOTAL 1.1 0.3 - 1.3 mg/dL    PROTEIN TOTAL 6.6 6.0 - 8.0 g/dL   CREATINE KINASE (CK), TOTAL, SERUM   Result Value Ref Range    CREATINE KINASE 62 25 - 190 U/L   TROPONIN-I   Result Value Ref Range    TROPONIN I 27.6 (HH) Female: <=35.0 ng/L; Female: <=14.0 ng/L ng/L   CBC WITH DIFF   Result Value Ref Range    WBC 8.4 3.7 - 11.0 x10^3/uL    RBC 4.05 3.85 - 5.22 x10^6/uL    HGB 11.2 (L) 11.5 - 16.0 g/dL    HCT 37.1 34.8 - 46.0 %    MCV 91.6 78.0 - 100.0 fL    MCH 27.7 26.0 - 32.0 pg    MCHC 30.2 (L) 31.0 - 35.5 g/dL     RDW-CV 17.6 (H) 11.5 - 15.5 %    PLATELETS 254 150 - 400 x10^3/uL    MPV 9.6 8.7 - 12.5 fL    NEUTROPHIL % 68 %    LYMPHOCYTE % 17 %    MONOCYTE % 11 %    EOSINOPHIL % 3 %    BASOPHIL % 1 %    NEUTROPHIL #  5.78 1.50 - 7.70 x10^3/uL    LYMPHOCYTE # 1.42 1.00 - 4.80 x10^3/uL    MONOCYTE # 0.92 0.20 - 1.10 x10^3/uL    EOSINOPHIL # 0.24 <=0.50 x10^3/uL    BASOPHIL # <0.10 <=0.20 x10^3/uL    IMMATURE GRANULOCYTE % 0 0 - 1 %    IMMATURE GRANULOCYTE # <0.10 <0.10 x10^3/uL   BLUE TOP TUBE   Result Value Ref Range    RAINBOW/EXTRA TUBE AUTO RESULT Yes    LIGHT GREEN TOP TUBE   Result Value Ref Range    RAINBOW/EXTRA TUBE AUTO RESULT Yes    LAVENDER TOP TUBE   Result Value Ref Range    RAINBOW/EXTRA TUBE AUTO RESULT Yes    GRAY TOP TUBE   Result Value Ref Range    RAINBOW/EXTRA TUBE AUTO RESULT Yes    TROPONIN-I   Result Value Ref Range    TROPONIN I 28.1 (HH) Female: <=35.0 ng/L; Female: <=14.0 ng/L ng/L   TROPONIN-I   Result Value Ref Range    TROPONIN I 26.7 (HH) Female: <=35.0 ng/L; Female: <=14.0 ng/L ng/L   TROPONIN-I   Result Value Ref Range    TROPONIN I 34.5 (HH) Female: <=35.0 ng/L; Female: <=14.0 ng/L ng/L   TROPONIN-I   Result Value Ref Range    TROPONIN I 31.8 (HH) Female: <=35.0 ng/L; Female: <=14.0 ng/L ng/L       Imaging Studies:    XR AP MOBILE CHEST   Final Result by Edi, Radresults In (04/30 2053)   FINDINGS/IMPRESSION: Heart is enlarged. Central vascular congestion without overt pulmonary edema. Mild bibasilar atelectasis versus pneumonia. Question small left pleural effusion. No visible pneumothorax. Degenerative changes at the shoulders.                        Radiologist location ID: [id number]             DISCHARGE INSTRUCTIONS:   Follow-up Information     Lynda Rainwater, FNP Follow up in 1 week(s).    Specialty: NURSE PRACTITIONER  Contact information:  [address]  [place] [address]  [phone]                          DISCHARGE INSTRUCTION - DIET      Diet: RESUME HOME DIET      DISCHARGE INSTRUCTION - ACTIVITY     Activity: AS TOLERATED      DISCHARGE INSTRUCTION - MISC    Stop carvedilol 12.5.  Start carvedilol 3.125 p.o. b.i.d..  Start Imdur 30 mg p.o. daily.  Follow-up with cardiology ASAP.  Follow-up with your PCP in 1 week.          CONDITION ON DISCHARGE: Alert, Oriented and VS Stable    DISCHARGE DISPOSITION:  Home discharge       Bethanie Dicker, MD

## 2022-02-24 NOTE — Nurses Notes (Signed)
Notified Wilber Bihari, PA of no labs being ordered this morning. She states, " I will look into it."

## 2022-02-24 NOTE — H&P (Signed)
[facility]  Hospitalist  History & Physical    Date of Service:  02/24/2022  Morgan, [name], 88 y.o. female  Date of Admission:  02/23/2022  Date of Birth:  [birth year]  PCP: Lynda Rainwater, FNP    Chief Complaint:  Chest.     HPI:    [name] is a 88 y.o. White female who is admitted for chest pain rule out M I.  The patient presented to the emergency department with complaints of chest pain that comes and goes over the last week or so.  The pain is midsternal.  She does have history of coronary artery disease. She follows with Cardiology in [place], Dr. Doree Fudge.  Patient does have a history of stent placed 2-3 years ago.  In the emergency department the patient was found to have very elevated blood pressure of 190/102.  The patient's daughter reports that the patient normally runs between 419-622 systolic.  Apparently she gets symptomatic if her blood pressure is much lower than 140.  She does have a history of atrial fibrillation.  Workup in the ER showed an elevated troponin of 27.6.  Otherwise her labs were unremarkable.  Chest x-ray showed mild bibasilar atelectasis.  EKG showed atrial fibrillation with a rate of 74.  Patient was admitted to the hospitalist service for monitoring on telemetry, serial cardiac enzymes, and further treatment of hypertension.    Past Medical History:   Diagnosis Date   . Arthropathy    . Cancer (CMS HCC)     breast   . Heart murmur    . HTN (hypertension)    . MI (myocardial infarction) (CMS [place])    . Thyroid disease       Past Surgical History:   Procedure Laterality Date   . HX CORONARY STENT PLACEMENT     . HX LYMPH NODE DISSECTION Right       Social History     Tobacco Use   . Smoking status: Never   . Smokeless tobacco: Never   Vaping Use   . Vaping Use: Never used   Substance Use Topics   . Alcohol use: Never   . Drug use: Never       Family Medical History:    None        Medications Prior to Admission     Prescriptions    apixaban (ELIQUIS) 5  mg Oral Tablet    Take 1 Tablet (5 mg total) by mouth Twice daily    aspirin (ECOTRIN) 81 mg Oral Tablet, Delayed Release (E.C.)    Take 1 Tablet (81 mg total) by mouth Once a day    carvediloL (COREG) 12.5 mg Oral Tablet    Take 1 Tablet (12.5 mg total) by mouth Twice daily    COMBIGAN 0.2-0.5 % Ophthalmic Drops    INSTILL 1 DROP IN BOTH EYES TWICE DAILY    cyanocobalamin (VITAMIN B-12) 500 mcg Oral Tablet    Take 1 Tablet (500 mcg total) by mouth Once a day    ferrous sulfate (FEOSOL) 325 mg (65 mg iron) Oral Tablet, Delayed Release (E.C.)    Take 1 Tablet (325 mg total) by mouth    levothyroxine (SYNTHROID) 125 mcg Oral Tablet    Take 1 Tablet (125 mcg total) by mouth Once a day    losartan (COZAAR) 25 mg Oral Tablet    Take 0.5 Tablets (12.5 mg total) by mouth    Multivitamins with Fluoride (MULTI-VITAMIN PO)  Take by mouth    potassium chloride (K-DUR) 20 mEq Oral Tab Sust.Rel. Particle/Crystal    Take 2 Tablets (40 mEq total) by mouth Twice daily    simvastatin (ZOCOR) 40 mg Oral Tablet    Take 1 Tablet (40 mg total) by mouth Once a day    torsemide (DEMADEX) 20 mg Oral Tablet    Take by mouth Once a day         No Known Allergies     Review of Systems   Constitutional: Negative for chills, fever and malaise/fatigue.   HENT: Negative for congestion, ear pain, sore throat and tinnitus.    Eyes: Negative for blurred vision, photophobia and pain.   Respiratory: Negative for cough, hemoptysis, sputum production, shortness of breath and wheezing.    Cardiovascular: Positive for chest pain. Negative for palpitations, orthopnea, claudication, leg swelling and PND.   Gastrointestinal: Negative for abdominal pain, diarrhea, heartburn, nausea and vomiting.   Genitourinary: Negative for dysuria, frequency and urgency.   Musculoskeletal: Negative for back pain, joint pain and myalgias.   Skin: Negative for rash.   Neurological: Negative for dizziness, sensory change, speech change, focal weakness, weakness and  headaches.   Endo/Heme/Allergies: Negative for environmental allergies. Does not bruise/bleed easily.   Psychiatric/Behavioral: Negative for depression and substance abuse. The patient is not nervous/anxious.           Filed Vitals:    02/24/22 0400 02/24/22 0734 02/24/22 0743 02/24/22 1132   BP: (!) 170/90  134/79 (!) 165/63   Pulse: (!) 105  75 52   Resp: 16  (!) 24 (!) 24   Temp: 36.7 C (98 F)  37.1 C (98.8 F) 36.4 C (97.6 F)   SpO2:  92%         Physical Exam  Vitals and nursing note reviewed.   Constitutional:       General: She is awake. She is not in acute distress.     Appearance: Normal appearance. She is well-developed and normal weight. She is ill-appearing (Chronically ill appearing elderly female).   HENT:      Head: Normocephalic and atraumatic.   Eyes:      Conjunctiva/sclera: Conjunctivae normal.      Pupils: Pupils are equal, round, and reactive to light.   Cardiovascular:      Rate and Rhythm: Normal rate and regular rhythm.      Heart sounds: Normal heart sounds. No murmur heard.    No friction rub. No gallop.   Pulmonary:      Effort: No respiratory distress.      Breath sounds: Normal breath sounds and air entry. No wheezing or rales.   Abdominal:      General: Bowel sounds are normal. There is no distension.      Palpations: Abdomen is soft. There is no mass.      Tenderness: There is no abdominal tenderness. There is no guarding or rebound.   Musculoskeletal:         General: No deformity. Normal range of motion.      Cervical back: Normal range of motion and neck supple.   Skin:     General: Skin is warm and dry.      Findings: No erythema or rash.   Neurological:      Mental Status: She is alert and oriented to person, place, and time. Mental status is at baseline.      Cranial Nerves: No cranial nerve deficit.   Psychiatric:  Mood and Affect: Affect normal.         Behavior: Behavior is cooperative.         Cognition and Memory: Cognition and memory normal.         Laboratory  Data:     Results for orders placed or performed during the hospital encounter of 02/23/22 (from the past 24 hour(s))   ECG 12-LEAD   Result Value Ref Range    Ventricular rate 74 BPM    Atrial Rate 94 BPM    QRS Duration 110 ms    QT Interval 406 ms    QTC Calculation 450 ms    Calculated R Axis 20 degrees    Calculated T Axis 29 degrees   COMPREHENSIVE METABOLIC PANEL, NON-FASTING   Result Value Ref Range    SODIUM 143 136 - 145 mmol/L    POTASSIUM 3.9 3.5 - 5.1 mmol/L    CHLORIDE 110 96 - 111 mmol/L    CO2 TOTAL 24 23 - 31 mmol/L    ANION GAP 9 4 - 13 mmol/L    BUN 19 8 - 25 mg/dL    CREATININE 1.00 0.60 - 1.05 mg/dL    BUN/CREA RATIO 19 6 - 22    ESTIMATED GFR 54 (L) >=60 mL/min/BSA    ALBUMIN 3.1 (L) 3.4 - 4.8 g/dL     CALCIUM 10.2 8.8 - 10.2 mg/dL    GLUCOSE 95 65 - 125 mg/dL    ALKALINE PHOSPHATASE 59 55 - 145 U/L    ALT (SGPT) 12 8 - 22 U/L    AST (SGOT)  17 8 - 45 U/L    BILIRUBIN TOTAL 1.1 0.3 - 1.3 mg/dL    PROTEIN TOTAL 6.6 6.0 - 8.0 g/dL   CREATINE KINASE (CK), TOTAL, SERUM   Result Value Ref Range    CREATINE KINASE 62 25 - 190 U/L   TROPONIN-I   Result Value Ref Range    TROPONIN I 27.6 (HH) Female: <=35.0 ng/L; Female: <=14.0 ng/L ng/L   CBC WITH DIFF   Result Value Ref Range    WBC 8.4 3.7 - 11.0 x10^3/uL    RBC 4.05 3.85 - 5.22 x10^6/uL    HGB 11.2 (L) 11.5 - 16.0 g/dL    HCT 37.1 34.8 - 46.0 %    MCV 91.6 78.0 - 100.0 fL    MCH 27.7 26.0 - 32.0 pg    MCHC 30.2 (L) 31.0 - 35.5 g/dL    RDW-CV 17.6 (H) 11.5 - 15.5 %    PLATELETS 254 150 - 400 x10^3/uL    MPV 9.6 8.7 - 12.5 fL    NEUTROPHIL % 68 %    LYMPHOCYTE % 17 %    MONOCYTE % 11 %    EOSINOPHIL % 3 %    BASOPHIL % 1 %    NEUTROPHIL # 5.78 1.50 - 7.70 x10^3/uL    LYMPHOCYTE # 1.42 1.00 - 4.80 x10^3/uL    MONOCYTE # 0.92 0.20 - 1.10 x10^3/uL    EOSINOPHIL # 0.24 <=0.50 x10^3/uL    BASOPHIL # <0.10 <=0.20 x10^3/uL    IMMATURE GRANULOCYTE % 0 0 - 1 %    IMMATURE GRANULOCYTE # <0.10 <0.10 x10^3/uL   BLUE TOP TUBE   Result Value Ref Range     RAINBOW/EXTRA TUBE AUTO RESULT Yes    LIGHT GREEN TOP TUBE   Result Value Ref Range    RAINBOW/EXTRA TUBE AUTO RESULT Yes    LAVENDER TOP TUBE  Result Value Ref Range    RAINBOW/EXTRA TUBE AUTO RESULT Yes    GRAY TOP TUBE   Result Value Ref Range    RAINBOW/EXTRA TUBE AUTO RESULT Yes    TROPONIN-I   Result Value Ref Range    TROPONIN I 28.1 (HH) Female: <=35.0 ng/L; Female: <=14.0 ng/L ng/L   TROPONIN-I   Result Value Ref Range    TROPONIN I 26.7 (HH) Female: <=35.0 ng/L; Female: <=14.0 ng/L ng/L   TROPONIN-I   Result Value Ref Range    TROPONIN I 34.5 (HH) Female: <=35.0 ng/L; Female: <=14.0 ng/L ng/L   TROPONIN-I   Result Value Ref Range    TROPONIN I 31.8 (HH) Female: <=35.0 ng/L; Female: <=14.0 ng/L ng/L       Imaging Studies:    XR AP MOBILE CHEST   Final Result by Edi, Radresults In (04/30 2053)   FINDINGS/IMPRESSION: Heart is enlarged. Central vascular congestion without overt pulmonary edema. Mild bibasilar atelectasis versus pneumonia. Question small left pleural effusion. No visible pneumothorax. Degenerative changes at the shoulders.                        Radiologist location ID: [id number]             Assessment/Plan:  Active Hospital Problems    Diagnosis   . Primary Problem: Chest pain, rule out acute myocardial infarction   . Chronic systolic heart failure (CMS HCC)   . Atherosclerosis of coronary artery   . Atrial fibrillation (CMS HCC)   . Essential hypertension   . Glaucoma   . History of breast cancer   . Hyperlipidemia   . Hypothyroidism       . Chest pain.  Rule out MI.  The patient will be admitted to med surge on telemetry.  We will obtain serial cardiac enzymes q.8 hours for a total 3 sets.  A chest x-ray and lab work will be repeated in the morning.  Will continue to follow.   . Chronic congestive heart failure.  Noted.  Continue medications.    . Atrial fibrillation.  Rate controlled.  Continue Eliquis.    . Hypertension.  Continue home medications  . Glaucoma.  Noted.    . hyperlipidemia.   Lipitor.    . hypothyroidism.  Continue Synthroid.      . DVT prophylaxis.  Patient is on Eliquis.    Bethanie Dicker, MD

## 2022-02-24 NOTE — Nurses Notes (Signed)
Patient taken out per staff via wheelchair. Patient left in stable condition. No incident occurred.

## 2022-02-24 NOTE — Nurses Notes (Signed)
Notified Dr. Crosby Oyster and Wilber Bihari, PA of HR 30-50's A-fib. No new orders at this time.

## 2022-02-26 NOTE — Care Management Notes (Signed)
Referral Information  ++++++ Placed Provider #1 ++++++  Case Manager: Carla Johnson  Provider Type: Home Health  Provider Name: [facility]  Address:  [address]  [address]  [place], [place] [address]  Contact:    Fax:   Fax:

## 2022-03-28 ENCOUNTER — Other Ambulatory Visit (HOSPITAL_COMMUNITY): Payer: Self-pay | Admitting: Family

## 2022-03-28 DIAGNOSIS — I208 Other forms of angina pectoris: Secondary | ICD-10-CM

## 2022-04-11 ENCOUNTER — Ambulatory Visit (INDEPENDENT_AMBULATORY_CARE_PROVIDER_SITE_OTHER): Payer: Self-pay

## 2022-09-02 ENCOUNTER — Encounter (HOSPITAL_COMMUNITY): Payer: Self-pay

## 2022-09-09 ENCOUNTER — Other Ambulatory Visit (HOSPITAL_COMMUNITY): Payer: Self-pay | Admitting: NEPHROLOGY

## 2022-09-09 DIAGNOSIS — I701 Atherosclerosis of renal artery: Secondary | ICD-10-CM

## 2022-09-09 DIAGNOSIS — I129 Hypertensive chronic kidney disease with stage 1 through stage 4 chronic kidney disease, or unspecified chronic kidney disease: Secondary | ICD-10-CM

## 2022-09-09 DIAGNOSIS — N1832 Chronic kidney disease, stage 3b (CMS HCC): Secondary | ICD-10-CM

## 2022-09-26 ENCOUNTER — Inpatient Hospital Stay (HOSPITAL_COMMUNITY)
Admission: RE | Admit: 2022-09-26 | Discharge: 2022-09-26 | Disposition: A | Discharge status: Home / Self Care | Payer: MEDICARE | Source: Ambulatory Visit | Attending: NEPHROLOGY | Admitting: NEPHROLOGY

## 2022-09-26 ENCOUNTER — Inpatient Hospital Stay
Admission: RE | Admit: 2022-09-26 | Discharge: 2022-09-26 | Disposition: A | Discharge status: Home / Self Care | Payer: MEDICARE | Source: Ambulatory Visit | Attending: NEPHROLOGY | Admitting: NEPHROLOGY

## 2022-09-26 ENCOUNTER — Other Ambulatory Visit: Payer: Self-pay

## 2022-09-26 DIAGNOSIS — I129 Hypertensive chronic kidney disease with stage 1 through stage 4 chronic kidney disease, or unspecified chronic kidney disease: Secondary | ICD-10-CM

## 2022-09-26 DIAGNOSIS — N1832 Chronic kidney disease, stage 3b (CMS HCC): Secondary | ICD-10-CM

## 2022-09-26 DIAGNOSIS — I701 Atherosclerosis of renal artery: Secondary | ICD-10-CM

## 2022-09-26 LAB — ABDOMINAL DUPLEX - RENAL (ARTERY AND VEIN)
Left Renal arcuate MID Sys: 30.1 cm/s
Left Renal arcuate Mid dias: 4.1 cm/s
Left renal prox sys: 84.6 cm/s
Mid aortic AP: 2 cm
Right renal dist sys: 62.3 cm/s

## 2022-09-29 LAB — ABDOMINAL DUPLEX - RENAL (ARTERY AND VEIN)
Aortic middle velocity: 94.8 cm/s
Left Renal arcuate Lower Sys: 20.2 cm/s
Left Renal arcuate Lower dias: 4 cm/s
Left Renal arcuate RI: 0.9 cm/s
Left Renal arcuate Upper DIAS: 9.1 cm/s
Left Renal arcuate Upper Sys: 39.4 cm/s
Left kidney length: 10.5 cm
Left renal dist sys: 48.8 cm/s
Left renal mid sys: 74.1 cm/s
Mid aortic trans: 2 cm
Right kidney length: 9.8 cm
Right renal MID arcuate sys: 15.7 cm/s
Right renal arcuate RI: 0.8 cm/s
Right renal lower arcuate dias: 2.8 cm/s
Right renal lower arcuate sys: 12.5 cm/s
Right renal mid arcuate dias: 3.4 cm/s
Right renal mid sys: 122.7 cm/s
Right renal origin sys: 68.2 cm/s
Right renal prox sys: 67.2 cm/s
Right renal upper arcuate dias: 3.1 cm/s
Right renal upper arcuate sys: 10.9 cm/s

## 2022-11-26 ENCOUNTER — Ambulatory Visit (HOSPITAL_COMMUNITY): Payer: Self-pay | Admitting: Nephrology

## 2023-03-16 ENCOUNTER — Emergency Department (HOSPITAL_COMMUNITY): Payer: MEDICARE

## 2023-03-16 ENCOUNTER — Encounter (HOSPITAL_COMMUNITY): Payer: Self-pay

## 2023-03-16 ENCOUNTER — Other Ambulatory Visit: Payer: Self-pay

## 2023-03-16 ENCOUNTER — Emergency Department
Admission: EM | Admit: 2023-03-16 | Discharge: 2023-03-16 | Disposition: A | Discharge status: Home / Self Care | Payer: MEDICARE | Attending: Emergency Medicine | Admitting: Emergency Medicine

## 2023-03-16 DIAGNOSIS — I110 Hypertensive heart disease with heart failure: Secondary | ICD-10-CM | POA: Insufficient documentation

## 2023-03-16 DIAGNOSIS — Z79899 Other long term (current) drug therapy: Secondary | ICD-10-CM | POA: Insufficient documentation

## 2023-03-16 DIAGNOSIS — Z7901 Long term (current) use of anticoagulants: Secondary | ICD-10-CM | POA: Insufficient documentation

## 2023-03-16 DIAGNOSIS — I252 Old myocardial infarction: Secondary | ICD-10-CM | POA: Insufficient documentation

## 2023-03-16 DIAGNOSIS — I4891 Unspecified atrial fibrillation: Secondary | ICD-10-CM | POA: Insufficient documentation

## 2023-03-16 DIAGNOSIS — I5043 Acute on chronic combined systolic (congestive) and diastolic (congestive) heart failure: Secondary | ICD-10-CM | POA: Insufficient documentation

## 2023-03-16 LAB — CBC WITH DIFF
BASOPHIL #: <0.1 10*3/uL (ref <=0.20)
BASOPHIL %: 1 %
EOSINOPHIL #: 0.26 10*3/uL (ref <=0.50)
EOSINOPHIL %: 3 %
HCT: 37.6 % (ref 34.8–46.0)
HGB: 12.1 g/dL (ref 11.5–16.0)
IMMATURE GRANULOCYTE #: <0.1 10*3/uL (ref <0.10)
IMMATURE GRANULOCYTE %: 0 % (ref 0.0–1.0)
LYMPHOCYTE #: 1.42 10*3/uL (ref 1.00–4.80)
LYMPHOCYTE %: 18 %
MCH: 30 pg (ref 26.0–32.0)
MCHC: 32.2 g/dL (ref 31.0–35.5)
MCV: 93.1 fL (ref 78.0–100.0)
MONOCYTE #: 0.9 10*3/uL (ref 0.20–1.10)
MONOCYTE %: 12 %
MPV: 9.1 fL (ref 8.7–12.5)
NEUTROPHIL #: 5.15 10*3/uL (ref 1.50–7.70)
NEUTROPHIL %: 66 %
PLATELETS: 227 10*3/uL (ref 150–400)
RBC: 4.04 10*6/uL (ref 3.85–5.22)
RDW-CV: 16.3 % — ABNORMAL HIGH (ref 11.5–15.5)
WBC: 7.8 10*3/uL (ref 3.7–11.0)

## 2023-03-16 LAB — COMPREHENSIVE METABOLIC PANEL, NON-FASTING
ALBUMIN: 2.9 g/dL — ABNORMAL LOW (ref 3.4–4.8)
ALKALINE PHOSPHATASE: 59 U/L (ref 55–145)
ALT (SGPT): 9 U/L (ref 8–22)
ANION GAP: 11 mmol/L (ref 4–13)
AST (SGOT): 16 U/L (ref 8–45)
BILIRUBIN TOTAL: 1.7 mg/dL — ABNORMAL HIGH (ref 0.3–1.3)
BUN/CREA RATIO: 15 (ref 6–22)
BUN: 18 mg/dL (ref 8–25)
CALCIUM: 10.2 mg/dL (ref 8.6–10.3)
CHLORIDE: 107 mmol/L (ref 96–111)
CO2 TOTAL: 27 mmol/L (ref 23–31)
CREATININE: 1.24 mg/dL — ABNORMAL HIGH (ref 0.60–1.05)
ESTIMATED GFR - FEMALE: 41 mL/min/BSA — ABNORMAL LOW (ref >=60)
GLUCOSE: 102 mg/dL (ref 65–125)
POTASSIUM: 3.3 mmol/L — ABNORMAL LOW (ref 3.5–5.1)
PROTEIN TOTAL: 6.5 g/dL (ref 6.0–8.0)
SODIUM: 145 mmol/L (ref 136–145)

## 2023-03-16 LAB — GOLD TOP TUBE

## 2023-03-16 LAB — BNP, LAB ISTAT: BNP, POC: 1744 — ABNORMAL HIGH (ref <=100)

## 2023-03-16 LAB — MAGNESIUM: MAGNESIUM: 1.8 mg/dL (ref 1.8–2.6)

## 2023-03-16 LAB — PTT (PARTIAL THROMBOPLASTIN TIME): APTT: 36.4 seconds (ref 25.1–36.5)

## 2023-03-16 LAB — TROPONIN-I: TROPONIN-I HS: 41.7 ng/L (ref <=14.0)

## 2023-03-16 LAB — GRAY TOP TUBE

## 2023-03-16 MED ORDER — BUMETANIDE 0.25 MG/ML INJECTION SOLUTION
2.0000 mg | INTRAMUSCULAR | Status: AC
Start: 2023-03-16 — End: 2023-03-16
  Administered 2023-03-16: 2 mg via INTRAVENOUS
  Filled 2023-03-16: qty 10

## 2023-03-16 NOTE — ED Nurses Note (Signed)
Dr Keen in to examine patient.

## 2023-03-16 NOTE — ED Nurses Note (Signed)
To ED7 via [facility]. Gown on. Monitor applied. Side rails up x 2. Call bell provided.

## 2023-03-16 NOTE — ED Triage Notes (Addendum)
Shortness of breath on exertion x 2 days.   Denies pain or fever.

## 2023-03-16 NOTE — ED Nurses Note (Signed)
EKG per RT.

## 2023-03-16 NOTE — ED Nurses Note (Addendum)
Torsemide dose clarified per patient. Patient states she has been taking total of 30 mg daily. Dr Randel Pigg notified. Instructs patient to take total of 60 mg po daily x 5 days. Changes noted on discharge instructions and daughter voiced understanding. Patient discharged home. AVS reviewed with patient/caregiver. A written copy of discharge instructions was provided.  Questions sufficiently answered as needed. Patient encouraged to follow up with PCP as indicated. In the event of an emergency, patient instructed to call 911 or go to the nearest emergency room. Alert. Oriented. Condition stable. To vehicle via wheelchair. No complaints voiced.   Total urine output 1000 ml.

## 2023-03-16 NOTE — ED Nurses Note (Signed)
ID/allergies confirmed. Meds as per orders.

## 2023-03-16 NOTE — ED Provider Notes (Signed)
Emergency Department  [facility]   03/16/2023     [name]  [birth year]  89 y.o.  female  [place] [address]   [phone] (home)  PCP: Marcelino Freestone, FNP   Date of service:03/16/2023 13:54    Chief Complaint:   Chief Complaint   Patient presents with    Shortness of Breath           HPI: This is a 89 y.o. female who presents to the emergency department complaining of several days of shortness of breath with exertion.  Denies any chest pain, squeezing, tightness, heaviness.  Patient denies any abdominal pain, nausea, vomiting.  Shortness of breath is improved with rest.  Does have a history of an MI in the past with coronary stent.  Is on diuretics and Eliquis.  Last echocardiogram from 12/22 showed an EF of 45% as well as moderate diastolic dysfunction consistent with heart failure.                Past Medical History:   Past Medical History:   Diagnosis Date    Arthropathy     Cancer (CMS HCC)     breast    Heart murmur     HTN (hypertension)     MI (myocardial infarction) (CMS HCC)     Thyroid disease      (Not in an outpatient encounter)     Past Surgical History:   Past Surgical History:   Procedure Laterality Date    Hx coronary stent placement      Hx lymph node dissection Right        Social History:   Social History     Tobacco Use    Smoking status: Never    Smokeless tobacco: Never   Vaping Use    Vaping status: Never Used   Substance Use Topics    Alcohol use: Never    Drug use: Never        Family History:  No family history on file.  Prior to Admission medications    Medication Sig Start Date End Date Taking? Authorizing Provider   apixaban (ELIQUIS) 5 mg Oral Tablet Take 1 Tablet (5 mg total) by mouth Twice daily 03/01/18   Provider, Historical   aspirin (ECOTRIN) 81 mg Oral Tablet, Delayed Release (E.C.) Take 1 Tablet (81 mg total) by mouth Once a day    Provider, Historical   carvediloL (COREG) 3.125 mg Oral Tablet Take 1 Tablet (3.125 mg total) by mouth Twice daily  with food 02/24/22   Leonarda Salon, MD   COMBIGAN 0.2-0.5 % Ophthalmic Drops INSTILL 1 DROP IN BOTH EYES TWICE DAILY 09/03/21   Provider, Historical   cyanocobalamin (VITAMIN B-12) 500 mcg Oral Tablet Take 1 Tablet (500 mcg total) by mouth Once a day    Provider, Historical   ferrous sulfate (FEOSOL) 325 mg (65 mg iron) Oral Tablet, Delayed Release (E.C.) Take 1 Tablet (325 mg total) by mouth 04/03/17   Provider, Historical   isosorbide mononitrate (IMDUR) 30 mg Oral Tablet Sustained Release 24 hr Take 1 Tablet (30 mg total) by mouth Every morning 02/24/22   Leonarda Salon, MD   levothyroxine (SYNTHROID) 125 mcg Oral Tablet Take 1 Tablet (125 mcg total) by mouth Once a day 09/24/21   Provider, Historical   losartan (COZAAR) 25 mg Oral Tablet Take 0.5 Tablets (12.5 mg total) by mouth 12/24/17   Provider, Historical   Multivitamins with Fluoride (MULTI-VITAMIN PO) Take by mouth  Provider, Historical   potassium chloride (K-DUR) 20 mEq Oral Tab Sust.Rel. Particle/Crystal Take 2 Tablets (40 mEq total) by mouth Twice daily 09/12/21   Provider, Historical   simvastatin (ZOCOR) 40 mg Oral Tablet Take 1 Tablet (40 mg total) by mouth Once a day 08/08/21   Provider, Historical   torsemide (DEMADEX) 20 mg Oral Tablet Take by mouth Once a day 09/12/21   Provider, Historical          Allergies: No Known Allergies    Above history reviewed with patient.  Allergies, medication list, reviewed.          Physical Exam  Body mass index is 29.95 kg/m.  ED Triage Vitals [03/16/23 1349]   BP (Non-Invasive) (!) 192/93   Heart Rate 63   Respiratory Rate (!) 22   Temperature 36.8 C (98.2 F)   SpO2 98 %   Weight 81.6 kg (180 lb)   Height 1.651 m (5\' 5" )     Constitutional: patient is oriented to person, place, and time and well-developed, well-nourished, and in no distress.   HENT:   Head: Normocephalic and atraumatic.   Right Ear: External ear normal.   Left Ear: External ear normal.   Nose: Nose normal.   Mouth/Throat: Oropharynx is  clear and moist.   Eyes: Pupils are equal, round, and reactive to light. Conjunctivae and EOM are normal.   Neck: Normal range of motion. Neck supple.   Cardiovascular:  Irregularly irregular, normal heart sounds and intact distal pulses.  2/6 systolic murmur  Pulmonary/Chest: Effort normal and breath sounds normal.   Abdominal: Soft. Bowel sounds are normal.   Musculoskeletal: Normal range of motion.  Two to 3+ pretibial edema bilaterally  Neurological:Patient is alert and oriented to person, place, and time.  GCS score is 15.   Skin: Skin is warm and dry.   Psychiatric: Mood, memory, affect and judgment normal.    The following orders were placed after examining the patient :  Orders Placed This Encounter    XR AP MOBILE CHEST    CBC/DIFF    COMPREHENSIVE METABOLIC PANEL, NON-FASTING    MAGNESIUM    CANCELED: B-TYPE NATRIURETIC PEPTIDE    PTT (PARTIAL THROMBOPLASTIN TIME)    TROPONIN-I    CBC WITH DIFF    BNP, LAB ISTAT    EXTRA TUBES    GOLD TOP TUBE    GOLD TOP TUBE    GRAY TOP TUBE    BASIC METABOLIC PANEL    ECG 12-LEAD    INSERT & MAINTAIN PERIPHERAL IV ACCESS    bumetanide (BUMEX) 0.25 mg/mL injection      XR AP MOBILE CHEST   Final Result   Cardiomegaly with interstitial pulmonary edema pattern and suspected small bilateral pleural effusions.                        Radiologist location ID: [id number]            Results for orders placed or performed during the hospital encounter of 03/16/23 (from the past 12 hour(s))   COMPREHENSIVE METABOLIC PANEL, NON-FASTING   Result Value Ref Range    SODIUM 145 136 - 145 mmol/L    POTASSIUM 3.3 (L) 3.5 - 5.1 mmol/L    CHLORIDE 107 96 - 111 mmol/L    CO2 TOTAL 27 23 - 31 mmol/L    ANION GAP 11 4 - 13 mmol/L    BUN 18 8 - 25 mg/dL  CREATININE 1.24 (H) 0.60 - 1.05 mg/dL    BUN/CREA RATIO 15 6 - 22    ALBUMIN 2.9 (L) 3.4 - 4.8 g/dL     CALCIUM 06.3 8.6 - 10.3 mg/dL    GLUCOSE 016 65 - 010 mg/dL    ALKALINE PHOSPHATASE 59 55 - 145 U/L    ALT (SGPT) 9 8 - 22 U/L    AST  (SGOT)  16 8 - 45 U/L    BILIRUBIN TOTAL 1.7 (H) 0.3 - 1.3 mg/dL    PROTEIN TOTAL 6.5 6.0 - 8.0 g/dL    ESTIMATED GFR - FEMALE 41 (L) >=60 mL/min/BSA   MAGNESIUM   Result Value Ref Range    MAGNESIUM 1.8 1.8 - 2.6 mg/dL   TROPONIN-I   Result Value Ref Range    TROPONIN-I HS 41.7 (HH) <=14.0 ng/L ng/L   CBC WITH DIFF   Result Value Ref Range    WBC 7.8 3.7 - 11.0 x10^3/uL    RBC 4.04 3.85 - 5.22 x10^6/uL    HGB 12.1 11.5 - 16.0 g/dL    HCT 93.2 35.5 - 73.2 %    MCV 93.1 78.0 - 100.0 fL    MCH 30.0 26.0 - 32.0 pg    MCHC 32.2 31.0 - 35.5 g/dL    RDW-CV 20.2 (H) 54.2 - 15.5 %    PLATELETS 227 150 - 400 x10^3/uL    MPV 9.1 8.7 - 12.5 fL    NEUTROPHIL % 66.0 %    LYMPHOCYTE % 18.0 %    MONOCYTE % 12.0 %    EOSINOPHIL % 3.0 %    BASOPHIL % 1.0 %    NEUTROPHIL # 5.15 1.50 - 7.70 x10^3/uL    LYMPHOCYTE # 1.42 1.00 - 4.80 x10^3/uL    MONOCYTE # 0.90 0.20 - 1.10 x10^3/uL    EOSINOPHIL # 0.26 <=0.50 x10^3/uL    BASOPHIL # <0.10 <=0.20 x10^3/uL    IMMATURE GRANULOCYTE % 0.0 0.0 - 1.0 %    IMMATURE GRANULOCYTE # <0.10 <0.10 x10^3/uL   PTT (PARTIAL THROMBOPLASTIN TIME)   Result Value Ref Range    APTT 36.4 25.1 - 36.5 seconds   BNP, LAB ISTAT   Result Value Ref Range    BNP, POC 1,744 (H) <=100         ED Course:   ED Course as of 03/16/23 1546   Mon Mar 16, 2023   1421 XR AP MOBILE CHEST  X-ray personally reviewed by me shows heart failure   1435 BNP, LAB ISTAT(!)  Elevated   1435 MAGNESIUM  Normal   1435 COMPREHENSIVE METABOLIC PANEL, NON-FASTING(!)  Mild hypokalemia   1540 TROPONIN-I(!!)  Chronically elevated   1540 COMPREHENSIVE METABOLIC PANEL, NON-FASTING(!)  Stable, low albumin      Medications Administered in the ED   bumetanide (BUMEX) 0.25 mg/mL injection (2 mg Intravenous Given 03/16/23 1425)        Emergency Department Procedure:    EKG Interpertation:  Atrial fibrillation at 68 beats per minute with normal QRS and QT intervals.  Normal axis.  No ST elevation or depression.  Procedures    Medical Decision  Making  Differential includes heart failure, pneumonia, COPD, bronchitis    Problems Addressed:  Acute on chronic combined systolic and diastolic congestive heart failure (CMS HCC): acute illness or injury     Details: Patient responding to IV Bumex.  Saturations are 95% on room air.  Will discharge with double her dose of Demadex for the next  5 days with repeat labs in 2 days.    Amount and/or Complexity of Data Reviewed  Labs: ordered. Decision-making details documented in ED Course.  Radiology: ordered and independent interpretation performed. Decision-making details documented in ED Course.  ECG/medicine tests: ordered and independent interpretation performed. Decision-making details documented in ED Course.    Risk  Prescription drug management.            Findings and diagnosis discussed with patient.  Clinical Impression:   Clinical Impression   Acute on chronic combined systolic and diastolic congestive heart failure (CMS HCC) (Primary)                   Disposition: Discharged          No follow-ups on file.   New Prescriptions    No medications on file      Marcelino Freestone, FNP  [address]  [place] [address]  [phone]           BP (!) 195/78   Pulse 67   Temp 36.8 C (98.2 F)   Resp (!) 24   Ht 1.651 m (5\' 5" )   Wt 81.6 kg (180 lb)   SpO2 95%   BMI 29.95 kg/m        Odis Hollingshead, MD5/20/2024              This note was partially created using voice recognition software and is inherently subject to errors including those of syntax and "sound alike " substitutions which may escape proof reading.  In such instances, original meaning may be extrapolated by contextual derivation.

## 2023-03-16 NOTE — Discharge Instructions (Signed)
Double your torsemide to 40 mg daily (2 pills) for the next 5 days.  Get repeat lab work in 2 days.

## 2023-03-16 NOTE — ED Nurses Note (Signed)
20g PIV to LAC x 1 attempt. + blood return. Labs drawn. Flushed easily.

## 2023-03-16 NOTE — ED Nurses Note (Signed)
Portable CXR per tech.

## 2023-03-17 LAB — ECG 12-LEAD
Atrial Rate: 66 {beats}/min
Calculated R Axis: -7 degrees
Calculated T Axis: 16 degrees
QRS Duration: 108 ms
QT Interval: 404 ms
QTC Calculation: 429 ms
Ventricular rate: 68 {beats}/min

## 2023-06-22 ENCOUNTER — Other Ambulatory Visit: Payer: Self-pay

## 2023-06-22 ENCOUNTER — Emergency Department
Admission: EM | Admit: 2023-06-22 | Discharge: 2023-06-22 | Disposition: A | Discharge status: Home / Self Care | Payer: MEDICARE | Attending: Emergency Medical Services | Admitting: Emergency Medical Services

## 2023-06-22 ENCOUNTER — Emergency Department (HOSPITAL_COMMUNITY): Payer: MEDICARE

## 2023-06-22 ENCOUNTER — Encounter (HOSPITAL_COMMUNITY): Payer: Self-pay

## 2023-06-22 DIAGNOSIS — R7989 Other specified abnormal findings of blood chemistry: Secondary | ICD-10-CM | POA: Insufficient documentation

## 2023-06-22 DIAGNOSIS — R0600 Dyspnea, unspecified: Secondary | ICD-10-CM | POA: Insufficient documentation

## 2023-06-22 DIAGNOSIS — Z7901 Long term (current) use of anticoagulants: Secondary | ICD-10-CM | POA: Insufficient documentation

## 2023-06-22 DIAGNOSIS — J811 Chronic pulmonary edema: Secondary | ICD-10-CM | POA: Insufficient documentation

## 2023-06-22 DIAGNOSIS — U071 COVID-19: Secondary | ICD-10-CM | POA: Insufficient documentation

## 2023-06-22 DIAGNOSIS — Z79899 Other long term (current) drug therapy: Secondary | ICD-10-CM | POA: Insufficient documentation

## 2023-06-22 DIAGNOSIS — I482 Chronic atrial fibrillation, unspecified: Secondary | ICD-10-CM | POA: Insufficient documentation

## 2023-06-22 DIAGNOSIS — I110 Hypertensive heart disease with heart failure: Secondary | ICD-10-CM | POA: Insufficient documentation

## 2023-06-22 DIAGNOSIS — I5022 Chronic systolic (congestive) heart failure: Secondary | ICD-10-CM | POA: Insufficient documentation

## 2023-06-22 LAB — CBC WITH DIFF
BASOPHIL #: 0 10*3/uL (ref 0.00–0.20)
BASOPHIL %: 1 % (ref 0–2)
EOSINOPHIL #: 0.2 10*3/uL (ref 0.00–4.00)
EOSINOPHIL %: 3 % (ref 0–4)
HCT: 35.2 % — ABNORMAL LOW (ref 36.0–47.0)
HGB: 11.4 g/dL — ABNORMAL LOW (ref 12.0–15.0)
LYMPHOCYTE #: 1.3 10*3/uL (ref 1.00–3.80)
LYMPHOCYTE %: 22 % (ref 20–35)
MCH: 30.1 pg (ref 27.0–32.0)
MCHC: 32.4 g/dL (ref 32.0–36.0)
MCV: 92.8 fL (ref 80.0–100.0)
MONOCYTE #: 0.5 10*3/uL (ref 0.00–1.40)
MONOCYTE %: 9 % (ref 3–13)
MPV: 7.5 fL (ref 6.6–9.3)
NEUTROPHIL #: 3.9 10*3/uL (ref 2.40–7.60)
NEUTROPHIL %: 65 % (ref 50–70)
PLATELETS: 204 10*3/uL (ref 140–450)
RBC: 3.79 10*6/uL — ABNORMAL LOW (ref 4.20–5.40)
RDW: 17 % — ABNORMAL HIGH (ref 12.0–15.0)
WBC: 6 10*3/uL (ref 4.8–10.8)

## 2023-06-22 LAB — BASIC METABOLIC PANEL
ANION GAP: 12 mmol/L (ref 4–13)
BUN/CREA RATIO: 15 (ref 6–22)
BUN: 16 mg/dL (ref 8–25)
CALCIUM: 10.1 mg/dL (ref 8.6–10.3)
CHLORIDE: 111 mmol/L (ref 96–111)
CO2 TOTAL: 23 mmol/L (ref 23–31)
CREATININE: 1.07 mg/dL — ABNORMAL HIGH (ref 0.60–1.05)
ESTIMATED GFR - FEMALE: 49 mL/min/BSA — ABNORMAL LOW (ref >=60)
GLUCOSE: 99 mg/dL (ref 65–125)
POTASSIUM: 3.5 mmol/L (ref 3.5–5.1)
SODIUM: 146 mmol/L — ABNORMAL HIGH (ref 136–145)

## 2023-06-22 LAB — TROPONIN-I: TROPONIN-I HS: 42.3 ng/L (ref <=14.0)

## 2023-06-22 LAB — ECG 12-LEAD
Calculated T Axis: 207 degrees
QT Interval: 426 ms
QTC Calculation: 399 ms
Ventricular rate: 53 {beats}/min

## 2023-06-22 LAB — B-TYPE NATRIURETIC PEPTIDE (BNP),PLASMA: BNP: 2177 pg/mL — ABNORMAL HIGH (ref <=99)

## 2023-06-22 LAB — MAGNESIUM: MAGNESIUM: 2 mg/dL (ref 1.8–2.6)

## 2023-06-22 MED ORDER — PAXLOVID 300 MG (150 MG X 2)-100 MG TABLETS IN A DOSE PACK
1.0000 | ORAL_TABLET | Freq: Two times a day (BID) | ORAL | 0 refills | Status: AC
Start: 2023-06-22 — End: 2023-06-27

## 2023-06-22 MED ORDER — TORSEMIDE 20 MG TABLET
40.0000 mg | ORAL_TABLET | Freq: Every day | ORAL | 0 refills | Status: DC
Start: 2023-06-22 — End: 2023-08-12

## 2023-06-22 MED ORDER — FUROSEMIDE 40 MG TABLET
80.0000 mg | ORAL_TABLET | ORAL | Status: AC
Start: 2023-06-22 — End: 2023-06-22
  Administered 2023-06-22: 80 mg via ORAL
  Filled 2023-06-22: qty 2

## 2023-06-22 MED ORDER — FUROSEMIDE 10 MG/ML INJECTION SOLUTION
40.0000 mg | INTRAMUSCULAR | Status: DC
Start: 2023-06-22 — End: 2023-06-22

## 2023-06-22 NOTE — ED Nurses Note (Signed)
No SOB noted   Lungs clear/diminished   NSR on monitor   Productive cough    Assisted pt to car via wc  PT/Family member verbalized understanding of dc instructions    Left with AVS

## 2023-06-22 NOTE — Discharge Instructions (Addendum)
Take 1/2 of your blood thinner or Eliquis twice a day  Stop the Zocor or simvastatin while you were on the Paxlovid or anti COVID medication and do not take it until a week after you have finished the Paxlovid  Notice I have increased your dose of Demadex to 2 mg daily.  The old dose

## 2023-06-22 NOTE — ED Triage Notes (Signed)
89 yo female dx with COVID presents to ED with weakness, leg swelling and SOB

## 2023-06-22 NOTE — ED Attending Note (Signed)
Department of Emergency Medicine  [facility]  06/22/2023        Patient is a 89 y.o.  female presenting to the ED with chief complaint of weakness leg swelling and shortness of breath associated with a recent diagnosis of COVID.  Her current medications include Eliquis Ecotrin Coreg Isordil therapeutic eyedrops Synthroid Cozaar potassium Zocor Demadex.  Her medical problems include chest pain atrial fibrillation coronary artery disease chronic systolic heart failure essential hypertension hyperlipidemia hypothyroidism and history of breast cancer at this time her EKG reveals low voltage and a slow ventricular response she is on no meds that should slow her heart rate    Review of Systems:    Constitutional: No fever, chills  Skin: No rashes or lesions  HENT: No sore throat, ear pain, or difficulty swallowing  Eyes: No vision changes, redness, discharge  Cardio: No chest pain, palpitations   Respiratory: No cough, wheezing or SOB  GI:  No nausea or vomiting. No diarrhea or constipation. No abdominal pain  GU:  No dysuria, hematuria, polyuria  MSK: No joint pain.  No neck or back pain  Neuro: No numbness, tingling, or weakness.  No headache  Psych: No SI or HI. Normal mood    Past Medical History:  Past Medical History:   Diagnosis Date    Arthropathy     Cancer (CMS HCC)     breast    Heart murmur     HTN (hypertension)     MI (myocardial infarction) (CMS HCC)     Thyroid disease        Past Surgical History:  Past Surgical History:   Procedure Laterality Date    Hx coronary stent placement      Hx lymph node dissection Right        Social History:  Social History     Tobacco Use    Smoking status: Never    Smokeless tobacco: Never   Vaping Use    Vaping status: Never Used   Substance Use Topics    Alcohol use: Never    Drug use: Never     Social History     Substance and Sexual Activity   Drug Use Never       Family History:  No family history on file.    Allergies, and old records also  reviewed.     Filed Vitals:    06/22/23 2103   BP: (!) 160/65   Pulse: 64   Resp: 18   Temp: 36.5 C (97.7 F)   SpO2: 94%       Physical Exam:     Nursing note and vitals reviewed.  Vital signs reviewed as above. No acute distress.  Patient is lying in bed without dyspnea carrying on conversation without difficulty an anxious to go home  Constitutional: Pt is well-developed and well-nourished.   Head: Normocephalic and atraumatic.   Eyes: Conjunctivae are normal. Pupils are equal, round, and reactive to light. EOM are intact  Neck: Soft, supple, full range of motion.  Pulmonary/Chest: No respiratory denies shortness breath at this time with respirations at 18  Musculoskeletal: Normal range of motion. No deformities.    Neurological: CNs 2-12 grossly intact.  No focal deficits noted.  Skin: No rash or lesions  Psychiatric: Patient has a normal mood and affect.     Workup:     Labs:  Results for orders placed or performed during the hospital encounter of 06/22/23 (from the past 24 hour(s))  CBC/DIFF    Narrative    The following orders were created for panel order CBC/DIFF.  Procedure                               Abnormality         Status                     ---------                               -----------         ------                     CBC WITH [id number]]                Abnormal            Final result                 Please view results for these tests on the individual orders.   BASIC METABOLIC PANEL   Result Value Ref Range    SODIUM 146 (H) 136 - 145 mmol/L    POTASSIUM 3.5 3.5 - 5.1 mmol/L    CHLORIDE 111 96 - 111 mmol/L    CO2 TOTAL 23 23 - 31 mmol/L    ANION GAP 12 4 - 13 mmol/L    CALCIUM 10.1 8.6 - 10.3 mg/dL    GLUCOSE 99 65 - 295 mg/dL    BUN 16 8 - 25 mg/dL    CREATININE 1.88 (H) 0.60 - 1.05 mg/dL    BUN/CREA RATIO 15 6 - 22    ESTIMATED GFR - FEMALE 49 (L) >=60 mL/min/BSA   B-TYPE NATRIURETIC PEPTIDE (BNP),PLASMA   Result Value Ref Range    BNP 2,177 (H) <=99 pg/mL   TROPONIN-I   Result  Value Ref Range    TROPONIN-I HS 42.3 (HH) <=14.0 ng/L ng/L   CBC WITH DIFF   Result Value Ref Range    WBC 6.0 4.8 - 10.8 x10^3/uL    RBC 3.79 (L) 4.20 - 5.40 x10^6/uL    HGB 11.4 (L) 12.0 - 15.0 g/dL    HCT 41.6 (L) 60.6 - 47.0 %    MCV 92.8 80.0 - 100.0 fL    MCH 30.1 27.0 - 32.0 pg    MCHC 32.4 32.0 - 36.0 g/dL    RDW 30.1 (H) 60.1 - 15.0 %    PLATELETS 204 140 - 450 x10^3/uL    MPV 7.5 6.6 - 9.3 fL    NEUTROPHIL % 65 50 - 70 %    LYMPHOCYTE % 22 20 - 35 %    MONOCYTE % 9 3 - 13 %    EOSINOPHIL % 3 0 - 4 %    BASOPHIL % 1 0 - 2 %    NEUTROPHIL # 3.90 2.40 - 7.60 x10^3/uL    LYMPHOCYTE # 1.30 1.00 - 3.80 x10^3/uL    MONOCYTE # 0.50 0.00 - 1.40 x10^3/uL    EOSINOPHIL # 0.20 0.00 - 4.00 x10^3/uL    BASOPHIL # 0.00 0.00 - 0.20 x10^3/uL   MAGNESIUM   Result Value Ref Range    MAGNESIUM 2.0 1.8 - 2.6 mg/dL       Imaging:    Results for orders placed or performed during the hospital encounter of 06/22/23 (from  the past 72 hour(s))   XR AP MOBILE CHEST     Status: None    Narrative    [place] [facility]    PROCEDURE DESCRIPTION: XR AP MOBILE CHEST    CLINICAL INDICATION: SOB    Portable AP chest view compared with x-ray on 03/16/23.    FINDINGS:  There is mild airspace disease bilaterally mostly in the lower lobes associated with pleural effusions suggestive of pulmonary edema. This is slightly less extensive than seen previously. Heart size is enlarged.      Impression    Findings suggestive of pulmonary edema.      Radiologist location ID: [id number]         Orders Placed This Encounter    XR AP MOBILE CHEST    CBC/DIFF    BASIC METABOLIC PANEL    B-TYPE NATRIURETIC PEPTIDE (BNP),PLASMA    TROPONIN-I    CBC WITH DIFF    MAGNESIUM    ECG 12-LEAD       Abnormal Lab results:  Labs Ordered/Reviewed   BASIC METABOLIC PANEL - Abnormal; Notable for the following components:       Result Value    SODIUM 146 (*)     CREATININE 1.07 (*)     ESTIMATED GFR - FEMALE 49 (*)     All other components within normal limits   B-TYPE  NATRIURETIC PEPTIDE (BNP),PLASMA - Abnormal; Notable for the following components:    BNP 2,177 (*)     All other components within normal limits   TROPONIN-I - Abnormal; Notable for the following components:    TROPONIN-I HS 42.3 (*)     All other components within normal limits   CBC WITH DIFF - Abnormal; Notable for the following components:    RBC 3.79 (*)     HGB 11.4 (*)     HCT 35.2 (*)     RDW 17.0 (*)     All other components within normal limits   MAGNESIUM - Normal   CBC/DIFF    Narrative:     The following orders were created for panel order CBC/DIFF.  Procedure                               Abnormality         Status                     ---------                               -----------         ------                     CBC WITH [id number]]                Abnormal            Final result                 Please view results for these tests on the individual orders.         Plan: Appropriate labs and imaging ordered. Medical Records reviewed.    MDM:  Medical Decision Making  Amount and/or Complexity of Data Reviewed  Labs: ordered.  Radiology: ordered.  ECG/medicine tests: ordered.       During the patient's stay in the emergency department,  the above listed imaging and/or labs were performed to assist with medical decision making and were reviewed by myself when available for review.     Pt remained stable throughout the emergency department course.      Impression:  COVID 19 with symptoms of dyspnea  Congestive heart failure with pulmonary edema and elevated troponin    Plan:  Initiate Paxlovid at the 150 mg lower dose as her estimated GFR is around 40-50 other medication changes cut her Eliquis to a half tablet twice a day while she is on the Paxlovid also home this Zocor while she is on Paxlovid and keep her off the Zocor for 5 more days after the Paxlovid is done  Increase her Bumex to 2 mg daily  Follow-up with her PCP if she does not improve  Maryan Puls, MD

## 2023-06-23 ENCOUNTER — Telehealth (HOSPITAL_COMMUNITY): Payer: Self-pay

## 2023-06-23 LAB — ECG 12-LEAD
Calculated R Axis: -17 degrees
QRS Duration: 112 ms

## 2023-06-23 NOTE — Nursing Note (Signed)
Spoke with patient. States " I am doing alright. My daughter just left to pick up my prescriptions".  Encouraged patient to return immediately if feeling worse or any concerns.

## 2023-08-04 ENCOUNTER — Other Ambulatory Visit (HOSPITAL_COMMUNITY): Payer: Self-pay | Admitting: NURSE PRACTITIONER

## 2023-08-04 ENCOUNTER — Other Ambulatory Visit (INDEPENDENT_AMBULATORY_CARE_PROVIDER_SITE_OTHER): Payer: Self-pay | Admitting: NURSE PRACTITIONER

## 2023-08-04 DIAGNOSIS — I509 Heart failure, unspecified: Secondary | ICD-10-CM

## 2023-08-04 DIAGNOSIS — I6521 Occlusion and stenosis of right carotid artery: Secondary | ICD-10-CM

## 2023-08-06 ENCOUNTER — Other Ambulatory Visit: Payer: Self-pay

## 2023-08-06 ENCOUNTER — Inpatient Hospital Stay
Admission: RE | Admit: 2023-08-06 | Discharge: 2023-08-06 | Disposition: A | Discharge status: Home / Self Care | Payer: MEDICARE | Source: Ambulatory Visit | Attending: NURSE PRACTITIONER | Admitting: NURSE PRACTITIONER

## 2023-08-06 ENCOUNTER — Ambulatory Visit (INDEPENDENT_AMBULATORY_CARE_PROVIDER_SITE_OTHER): Payer: Self-pay

## 2023-08-06 DIAGNOSIS — I509 Heart failure, unspecified: Secondary | ICD-10-CM | POA: Insufficient documentation

## 2023-08-06 DIAGNOSIS — I4891 Unspecified atrial fibrillation: Secondary | ICD-10-CM | POA: Insufficient documentation

## 2023-08-11 ENCOUNTER — Encounter (HOSPITAL_COMMUNITY): Payer: Self-pay

## 2023-08-11 ENCOUNTER — Observation Stay (HOSPITAL_COMMUNITY): Payer: MEDICARE | Admitting: Student in an Organized Health Care Education/Training Program

## 2023-08-11 ENCOUNTER — Emergency Department (HOSPITAL_COMMUNITY): Payer: MEDICARE

## 2023-08-11 ENCOUNTER — Observation Stay
Admission: EM | Admit: 2023-08-11 | Discharge: 2023-08-12 | Disposition: A | Discharge status: Home / Self Care | Payer: MEDICARE | Attending: Student in an Organized Health Care Education/Training Program | Admitting: Student in an Organized Health Care Education/Training Program

## 2023-08-11 ENCOUNTER — Observation Stay (HOSPITAL_BASED_OUTPATIENT_CLINIC_OR_DEPARTMENT_OTHER): Payer: MEDICARE

## 2023-08-11 ENCOUNTER — Other Ambulatory Visit: Payer: Self-pay

## 2023-08-11 DIAGNOSIS — I110 Hypertensive heart disease with heart failure: Principal | ICD-10-CM | POA: Insufficient documentation

## 2023-08-11 DIAGNOSIS — I480 Paroxysmal atrial fibrillation: Secondary | ICD-10-CM | POA: Insufficient documentation

## 2023-08-11 DIAGNOSIS — I10 Essential (primary) hypertension: Secondary | ICD-10-CM

## 2023-08-11 DIAGNOSIS — I350 Nonrheumatic aortic (valve) stenosis: Secondary | ICD-10-CM | POA: Insufficient documentation

## 2023-08-11 DIAGNOSIS — R2689 Other abnormalities of gait and mobility: Secondary | ICD-10-CM | POA: Insufficient documentation

## 2023-08-11 DIAGNOSIS — R011 Cardiac murmur, unspecified: Secondary | ICD-10-CM

## 2023-08-11 DIAGNOSIS — R54 Age-related physical debility: Secondary | ICD-10-CM | POA: Insufficient documentation

## 2023-08-11 DIAGNOSIS — I4891 Unspecified atrial fibrillation: Secondary | ICD-10-CM | POA: Diagnosis present

## 2023-08-11 DIAGNOSIS — I2510 Atherosclerotic heart disease of native coronary artery without angina pectoris: Secondary | ICD-10-CM | POA: Insufficient documentation

## 2023-08-11 DIAGNOSIS — I5023 Acute on chronic systolic (congestive) heart failure: Principal | ICD-10-CM | POA: Insufficient documentation

## 2023-08-11 DIAGNOSIS — I252 Old myocardial infarction: Secondary | ICD-10-CM | POA: Insufficient documentation

## 2023-08-11 DIAGNOSIS — I38 Endocarditis, valve unspecified: Secondary | ICD-10-CM

## 2023-08-11 DIAGNOSIS — I517 Cardiomegaly: Secondary | ICD-10-CM

## 2023-08-11 DIAGNOSIS — I779 Disorder of arteries and arterioles, unspecified: Secondary | ICD-10-CM

## 2023-08-11 DIAGNOSIS — N179 Acute kidney failure, unspecified: Secondary | ICD-10-CM | POA: Insufficient documentation

## 2023-08-11 DIAGNOSIS — I5022 Chronic systolic (congestive) heart failure: Secondary | ICD-10-CM

## 2023-08-11 DIAGNOSIS — I509 Heart failure, unspecified: Secondary | ICD-10-CM

## 2023-08-11 DIAGNOSIS — Z7901 Long term (current) use of anticoagulants: Secondary | ICD-10-CM | POA: Insufficient documentation

## 2023-08-11 DIAGNOSIS — I5189 Other ill-defined heart diseases: Secondary | ICD-10-CM

## 2023-08-11 DIAGNOSIS — R0600 Dyspnea, unspecified: Secondary | ICD-10-CM

## 2023-08-11 LAB — CBC WITH DIFF
BASOPHIL #: 0 10*3/uL (ref 0.00–0.20)
BASOPHIL %: 1 % (ref 0–2)
EOSINOPHIL #: 0.2 10*3/uL (ref 0.00–4.00)
EOSINOPHIL %: 3 % (ref 0–4)
HCT: 33.7 % — ABNORMAL LOW (ref 36.0–47.0)
HGB: 10.7 g/dL — ABNORMAL LOW (ref 12.0–15.0)
LYMPHOCYTE #: 1.6 10*3/uL (ref 1.00–3.80)
LYMPHOCYTE %: 22 % (ref 20–35)
MCH: 30.3 pg (ref 27.0–32.0)
MCHC: 31.9 g/dL — ABNORMAL LOW (ref 32.0–36.0)
MCV: 95.1 fL (ref 80.0–100.0)
MONOCYTE #: 0.8 10*3/uL (ref 0.00–1.40)
MONOCYTE %: 11 % (ref 3–13)
MPV: 7.2 fL (ref 6.6–9.3)
NEUTROPHIL #: 4.3 10*3/uL (ref 2.40–7.60)
NEUTROPHIL %: 62 % (ref 50–70)
PLATELETS: 217 10*3/uL (ref 140–450)
RBC: 3.54 10*6/uL — ABNORMAL LOW (ref 4.20–5.40)
RDW: 17.3 % — ABNORMAL HIGH (ref 12.0–15.0)
WBC: 6.9 10*3/uL (ref 4.8–10.8)

## 2023-08-11 LAB — BASIC METABOLIC PANEL
ANION GAP: 6 mmol/L (ref 4–13)
BUN/CREA RATIO: 11 (ref 6–22)
BUN: 18 mg/dL (ref 8–25)
CALCIUM: 9.7 mg/dL (ref 8.6–10.3)
CHLORIDE: 111 mmol/L (ref 96–111)
CO2 TOTAL: 28 mmol/L (ref 23–31)
CREATININE: 1.62 mg/dL — ABNORMAL HIGH (ref 0.60–1.05)
ESTIMATED GFR - FEMALE: 30 mL/min/BSA — ABNORMAL LOW (ref >=60)
GLUCOSE: 98 mg/dL (ref 65–125)
POTASSIUM: 3.6 mmol/L (ref 3.5–5.1)
SODIUM: 145 mmol/L (ref 136–145)

## 2023-08-11 LAB — IRON TRANSFERRIN AND TIBC
IRON (TRANSFERRIN) SATURATION: 24 % (ref 20–50)
IRON: 67 ug/dL (ref 45–170)
TOTAL IRON BINDING CAPACITY: 274 ug/dL (ref 224–476)
TRANSFERRIN: 196 mg/dL (ref 160–340)

## 2023-08-11 LAB — TROPONIN-I
TROPONIN-I HS: 44.3 ng/L (ref <=14.0)
TROPONIN-I HS: 44.3 ng/L (ref <=14.0)

## 2023-08-11 LAB — FERRITIN: FERRITIN: 83 ng/mL (ref 5–200)

## 2023-08-11 LAB — B-TYPE NATRIURETIC PEPTIDE (BNP),PLASMA: BNP: 1545 pg/mL — ABNORMAL HIGH (ref <=99)

## 2023-08-11 MED ORDER — ATORVASTATIN 40 MG TABLET
40.0000 mg | ORAL_TABLET | Freq: Every evening | ORAL | Status: DC
Start: 2023-08-11 — End: 2023-08-12
  Administered 2023-08-11: 40 mg via ORAL
  Filled 2023-08-11: qty 1

## 2023-08-11 MED ORDER — APIXABAN 5 MG TABLET
5.0000 mg | ORAL_TABLET | Freq: Two times a day (BID) | ORAL | Status: DC
Start: 2023-08-11 — End: 2023-08-11

## 2023-08-11 MED ORDER — BRIMONIDINE 0.2 %-TIMOLOL 0.5 % EYE DROPS
1.0000 [drp] | Freq: Two times a day (BID) | OPHTHALMIC | Status: DC
Start: 2023-08-11 — End: 2023-08-11

## 2023-08-11 MED ORDER — NYSTATIN 100,000 UNIT/GRAM TOPICAL POWDER
Freq: Four times a day (QID) | CUTANEOUS | Status: DC | PRN
Start: 2023-08-11 — End: 2023-08-12

## 2023-08-11 MED ORDER — BUMETANIDE 0.25 MG/ML INJECTION SOLUTION
1.0000 mg | INTRAMUSCULAR | Status: AC
Start: 2023-08-11 — End: 2023-08-11
  Administered 2023-08-11: 1 mg via INTRAVENOUS
  Filled 2023-08-11: qty 10

## 2023-08-11 MED ORDER — MULTIVITAMIN WITH FOLIC ACID 400 MCG TABLET
1.0000 | ORAL_TABLET | Freq: Two times a day (BID) | ORAL | Status: DC
Start: 2023-08-11 — End: 2023-08-12
  Administered 2023-08-11 – 2023-08-12 (×2): 1 via ORAL
  Filled 2023-08-11 (×2): qty 1

## 2023-08-11 MED ORDER — APIXABAN 2.5 MG TABLET
2.5000 mg | ORAL_TABLET | Freq: Two times a day (BID) | ORAL | Status: DC
Start: 2023-08-11 — End: 2023-08-12
  Administered 2023-08-11 – 2023-08-12 (×2): 2.5 mg via ORAL
  Filled 2023-08-11 (×2): qty 1

## 2023-08-11 MED ORDER — CYANOCOBALAMIN (VIT B-12) 1,000 MCG TABLET
500.0000 ug | ORAL_TABLET | Freq: Every day | ORAL | Status: DC
Start: 2023-08-11 — End: 2023-08-12
  Administered 2023-08-11 – 2023-08-12 (×2): 500 ug via ORAL
  Filled 2023-08-11 (×2): qty 1

## 2023-08-11 MED ORDER — FERROUS SULFATE 27 MG IRON TABLET
1.0000 | ORAL_TABLET | Freq: Every day | ORAL | Status: DC
Start: 2023-08-11 — End: 2023-08-11

## 2023-08-11 MED ORDER — LEVOTHYROXINE 125 MCG TABLET
125.0000 ug | ORAL_TABLET | Freq: Every day | ORAL | Status: DC
Start: 2023-08-11 — End: 2023-08-12
  Administered 2023-08-11 – 2023-08-12 (×2): 125 ug via ORAL
  Filled 2023-08-11 (×2): qty 1

## 2023-08-11 MED ORDER — ALUMINUM-MAG HYDROXIDE-SIMETHICONE 200 MG-200 MG-20 MG/5 ML ORAL SUSP
20.0000 mL | ORAL | Status: DC | PRN
Start: 2023-08-11 — End: 2023-08-12

## 2023-08-11 MED ORDER — NITROGLYCERIN 0.4 MG SUBLINGUAL TABLET
0.8000 mg | SUBLINGUAL_TABLET | SUBLINGUAL | Status: AC
Start: 2023-08-11 — End: 2023-08-11
  Administered 2023-08-11: 0.8 mg via SUBLINGUAL
  Filled 2023-08-11: qty 2

## 2023-08-11 MED ORDER — ASPIRIN 81 MG TABLET,DELAYED RELEASE
81.0000 mg | DELAYED_RELEASE_TABLET | Freq: Every day | ORAL | Status: DC
Start: 2023-08-11 — End: 2023-08-12
  Administered 2023-08-11 – 2023-08-12 (×2): 81 mg via ORAL
  Filled 2023-08-11 (×2): qty 1

## 2023-08-11 MED ORDER — FUROSEMIDE 10 MG/ML INJECTION SOLUTION
40.0000 mg | INTRAMUSCULAR | Status: DC
Start: 2023-08-11 — End: 2023-08-11
  Administered 2023-08-11: 0 mg via INTRAVENOUS
  Filled 2023-08-11: qty 4

## 2023-08-11 MED ORDER — ISOSORBIDE MONONITRATE ER 60 MG TABLET,EXTENDED RELEASE 24 HR
60.0000 mg | ORAL_TABLET | Freq: Every day | ORAL | Status: DC
Start: 2023-08-11 — End: 2023-08-12
  Administered 2023-08-11 – 2023-08-12 (×2): 60 mg via ORAL
  Filled 2023-08-11 (×2): qty 1

## 2023-08-11 MED ORDER — BRIMONIDINE 0.2 % EYE DROPS
1.0000 [drp] | Freq: Two times a day (BID) | OPHTHALMIC | Status: DC
Start: 2023-08-11 — End: 2023-08-12
  Administered 2023-08-11 – 2023-08-12 (×2): 1 [drp] via OPHTHALMIC
  Filled 2023-08-11: qty 5

## 2023-08-11 MED ORDER — LATANOPROST 0.005 % EYE DROPS
1.0000 [drp] | Freq: Every evening | OPHTHALMIC | Status: DC
Start: 2023-08-11 — End: 2023-08-12
  Administered 2023-08-11: 1 [drp] via OPHTHALMIC
  Filled 2023-08-11: qty 2.5

## 2023-08-11 MED ORDER — TIMOLOL MALEATE 0.5 % EYE DROPS
1.0000 [drp] | Freq: Two times a day (BID) | OPHTHALMIC | Status: DC
Start: 2023-08-11 — End: 2023-08-12
  Administered 2023-08-11 – 2023-08-12 (×2): 0 [drp] via OPHTHALMIC

## 2023-08-11 MED ORDER — ONDANSETRON HCL (PF) 4 MG/2 ML INJECTION SOLUTION
4.0000 mg | Freq: Four times a day (QID) | INTRAMUSCULAR | Status: DC | PRN
Start: 2023-08-11 — End: 2023-08-12

## 2023-08-11 MED ORDER — CARVEDILOL 3.125 MG TABLET
3.1250 mg | ORAL_TABLET | Freq: Two times a day (BID) | ORAL | Status: DC
Start: 2023-08-11 — End: 2023-08-12
  Administered 2023-08-11 – 2023-08-12 (×2): 3.125 mg via ORAL
  Filled 2023-08-11 (×2): qty 1

## 2023-08-11 MED ORDER — MAGNESIUM HYDROXIDE 400 MG/5 ML ORAL SUSPENSION
15.0000 mL | Freq: Every day | ORAL | Status: DC | PRN
Start: 2023-08-11 — End: 2023-08-12

## 2023-08-11 MED ORDER — NITROGLYCERIN 0.4 MG SUBLINGUAL TABLET
0.4000 mg | SUBLINGUAL_TABLET | SUBLINGUAL | Status: AC
Start: 2023-08-11 — End: 2023-08-11
  Administered 2023-08-11: 0.4 mg via SUBLINGUAL
  Filled 2023-08-11: qty 1

## 2023-08-11 MED ORDER — ACETAMINOPHEN 325 MG TABLET
650.0000 mg | ORAL_TABLET | ORAL | Status: DC | PRN
Start: 2023-08-11 — End: 2023-08-12

## 2023-08-11 MED ORDER — FERROUS SULFATE 325 MG (65 MG IRON) TABLET
325.0000 mg | ORAL_TABLET | ORAL | Status: DC
Start: 2023-08-12 — End: 2023-08-12
  Administered 2023-08-12: 325 mg via ORAL
  Filled 2023-08-11: qty 1

## 2023-08-11 MED ORDER — POTASSIUM CHLORIDE ER 20 MEQ TABLET,EXTENDED RELEASE(PART/CRYST)
40.0000 meq | ORAL_TABLET | Freq: Two times a day (BID) | ORAL | Status: DC
Start: 2023-08-11 — End: 2023-08-12
  Administered 2023-08-11: 40 meq via ORAL
  Filled 2023-08-11: qty 2

## 2023-08-11 MED ORDER — LOSARTAN 50 MG TABLET
50.0000 mg | ORAL_TABLET | Freq: Two times a day (BID) | ORAL | Status: DC
Start: 2023-08-11 — End: 2023-08-12
  Administered 2023-08-11: 50 mg via ORAL
  Filled 2023-08-11: qty 1

## 2023-08-11 MED ORDER — BUMETANIDE 0.25 MG/ML INJECTION SOLUTION
0.5000 mg | Freq: Two times a day (BID) | INTRAMUSCULAR | Status: DC
Start: 2023-08-11 — End: 2023-08-12
  Administered 2023-08-11: 0.5 mg via INTRAVENOUS
  Filled 2023-08-11: qty 10

## 2023-08-11 NOTE — ED Nurses Note (Signed)
Patient urinated and back to bed with minimal assist. VS and cardiac monitors in place. Patient denies any c/o pain.

## 2023-08-11 NOTE — ED Nurses Note (Addendum)
Patient resting, blood pressure improving.

## 2023-08-11 NOTE — ED Nurses Note (Signed)
Lab requests lab orders be placed back in system since the stickers were already printed and they cannot use the ones already printed. Duplicate orders due to that.

## 2023-08-11 NOTE — H&P (Signed)
[facility]   Hospitalist  Admission H and P     Date of Service:  08/11/2023  [name], [name], 89 y.o. female  Date of Admission:  08/11/2023  Date of Birth:  Mar 21, 1929  PCP: Marcelino Freestone, FNP          Information Obtained from: patient, daughter, and history reviewed via medical record  Chief Complaint:  sent to ER by PCP for elevated BNP    HPI: [name] [name] is a 89 y.o., White female who presents with SOB and generalized weakness. She was sent by her PCP for elevated BNP on outpatient labs. She has been progressively weak and SOB over several months with dyspnea on mild exertion. She has PMH of heart murmur, HTN, hypothyroid, and MI. BNP in ER 1545, Troponin 44.3, chest xray indicating cardiomegaly and pulmonary edema. She will be admitted under observation for CHF exacerbation and receive IV diuretics and get echocardiogram. Patients last echo was 09/2021 with EF 45% and mild aortic stenosis. Today's exam indicates loud murmur in aortic as well as mitral valves. Daughter states patient sees [facility] for cardiology in [place], [place].     PAST MEDICAL:    Past Medical History:   Diagnosis Date    Arthropathy     Cancer (CMS HCC)     breast    Heart murmur     HTN (hypertension)     MI (myocardial infarction) (CMS HCC)     Thyroid disease         Past Surgical History:   Procedure Laterality Date    HX CORONARY STENT PLACEMENT      HX LYMPH NODE DISSECTION Right             Medications Prior to Admission       Prescriptions    apixaban (ELIQUIS) 5 mg Oral Tablet    Take 1 Tablet (5 mg total) by mouth Twice daily    aspirin (ECOTRIN) 81 mg Oral Tablet, Delayed Release (E.C.)    Take 1 Tablet (81 mg total) by mouth Once a day    carvediloL (COREG) 3.125 mg Oral Tablet    Take 1 Tablet (3.125 mg total) by mouth Twice daily with food    Patient taking differently:  Take 4 Tablets (12.5 mg total) by mouth Twice daily with food    COMBIGAN 0.2-0.5 % Ophthalmic Drops    INSTILL 1 DROP  IN BOTH EYES TWICE DAILY    cyanocobalamin (VITAMIN B-12) 500 mcg Oral Tablet    Take 1 Tablet (500 mcg total) by mouth Once a day    Ferrous Sulfate (HIGH POTENCY IRON) 27 mg iron Oral Tablet    Take by mouth    HYDRAZINE SULFATE, BULK, N/A    1 Tablet Twice daily    ISOSORBIDE MONONITRATE ORAL    Take 30 mg by mouth Twice daily    latanoprost (XALATAN) 0.005 % Ophthalmic Drops    Instill 1 Drop into both eyes Every evening    levothyroxine (SYNTHROID) 125 mcg Oral Tablet    Take 1 Tablet (125 mcg total) by mouth Once a day    losartan (COZAAR) 25 mg Oral Tablet    Take 2 Tablets (50 mg total) by mouth Twice daily    Multivitamins with Fluoride (MULTI-VITAMIN PO)    Take by mouth    potassium chloride ([name]-DUR) 20 mEq Oral Tab Sust.Rel. Particle/Crystal    Take 2 Tablets (40 mEq total) by mouth Twice  daily    simvastatin (ZOCOR) 40 mg Oral Tablet    Take 1 Tablet (40 mg total) by mouth Once a day    torsemide (DEMADEX) 20 mg Oral Tablet    Take 2 Tablets (40 mg total) by mouth Once a day for 30 days    vit A/vit C/vit E/zinc/copper (PRESERVISION AREDS ORAL)    Take 1 Tablet by mouth Twice daily          No Known Allergies      Family History       Social History  Social History     Tobacco Use    Smoking status: Never    Smokeless tobacco: Never   Substance Use Topics    Alcohol use: Never           ROS: Other than ROS in the HPI, all other systems were negative.  All other systems reviewed and negative other than history of present illness.    Examination:    Heart Rate: 63 BP (Non-Invasive): (!) 185/83   Respiratory Rate: 18 SpO2: 92 %       Constitutional:  No distress and alert and oriented.  Eyes:  Conjunctiva clear, Pupils equal and round.   Neck:  Supple, symmetrical, trachea midline.  Respiratory:  Clear to auscultation bilaterally, no wheezes.  Cardiovascular:  Regular rate and rhythm, no murmurs.  Gastrointestinal:  Soft, non-tender, Bowel sounds normal, non-distended.  Musculoskeletal:  Head atraumatic and  normocephalic.  Integumentary:  Skin warm and dry, No rashes or lesions.  Neurologic:  CN II - XII grossly intact, No tremor.     Labs:    CBC (Last 24 Hours):    Recent Results last 24 hours     08/11/23  1159   WBC 6.9   HGB 10.7*   HCT 33.7*   MCV 95.1   PLTCNT 217         Basic Metabolic Profile    Lab Results   Component Value Date/Time    SODIUM 145 08/11/2023 11:59 AM    POTASSIUM 3.6 08/11/2023 11:59 AM    CHLORIDE 111 08/11/2023 11:59 AM    CO2 28 08/11/2023 11:59 AM    ANIONGAP 6 08/11/2023 11:59 AM    Lab Results   Component Value Date/Time    BUN 18 08/11/2023 11:59 AM    CREATININE 1.62 (H) 08/11/2023 11:59 AM            Hepatic Function    Lab Results   Component Value Date/Time    ALBUMIN 2.9 (L) 03/16/2023 02:06 PM    TOTALPROTEIN 6.5 03/16/2023 02:06 PM    ALKPHOS 59 03/16/2023 02:06 PM    Lab Results   Component Value Date/Time    AST 16 03/16/2023 02:06 PM    ALT 9 03/16/2023 02:06 PM                    Imaging Studies:   XR AP MOBILE CHEST   Final Result   Impression: The heart is enlarged. Lower lung predominant interstitial and airspace opacities, slightly increased as compared to June 22, 2023, favoring pulmonary edema over pneumonia. Small bibasilar pleural effusions. No pneumothorax.                  Radiologist location ID: [id number]               DNR Status: full code    Assessment/Plan:   Active Hospital Problems    Diagnosis  Primary Problem: Acute on chronic HFrEF (heart failure with reduced ejection fraction) (CMS HCC)    Nonrheumatic aortic valve stenosis    Atrial fibrillation (CMS HCC)    Essential hypertension     CHF exacerbation: holding oral diuretic, will start IV Bumex BID. Echo in am.  Chronic systolic HF: clinically heart failure and valvular disease worsening from last ECHO. Will repeat echo and continue home meds at this time  A fib: placing on tele pack, continuing home meds     DVT/PE Prophylaxis: Apixaban  Disposition Planning: Home discharge     Chrystal Darci Needle, FNP-C     Spent approximately 60 minutes completing direct and indirect care reviewing documentation, assessing and evaluating patient, collaborating with care team, and completing medical records.     I personally saw and examined the patient.  See NP Chrystal Skinner's note for additional details.  My findings are as follows:     [name] is a 89 y/o female with PMH significant for paroxysmal atrial fibrillation, CAD (MPS in 2018 was high risk with inferior and inferolateral scar with peri-infarct ischemia with EF of 30%; LHC in 2018 with 90% RCA stenosis and 60% distal RCA stenosis with no significant left main disease s/p DES to RCA), moderate aortic stenosis, Chronic HFrEF, h/o breast cancer, Hypothyroidism, HLD, HTN, OSA, moderate right carotid artery disease (on carotid ultrasound 2019), h/o IDA that presented to the ER for elevated proBNP. The patient is accompanied by her daughter who also provides collateral information. She follows with a Cardiologist at [facility] routinely (last OV July, next OV January). Her daughter notes intermittent SOB with activity to the point that she has to rest after routine things over the last several weeks. She monitors her weight very closely with average dry weight being 180-185 lbs. She has not noticed any fluctuations in her weight which is why she felt it wasn't heart related. Her lower extremities have remained about baseline for her. She denies any associated chest pains but does note significant fatigue that she relates to "being 89 y/o". She feels that she has no energy to do anything. She does take an iron pill for h/o IDA. She and her daughter were unaware of a history of aortic stenosis. She otherwise states that she feels fine today.     In the ER, she was noted to be afebrile and hypertensive (SBP 160-180's) but otherwise hemodynamically stable. Her labs were remarkable for normocytic anemia (10.7), elevated proBNP (1,545), elevated troponin  (44.3), AKI (Cr 1.62, GFR 30). CXR with pulmonary edema. She was admitted for further management.     Physical Examination:   General: stable appearing female in NAD; answered questions appropriately; A&Ox3  Cardiac: 4/6 systolic murmur appreciated; no m/r/g  Pulm: CTAB; no wheezing/rales/rhonchi; breathing comfortably on room air   GI: soft, NT ND  Extremities: 1+ pitting edema bilateral lower extremities; chronic venous stasis dermatitis changes noted with some weeping involving the LLE.     Labs and imaging personally reviewed.     Assessment/Plan:     Active Hospital Problems    Diagnosis    Primary Problem: Acute on chronic HFrEF (heart failure with reduced ejection fraction) (CMS HCC)    Nonrheumatic aortic valve stenosis    Atrial fibrillation (CMS HCC)    Essential hypertension     - admit to med/surg   - update TTE; fatigue and worsening SOB may be related to progression of her known moderate aortic stenosis. Given her age,  unclear if she would be a candidate for TAVR. We will try to get records from her Cardiologist [facility] for updated cardiac status and treatment course.   - agree with IV Bumex for now, can transition to PO as clinically appropriate   - recommend checking iron levels, if deficient she would benefit from IV iron transfusion for repletion and symptomatic treatment of acute HFrEF   - daily BMP, Mag, CBC - replete lytes as appropriate   - telemetry monitoring   - continue with Eliquis for atrial fibrillation   - pain, nausea medications available PRN   - PTA medications per [facility]  - PT; social work following appreciate recommendations regarding discharge planning needs     I spent a total of 45 minutes in direct/indirect care of this patient including initial evaluation, review of laboratory, radiology, diagnostic studies, review of medical record, order entry and coordination of care.      Janann August, DO  08/11/2023, 15:53  Internal Medicine, Hospitalist

## 2023-08-11 NOTE — ED Nurses Note (Signed)
Alyssa, RN, Med Surg called and got BP update and states that patient is acceptable to be moved to Med Surg now.

## 2023-08-11 NOTE — Nurses Notes (Signed)
Patient is in bed with bed in lowest position, wheels locked, and side rails up x3. Patient pleasant and cooperative. Daughter remains at bedside. Home medications given to daughter to take home. Call light and fluids are within reach.

## 2023-08-11 NOTE — ED Nurses Note (Signed)
Patient up to [facility] for BM and back into bed without problem.

## 2023-08-11 NOTE — ED Nurses Note (Addendum)
Called report to Med Surg and gave to Alyssa, RN, requested that elevated BP be addressed prior to patient coming to the floor. Physician notified.

## 2023-08-11 NOTE — ED Nurses Note (Signed)
Noted oxygen decreased to 82% when sleeping.  Pt quickly returns to mid 90's when awake and alert.  Dr. Gerarda Fraction in room at this time.  Plan to place on 2L oxymask for sleeping.

## 2023-08-11 NOTE — ED Triage Notes (Signed)
Received report today from Marcelino Freestone that BNP drawn on Thursday was elevated, Hx of AFib

## 2023-08-11 NOTE — ED Nurses Note (Signed)
Patient and daughter states that patient can not take Lasix IV because it made her "deathly sick," weak and dizzy back in August when she had it @ [facility]. Dr. Leighton Parody notified and states he will take with patient and daughter.

## 2023-08-11 NOTE — Care Plan (Signed)
Problem: Adult Inpatient Plan of Care  Goal: Plan of Care Review  Outcome: Ongoing (see interventions/notes)  Goal: Patient-Specific Goal (Individualized)  Outcome: Ongoing (see interventions/notes)  Goal: Absence of Hospital-Acquired Illness or Injury  Outcome: Ongoing (see interventions/notes)  Intervention: Prevent Skin Injury  Recent Flowsheet Documentation  Taken 08/11/2023 1800 by Mickel Fuchs, RN  Body Position: supine, head elevated  Goal: Optimal Comfort and Wellbeing  Outcome: Ongoing (see interventions/notes)  Goal: Rounds/Family Conference  Outcome: Ongoing (see interventions/notes)     Problem: Fall Injury Risk  Goal: Absence of Fall and Fall-Related Injury  Outcome: Ongoing (see interventions/notes)     Problem: Gas Exchange Impaired  Goal: Optimal Gas Exchange  Outcome: Ongoing (see interventions/notes)     Problem: Skin Injury Risk Increased  Goal: Skin Health and Integrity  Outcome: Ongoing (see interventions/notes)

## 2023-08-11 NOTE — ED Provider Notes (Signed)
[facility] - Emergency Department  ED Primary Provider Note  History of Present Illness   Chief Complaint   Patient presents with    Abnormal Lab Result     BNP elevated     [name] is a 89 y.o. female who had concerns including Abnormal Lab Result.  Arrival: The patient arrived by Car    89 year old female presents for further evaluation of abnormal outpatient laboratory study.  She was accompanied to the visit by her daughter he was states that patient was at her doctor's office for routine annual evaluation.  Part of this included laboratory testing.  She came back with the elevated BNP.  On further questioning, patient has had some symptoms of dyspnea with exertion.  She was had no recent chest pain or dizziness.  Denies significant lower extremity swelling.  Review of record shows she had echocardiogram greater than 1 year ago, this showed an EF of 45%, mild aortic stenosis that time.  She was had 2 prior visits this year in the ER which she was similar to pains, treated with diuretics, and discharged from the ER.        History Reviewed This Encounter: Medical History  Surgical History  Family History  Social History    Physical Exam   ED Triage Vitals   BP (Non-Invasive) 08/11/23 1126 (!) 158/68   Heart Rate 08/11/23 1126 58   Respiratory Rate 08/11/23 1126 17   Temperature 08/11/23 1801 36.7 C (98 F)   SpO2 08/11/23 1126 99 %   Weight 08/11/23 1126 81.6 kg (180 lb)   Height 08/11/23 1126 1.651 m (5\' 5" )     Physical Exam  Vitals and nursing note reviewed.   Constitutional:       General: She is not in acute distress.     Appearance: She is well-developed.   HENT:      Head: Normocephalic and atraumatic.   Eyes:      Conjunctiva/sclera: Conjunctivae normal.   Cardiovascular:      Rate and Rhythm: Normal rate and regular rhythm.      Heart sounds: Murmur heard.      Comments: 4/6 crescendo, decrescendo murmur best heard over the left upper sternal border.  Pulmonary:       Effort: Pulmonary effort is normal. No respiratory distress.      Breath sounds: Rales present.   Abdominal:      Palpations: Abdomen is soft.      Tenderness: There is no abdominal tenderness.   Musculoskeletal:         General: No swelling.      Cervical back: Neck supple.   Skin:     General: Skin is warm and dry.      Capillary Refill: Capillary refill takes less than 2 seconds.   Neurological:      Mental Status: She is alert.   Psychiatric:         Mood and Affect: Mood normal.       Patient Data     Labs Ordered/Reviewed   B-TYPE NATRIURETIC PEPTIDE (BNP),PLASMA - Abnormal; Notable for the following components:       Result Value    BNP 1,545 (*)     All other components within normal limits   BASIC METABOLIC PANEL - Abnormal; Notable for the following components:    CREATININE 1.62 (*)     ESTIMATED GFR - FEMALE 30 (*)     All other components within  normal limits   TROPONIN-I - Abnormal; Notable for the following components:    TROPONIN-I HS 44.3 (*)     All other components within normal limits   CBC WITH DIFF - Abnormal; Notable for the following components:    RBC 3.54 (*)     HGB 10.7 (*)     HCT 33.7 (*)     MCHC 31.9 (*)     RDW 17.3 (*)     All other components within normal limits   TROPONIN-I - Abnormal; Notable for the following components:    TROPONIN-I HS 44.3 (*)     All other components within normal limits   IRON TRANSFERRIN AND TIBC - Normal    Narrative:     In hereditary hemochromatosis, serum iron is usually >150 ug/dL and % saturation is >28%. In advanced iron overload, % saturation is often >90%.   FERRITIN - Normal   CBC/DIFF    Narrative:     The following orders were created for panel order CBC/DIFF.  Procedure                               Abnormality         Status                     ---------                               -----------         ------                     CBC WITH UXLK[[id number]]                Abnormal            Final result                 Please view results for  these tests on the individual orders.     XR AP MOBILE CHEST   Final Result by Edi, Radresults In (10/15 1159)   Impression: The heart is enlarged. Lower lung predominant interstitial and airspace opacities, slightly increased as compared to June 22, 2023, favoring pulmonary edema over pneumonia. Small bibasilar pleural effusions. No pneumothorax.                  Radiologist location ID: [id number]           Medical Decision Making        Medical Decision Making  89 year old female with dyspnea on exertion.  In conjunction with outpatient elevated BNP, clinical exam, presentation is consistent with congestive heart failure.  She was a notable murmur on exam, more likely aortic stenosis, maybe contributory.  She was not present with exertional syncope or near-syncope.  EKGs performed.  This shows a nonischemic pattern.  Troponin is slightly elevated.  It was noted that she was had similar levels on most recent ER assessments as well.  Suspect this is secondary to global demand rather than infarct.  She was treated with nitroglycerin as she was noted to be hypotensive during her ER visit, subsequent treatment with Bumex for diuresis.  Admission is requested for further evaluation via echocardiogram and ongoing diuresis.    Amount and/or Complexity of Data Reviewed  Labs: ordered.  Radiology: ordered.  ECG/medicine tests: ordered.    Risk  Prescription drug management.  Decision regarding hospitalization.                Medications Administered in the ED   nitroGLYCERIN (NITROSTAT) sublingual tablet (0.4 mg Sublingual Given 08/11/23 1336)   bumetanide (BUMEX) 0.25 mg/mL injection (1 mg Intravenous Given 08/11/23 1354)     Clinical Impression   Acute on chronic HFrEF (heart failure with reduced ejection fraction) (CMS HCC) (Primary)   Chronic systolic heart failure (CMS HCC)   Age-related physical debility       Disposition: Admitted

## 2023-08-11 NOTE — ED Nurses Note (Signed)
Patient up to [facility] to urinate, also had BM. Back to bed with minimal assist. VS and cardiac monitors in place.

## 2023-08-11 NOTE — Nurses Notes (Signed)
Patient arrived to the unit via wheelchair and placed in room 107. Patient onto bed with staff assistance. Family is at bedside. Patient oriented to room and call light use. Plan of care initiated.

## 2023-08-11 NOTE — ED Nurses Note (Signed)
Patient up to [facility] to urinate, wants to sit on commode for few mins. Daughter @ side.

## 2023-08-12 ENCOUNTER — Other Ambulatory Visit (HOSPITAL_COMMUNITY): Payer: Self-pay | Admitting: NURSE PRACTITIONER, FAMILY

## 2023-08-12 LAB — COMPREHENSIVE METABOLIC PANEL, NON-FASTING
ALBUMIN: 2.5 g/dL — ABNORMAL LOW (ref 3.4–4.8)
ALKALINE PHOSPHATASE: 57 U/L (ref 55–145)
ALT (SGPT): 13 U/L (ref 8–22)
ANION GAP: 6 mmol/L (ref 4–13)
AST (SGOT): 18 U/L (ref 8–45)
BILIRUBIN TOTAL: 1.3 mg/dL (ref 0.3–1.3)
BUN/CREA RATIO: 12 (ref 6–22)
BUN: 16 mg/dL (ref 8–25)
CALCIUM: 9.2 mg/dL (ref 8.6–10.3)
CHLORIDE: 114 mmol/L — ABNORMAL HIGH (ref 96–111)
CO2 TOTAL: 27 mmol/L (ref 23–31)
CREATININE: 1.38 mg/dL — ABNORMAL HIGH (ref 0.60–1.05)
ESTIMATED GFR - FEMALE: 36 mL/min/BSA — ABNORMAL LOW (ref >=60)
GLUCOSE: 91 mg/dL (ref 65–125)
POTASSIUM: 3.8 mmol/L (ref 3.5–5.1)
PROTEIN TOTAL: 5.5 g/dL — ABNORMAL LOW (ref 6.0–8.0)
SODIUM: 147 mmol/L — ABNORMAL HIGH (ref 136–145)

## 2023-08-12 LAB — CBC WITH DIFF
BASOPHIL #: 0 10*3/uL (ref 0.00–0.20)
BASOPHIL %: 1 % (ref 0–2)
EOSINOPHIL #: 0.2 10*3/uL (ref 0.00–4.00)
EOSINOPHIL %: 3 % (ref 0–4)
HCT: 33.3 % — ABNORMAL LOW (ref 36.0–47.0)
HGB: 10.8 g/dL — ABNORMAL LOW (ref 12.0–15.0)
LYMPHOCYTE #: 1.5 10*3/uL (ref 1.00–3.80)
LYMPHOCYTE %: 18 % — ABNORMAL LOW (ref 20–35)
MCH: 30.8 pg (ref 27.0–32.0)
MCHC: 32.5 g/dL (ref 32.0–36.0)
MCV: 94.8 fL (ref 80.0–100.0)
MONOCYTE #: 1 10*3/uL (ref 0.00–1.40)
MONOCYTE %: 12 % (ref 3–13)
MPV: 7 fL (ref 6.6–9.3)
NEUTROPHIL #: 5.3 10*3/uL (ref 2.40–7.60)
NEUTROPHIL %: 66 % (ref 50–70)
PLATELETS: 231 10*3/uL (ref 140–450)
RBC: 3.51 10*6/uL — ABNORMAL LOW (ref 4.20–5.40)
RDW: 17.6 % — ABNORMAL HIGH (ref 12.0–15.0)
WBC: 8 10*3/uL (ref 4.8–10.8)

## 2023-08-12 LAB — TROPONIN-I
TROPONIN-I HS: 45.3 ng/L (ref <=14.0)
TROPONIN-I HS: 41.7 ng/L (ref <=14.0)

## 2023-08-12 LAB — MAGNESIUM: MAGNESIUM: 2 mg/dL (ref 1.8–2.6)

## 2023-08-12 MED ORDER — LOSARTAN 50 MG TABLET
100.0000 mg | ORAL_TABLET | Freq: Every day | ORAL | Status: DC
Start: 2023-08-13 — End: 2023-08-12

## 2023-08-12 MED ORDER — EMPAGLIFLOZIN 10 MG TABLET
10.0000 mg | ORAL_TABLET | Freq: Every day | ORAL | 0 refills | Status: AC
Start: 2023-08-12 — End: 2023-09-11

## 2023-08-12 MED ORDER — BUMETANIDE 0.25 MG/ML INJECTION SOLUTION
0.5000 mg | Freq: Two times a day (BID) | INTRAMUSCULAR | Status: DC
Start: 2023-08-12 — End: 2023-08-12
  Administered 2023-08-12: 0.5 mg via INTRAVENOUS
  Filled 2023-08-12: qty 10

## 2023-08-12 MED ORDER — CARVEDILOL 3.125 MG TABLET
3.1250 mg | ORAL_TABLET | Freq: Two times a day (BID) | ORAL | Status: AC
Start: 2023-08-12 — End: ?

## 2023-08-12 MED ORDER — ISOSORBIDE MONONITRATE ER 60 MG TABLET,EXTENDED RELEASE 24 HR
60.0000 mg | ORAL_TABLET | Freq: Every day | ORAL | 0 refills | Status: DC
Start: 2023-08-13 — End: 2023-08-14

## 2023-08-12 MED ORDER — SPIRONOLACTONE 25 MG TABLET
25.0000 mg | ORAL_TABLET | Freq: Every morning | ORAL | Status: DC
Start: 2023-08-12 — End: 2023-08-12
  Administered 2023-08-12: 25 mg via ORAL
  Filled 2023-08-12: qty 1

## 2023-08-12 MED ORDER — DAPAGLIFLOZIN PROPANEDIOL 5 MG TABLET
10.0000 mg | ORAL_TABLET | Freq: Every day | ORAL | Status: DC
Start: 2023-08-12 — End: 2023-08-12
  Administered 2023-08-12: 10 mg via ORAL
  Filled 2023-08-12: qty 2

## 2023-08-12 MED ORDER — FERROUS SULFATE 325 MG (65 MG IRON) TABLET
325.0000 mg | ORAL_TABLET | ORAL | 0 refills | Status: AC
Start: 2023-08-14 — End: 2023-09-13

## 2023-08-12 MED ORDER — SPIRONOLACTONE 25 MG TABLET
25.0000 mg | ORAL_TABLET | Freq: Every morning | ORAL | 0 refills | Status: AC
Start: 2023-08-13 — End: 2023-10-21

## 2023-08-12 MED ORDER — LOSARTAN 100 MG TABLET
100.0000 mg | ORAL_TABLET | Freq: Every day | ORAL | 0 refills | Status: AC
Start: 2023-08-13 — End: 2023-10-21

## 2023-08-12 MED ORDER — TORSEMIDE 20 MG TABLET
40.0000 mg | ORAL_TABLET | Freq: Every day | ORAL | Status: AC
Start: 2023-08-12 — End: 2023-09-11

## 2023-08-12 MED ORDER — BUMETANIDE 0.25 MG/ML INJECTION SOLUTION
1.0000 mg | Freq: Two times a day (BID) | INTRAMUSCULAR | Status: DC
Start: 2023-08-12 — End: 2023-08-12

## 2023-08-12 MED ORDER — APIXABAN 2.5 MG TABLET
2.5000 mg | ORAL_TABLET | Freq: Two times a day (BID) | ORAL | 0 refills | Status: AC
Start: 2023-08-12 — End: 2023-09-11

## 2023-08-12 NOTE — Nurses Notes (Signed)
Pt AVS reviewed with pt's daughter. No questions or concerns at this time. IV removed, catheter tip intact. Tele monitoring removed. Pt dressed in street clothes. Pt walked to wheelchair. Pt transported from floor to POV via wheelchair. Pt transferred from chair to POV without incident.

## 2023-08-12 NOTE — Care Management Notes (Signed)
[facility]  Care Management Initial Evaluation    Patient Name: [name]  Date of Birth: 1929/08/14  Sex: female  Date/Time of Admission: 08/11/2023 11:23 AM  Room/Bed: 107/A  Payor: RAILROAD MEDICARE / Plan: RAILROAD MEDICARE / Product Type: Medicare /   Primary Care Providers:  Marcelino Freestone, FNP, FNP (General)    Pharmacy Info:   Preferred Pharmacy       [facility] [phone] - [place], [place] - [address] AT [facility] [address] [place] [address]    [phone] [place] [address]    Phone: [phone] Fax: [phone]    Hours: Not open 24 hours          Emergency Contact Info:   Extended Emergency Contact Information  Primary Emergency Contact: HAMMONS,CAROLYN  Address: [address]IVEN   [place] [employer] Phone: [phone]  Relation: Daughter    History:   [name] Koskela is a 89 y.o., female, admitted for observation due to CHF       Height/Weight: 165.1 cm (5\' 5" ) / 81.2 kg (179 lb 0.2 oz)     LOS: 0 days   Admitting Diagnosis: CHF exacerbation (CMS HCC) [I50.9]  Acute on chronic HFrEF (heart failure with reduced ejection fraction) (CMS HCC) [I50.23]    Assessment:     SW met with pt and daughter for the initial assessment process.  Pt appeared alert and oriented.  Pt and daughter do not feel HH services are needed at this time.  Per daughter, the PT and OT services cause too much pain for the pt and is not effective.   Pt has a wheelchair ordered and will be picking it up.   Requested copy of pt's MPOA for the chart.        08/12/23 1021   Assessment Details   Assessment Type Admission   Readmission   Is this a readmission? No   Insurance Information/Type   Insurance type Medicare   Employment/Financial   Patient has Prescription Coverage?  Yes        Name of Insurance Coverage for Medications [employer] and [facility] Medicare   Financial Concerns none   Living Environment   Select an age group to open "lives with" row.  Adult   Lives With  child(ren), adult   Living Arrangements house   Able to Return to Prior Arrangements yes   Living Arrangement Comments daughter does not feel services are needed at this time - [facility] were discontinued by the pt and daughter.   Home Safety   Home Assessment: No Problems Identified   Home Accessibility no concerns;bed and bath on same level;stairs to enter home;grab bars present (bathtub)   Home Safety Comments pt has 1 small step to get into the home   Care Management Plan   Discharge Planning Status initial meeting   Projected Discharge Date 08/13/23   Discharge plan discussed with: Patient;Lay Caregiver   Patient aware of possible cost for ambulance transport?  Yes   Discharge Needs Assessment   Equipment Currently Used at Home walker, rolling;shower chair;nebulizer;glucometer;grab bar;other (see comments)  (monitors in the home)   Equipment Needed After Discharge bath bench;wheelchair  (wheelchair has been ordered)   Discharge Facility/Level of Care Needs Home (Patient/Family Member/other)(code 1)   Transportation Available family or friend will provide;car   Referral Information   Admission Type observation   Address Verified verified-no changes   Arrived From home or self-care   Observation  Form   Observation Form Given MOON form given   MOON form explained/reviewed with:  Patient;verbalized understanding   MOON form given to [name] Wildermuth   MOON form date of delivery 08/12/23   MOON form time of delivery 0745   ADVANCE DIRECTIVES   Does the Patient have an Advance Directive? Yes, Patient Does Have Advance Directive for Healthcare Treatment   Copy of Advance Directives in Chart? No, Copy Requested From Family   Name of MPOA or Healthcare Surrogate Dierdre Forth - daughter   Phone Number of MPOA or Healthcare Surrogate [phone]   LAY CAREGIVER    Appointed Lay Caregiver? Yes   Lay Caregiver Name Dierdre Forth - daughter / MPOA   Lay Caregiver Relationship to patient child   Lay Caregiver Contact Number  [phone]   Mutuality/Individual Preferences    Anxieties, Fears or Concerns none verbalized   Home Main Entrance   Number of Stairs, Main Entrance one         Discharge Plan:  Home (Patient/Family Member/other) (code 1)      The patient will continue to be evaluated for developing discharge needs.     Case Manager: Vivia Budge, SOCIAL WORKER

## 2023-08-12 NOTE — Care Plan (Signed)
[facility]  Physical Therapy     Patient Name: [name]  Date of Birth: 1928/11/15  Room/Bed: 107/A      Subjective:   Patient willing to participate with PT.  Pleasant and cooperative.    Objective :  See flow sheet.       08/12/23 1300   Rehab Session   Document Type evaluation   PT Visit Date 08/12/23   Total PT Minutes: 30   Patient Effort excellent   Patient Effort, Rehab Treatment Comment excellent   Pain Assessment   Pre/Posttreatment Pain Comment patient denies pain   RLE Assessment   RLE Assessment WFL- Within Functional Limits   LLE Assessment   LLE Assessment WFL- Within Functional Limits   Bed Mobility Assessment/Treatment   Bed Mobility, Assistive Device Head of Bed Elevated   Supine-Sit Independence set up required;verbal cues required;stand-by assistance   Sit to Supine, Independence set up required;verbal cues required;stand-by assistance   Transfer Assessment/Treatment   Sit-Stand Independence set up required;verbal cues required;contact guard assist   Stand-Sit Independence set up required;verbal cues required;contact guard assist   Sit-Stand-Sit, Assist Device walker, front wheeled   Gait Assessment/Treatment   Total Distance Ambulated 30   Independence  set up required;verbal cues required;contact guard assist   Assistive Device  walker, front wheeled   Distance in Feet 1  x20, 1 x 10   Gait Speed appropriate for baseline   Balance   Sitting Balance: Static normal balance   Sitting, Dynamic (Balance) normal balance   Sit-to-Stand Balance good balance   Standing Balance: Static good balance   Standing Balance: Dynamic good balance   Therapeutic Exercise/Activity   Comment Eval, gait   Physical Therapy Clinical Impression   Assessment 89 year old female who was seen today for PT evaluation.  Patient doing well - ambulated a total of 30 feet- FWW, cga.  Bed mobility and transfers reuired verabal cues and SBA.   Patient/Family Goals Statement return home with daughter   Criteria  for Skilled Therapeutic no problems identified which require skilled intervention   Anticipated Equipment Needs at Discharge (PT) TBD;none anticipated   Anticipated Discharge Disposition home with assist         Assessment:  See assessment in flow sheet above.    Goals : PT eval completed.    Plan:  Anticipate D/C home with daughter when medically cleared.    Robbi Garter, PT

## 2023-08-12 NOTE — Nurses Notes (Signed)
08/12/23 0800   Cognitive   Cognitive/Neuro/Behavioral WDL WDL   Orientation oriented x 4   Aspiration Screening Tool (Not for Day Surgery Patients)   Does the patient have any of the following diagnoses/medical conditions or history of: Acutely ill, frail elderly w/comorbidities   Glasgow Coma Scale   Best Eye Response 4-->(E4) spontaneous   Best Motor Response 6-->(M6) obeys commands   Best Verbal Response 5-->(V5) oriented   Glasgow Coma Scale Score 15   Pupils   Pupil PERRLA yes   Behavioral   Additional Documentation Behavior WDL (Group)   Behavior WDL   Behavior WDL WDL   HEENT   HEENT WDL WDL   Mouth/Teeth WDL   Mouth/Teeth WDL ex;teeth   Teeth Symptoms tooth/teeth missing   Cardiac   Cardiovascular WDL WDL   Cardiac Rhythm apical pulse irregular;radial pulse irregular   Additional Documentation Telemetry (Row)   Telemetry Monitor On Yes   Telemetry Audible Yes   Telemetry Alarms Set Yes   Telemetry Box Number 7   Telemetry batteries and leads changed Yes   ECG   Lead Monitored Lead II   Rhythm atrial rhythm   Atrial Rhythm atrial fibrillation   Peripheral Neurovascular   Peripheral Neurovascular WDL WDL   Capillary Refill, General less than/equal to 3 secs   VTE Prevention/Management ambulation promoted   Respiratory   Respiratory WDL WDL   Cough And Deep Breathing done independently per patient   Breath Sounds   L General Breath Sounds Diminished   R General Breath Sounds Diminished   Throughout All Lung Fields All Fields   Oxygen Therapy   O2 Delivery Source  RA   Gastrointestinal   Gastrointestinal WDL WDL   Date of Last Bowel Movement 08/11/23   Stool Description Per Patient Statement   Genitourinary   Genitourinary WDL WDL   Musculoskeletal   Musculoskeletal WDL ex;mobility   Braden Risk Assessment   Sensory Perception 3-->slightly limited   Moisture 3-->occasionally moist   Activity 3-->walks occasionally   Mobility 3-->slightly limited   Nutrition 3-->adequate   Friction and Shear 3-->no apparent  problem   Braden Score 18   Braden Score Level 15-18 (Low Risk)   Skin   Skin WDL WDL   Skin Color/Characteristics without discoloration   Skin Temperature warm   Skin Moisture dry   Skin Elasticity quick return to original state   Skin Integrity excoriation   Location of altered skin integrity: bilat breasts   Interventions performed: monitor   Pain   Pain Assessment Assessment   Rationale for Non-Scoring Patient resting - eyes closed and respirations WDL   Pain Scale Used [facility]   Overall Pain Rating (Numeric/Faces) 0   [facility] Fall Risk Tool   Fall Risk Assessment Shift Assessment   Fall Risk- Implement fall risk interventions per protocol 0   Age 89   Fall History 0   Elimination, Bowel and Urine 0   Medications (Fall risk drugs: PCA/Opiates, Anti-Convulsants, Anti-Hypertensives, Diuretics, Hypnotics, Laxatives, Sedatives and Psychotropics) 3   Patient Care Equipment 1   Mobility (Yes on any of these rows will result in High Fall Risk)   Requires assistance or supervision for mobility, transfer, or ambulation 2   Unsteady Gait 0   Visual or auditory impairment affecting mobility 0   Cognition (Yes on any of these rows will result in High Fall Risk)   Altered awareness of immediate physical environment 0   Impulsive 0   Lack of understanding of  one's physical and cognitive limitations 0   [facility] Score and Interventions   [employer] Score Total 9   [facility] Identified Fall Risk Auto High Risk (>13 points or Yes in any Mobility or Cognition Row)   Fall Risk Interventions (All That Apply) (L,M,H) Universal Falls Precautions   Safety   Safety Factors upper side rails raised x 2, lower siderail raised x 1;bed in low position;wheels locked;call light in reach;ID band on;fall risk slippers on   ID Band verified as correct (1st) Checked   ID Band verified as correct (2nd) Checked   Code status band matches order Yes   Enhanced Safety Measures bed alarm set   Safety Promotion/Fall Prevention activity  supervised;fall prevention program maintained;nonskid shoes/slippers when out of bed;safety round/check completed   Environmental Safety Modification assistive device/personal items within reach;clutter-free environment maintained;lighting adjusted   Medication Review/Management medications reviewed   Additional Documentation All Alarms (Row)   All Alarms alarm(s) activated and audible   Safety Promotion   Safety Measures safety rounds completed;alarms on   Precautions   Precautions Fall   Nutrition   Diet/Nutrition Received cardiac   Coping/Psychosocial Assessment   Observed Emotional State calm;cooperative   Verbalized Emotional State acceptance   Trust Relationship/Rapport care explained;choices provided;questions encouraged;questions answered   Family/Support Persons daughter   Involvement in Care attentive to patient;at bedside;participating in care;supportive of patient   Family/Support System Care caregiver stress acknowledged;self-care encouraged;support provided   Diversional Activities television   Coping/Psychosocial Interventions   Supportive Measures active listening utilized

## 2023-08-12 NOTE — Discharge Summary (Signed)
[facility]  DISCHARGE SUMMARY    PATIENT NAME:  [name]  MRN:  [id number]  DOB:  04-15-1929    ENCOUNTER DATE:  08/12/23  INPATIENT ADMISSION DATE: 08/11/23  DISCHARGE DATE:  08/12/23    ATTENDING PHYSICIAN: [name] Mach, DO  SERVICE: [facility] MED/SURG  PRIMARY CARE PHYSICIAN: [name] Freestone, FNP       Lay Caregiver Name: [name] - daughter / MPOA   Lay Caregiver Contact Number: [phone]   Lay Caregiver Relationship to patient: child    PRIMARY DISCHARGE DIAGNOSIS: Acute on chronic HFrEF (heart failure with reduced ejection fraction) (CMS [facility])  Active Hospital Problems    Diagnosis Date Noted    Principal Problem: Acute on chronic HFrEF (heart failure with reduced ejection fraction) (CMS HCC) [I50.23] 08/11/2023    Nonrheumatic aortic valve stenosis [I35.0] 08/11/2023    Atrial fibrillation (CMS HCC) [I48.91] 02/27/2017    Essential hypertension [I10] 01/16/2017      Resolved Hospital Problems    Diagnosis     CHF exacerbation (CMS HCC) [I50.9]     Chronic systolic heart failure (CMS HCC) [I50.22]      Active Non-Hospital Problems    Diagnosis Date Noted    Chest pain, rule out acute myocardial infarction 02/23/2022    Atherosclerosis of coronary artery 03/31/2017    Glaucoma 01/16/2017    Hyperlipidemia 01/16/2017    Hypothyroidism 01/16/2017    History of breast cancer 01/16/2017             Current Discharge Medication List        START taking these medications.        Details   empagliflozin 10 mg Tablet  Commonly known as: Jardiance   10 mg, Oral, DAILY  Qty: 30 Tablet  Refills: 0     spironolactone 25 mg Tablet  Commonly known as: ALDACTONE  Start taking on: August 13, 2023   25 mg, Oral, EVERY MORNING WITH BREAKFAST  Qty: 30 Tablet  Refills: 0            CONTINUE these medications which have CHANGED during your visit.        Details   apixaban 2.5 mg Tablet  Commonly known as: ELIQUIS  What changed:   medication strength  how much to take   2.5 mg, Oral, 2 TIMES DAILY  Qty: 60  Tablet  Refills: 0     carvediloL 3.125 mg Tablet  Commonly known as: COREG  What changed: how much to take   3.125 mg, Oral, 2 TIMES DAILY WITH FOOD  Refills: 0     ferrous sulfate 325 mg (65 mg iron) Tablet  Commonly known as: FEOSOL  Start taking on: August 14, 2023  What changed:   medication strength  how much to take  when to take this   325 mg, Oral, EVERY OTHER DAY  Qty: 15 Tablet  Refills: 0     isosorbide mononitrate 60 mg Tablet Sustained Release 24 hr  Commonly known as: IMDUR  Start taking on: August 13, 2023  What changed:   medication strength  how much to take  when to take this   60 mg, Oral, DAILY  Qty: 30 Tablet  Refills: 0     losartan 100 mg Tablet  Commonly known as: COZAAR  Start taking on: August 13, 2023  What changed:   medication strength  how much to take  when to take this   100 mg, Oral, DAILY  Qty:  30 Tablet  Refills: 0            CONTINUE these medications - NO CHANGES were made during your visit.        Details   aspirin 81 mg Tablet, Delayed Release (E.C.)  Commonly known as: ECOTRIN   81 mg, Oral, DAILY  Refills: 0     Combigan 0.2-0.5 % Drops  Generic drug: brimonidine-timoloL   INSTILL 1 DROP IN BOTH EYES TWICE DAILY  Refills: 0     cyanocobalamin 500 mcg Tablet  Commonly known as: VITAMIN B-12   500 mcg, Oral, DAILY  Refills: 0     HYDRAZINE SULFATE (BULK) N/A   1 Tablet, Does not apply, 2 TIMES DAILY  Refills: 0     latanoprost 0.005 % Drops  Commonly known as: XALATAN   1 Drop, Both Eyes, EVERY EVENING  Refills: 0     levothyroxine 125 mcg Tablet  Commonly known as: SYNTHROID   125 mcg, Oral, DAILY  Refills: 0     MULTI-VITAMIN PO   Oral  Refills: 0     PRESERVISION AREDS ORAL   1 Tablet, Oral, 2 TIMES DAILY  Refills: 0     simvastatin 40 mg Tablet  Commonly known as: ZOCOR   40 mg, Oral, DAILY  Refills: 0     torsemide 20 mg Tablet  Commonly known as: DEMADEX   40 mg, Oral, DAILY  Refills: 0            STOP taking these medications.      potassium chloride 20 mEq Tab  Sust.Rel. Particle/Crystal  Commonly known as: [name]-DUR            Discharge med list refreshed?  YES     No Known Allergies  HOSPITAL PROCEDURE(S):   Echocardiogram    REASON FOR HOSPITALIZATION AND HOSPITAL COURSE   BRIEF HPI:  This is a 89 y.o., female PMH paroxysmal Afib-Eliquis 5mg  BID, CAD s/p DES placement, moderate aortic stenosis, chronic HFrEF, HTN, hyperlipidemia, hypothyroidism, OSA,carotid stenosis, iron def anemia who presented to ED for increased dyspnea with exertion over past several weeks.  Fatigue.  Didn't feel cardiac related as she has had no significant weight gain, however BNP was elevated.  They were not aware of aortic stenosis.  She was admitted for further eval and treatment of acute heart failure.  BRIEF HOSPITAL NARRATIVE:   ED eval revealed BNP 1545, trop 44.3-trended to 41, AKI GFR 30 improved to 36 at DC.  CXR pulmonary edema.  Repeat echocardiogram reveals moderate aortic stenosis, mild LVH, mildly depressed systolic function 40%.  Diuresis with IV Bumex.  Medication adjustments were made in accordance with renal function-decrease Eliquis.  Coreg decreased secondary bradycardia.  Losartan increased BP control.  Imdur increased as well.  Jardiance and spironolactone added.  Lengthy discussion of medication changes with pt and daughter.  Written and verbal.  Call pharmacy/here or PCP with questions.  Follow up appt PCP Monday repeat lab, cardiology 09/09/23.      TRANSITION/POST DISCHARGE CARE/PENDING TESTS/REFERRALS:     PCP-repeat lab 08/17/23  Cardiology    CONDITION ON DISCHARGE:  A. Ambulation: Ambulation with assistive device  B. Self-care Ability: With partial assistance  C. Cognitive Status Oriented x 3  D. Code status at discharge:       LINES/DRAINS/WOUNDS AT DISCHARGE:   Patient Lines/Drains/Airways Status       Active Line / Dialysis Catheter / Dialysis Graft / Drain / Airway / Wound  Name Placement date Placement time Site Days    Peripheral IV Left Basilic  (medial side  of arm) 08/11/23  1136  -- 1                    DISCHARGE DISPOSITION:  Home discharge  DISCHARGE INSTRUCTIONS:  Post-Discharge Follow Up Appointments       Monday Aug 17, 2023    Follow up with [name] Freestone, FNP    Phone: [phone]    Where: [facility], [address] [facility] [address]      Wednesday Aug 26, 2023    SMR CAROTID DUPLEX UNILATERAL OP with SMR [place] 3 at  1:00 PM    TTE ADULT with SMR [place] 3 at  1:30 PM      Wednesday Sep 09, 2023    Follow up with Gloris Manchester, APRN    Phone: [phone]    Where: [phone], [place] [address]      Imaging Services, [facility]  [facility], [place]  [address]  [place] [address]  [phone]          DECREASE Eliquis 2.5mg  twice daily-new prescription at [facility] out old from medication box    DECREASE Coreg/carvedilol 3.125mg  1 tablet twice daily- you have this at home-change in medicine box- you were taking 12.5mg -4 tablets    DECREASE iron to every other day    INCREASE Cozaar/losartan to 100mg  daily- take out the old in medicine box - new prescription at pharmacy    STOP isosorbide mononitrate 30mg  at home.  New prescription at pharmacy for isosorbide mononitrate (Imdur) 60mg  once daily    STOP potassium supplement    NEW spironolactone 25mg  once every morning-helps with fluid-helps hold on to potassium    NEW Jardiance 10mg  once daily-help with heart function    IF questions with new medications and setting up in your prescription box call us/pharmacy/PCP for assistance- could be potentially dangerous if the doses in medication boxes are not changed.    See PCP on Monday for repeat lab    Follow up cardiology 09/09/23        Samuel Bouche, APRN,NP-C    Copies sent to Care Team         Relationship Specialty Notifications Start End    [name] Freestone, FNP PCP - General EXTERNAL  03/04/16     Phone: [phone] Fax:  [phone]         [address] [place] [address]            Referring providers can utilize [email or web address] to access their referred [facility] Medicine patient's information.                  I personally saw and examined the patient.  See NP Amy Mann's note for additional details.  My findings are as follows:     Mrs. [name] is a 89 y/o female with PMH significant for paroxysmal atrial fibrillation, CAD (MPS in 2018 was high risk with inferior and inferolateral scar with peri-infarct ischemia with EF of 30%; LHC in 2018 with 90% RCA stenosis and 60% distal RCA stenosis with no significant left main disease s/p DES to RCA), moderate aortic stenosis, Chronic HFrEF, h/o breast cancer, Hypothyroidism, HLD, HTN, OSA, moderate right carotid artery disease (on carotid ultrasound 2019), h/o IDA that was admitted for acute on chronic HFrEF. She received IV  Bumex with good response. She feels back to her baseline this morning and she and her daughter are agreeable/comfortable with discharge home. TTE results reviewed with LVEF 40% and no progression of her existing moderate aortic stenosis. She has been optimized on GDMT while admitted, will need further titration and additions with her PCP and/or Cardiologist outpatient. Eliquis has been reduced to 2.5 mg BID given her age + kidney function. She is to follow up with her Cardiologist for further recommendations regarding ongoing cardiac care. She is stable and agreeable for discharge home.     Physical Examination:   General: stable appearing female in NAD; answered questions appropriately; A&Ox3  Cardiac: 4/6 systolic murmur appreciated; no m/r/g  Pulm: CTAB; no wheezing/rales/rhonchi; breathing comfortably on room air   GI: soft, NT ND  Extremities: no peripheral edema noted bilaterally; stasis dermatitis changes noted bilateral LE's worse involving the LLE > RLE.     Labs and imaging personally reviewed.     Assessment/Plan:     Active Hospital  Problems    Diagnosis    Primary Problem: Acute on chronic HFrEF (heart failure with reduced ejection fraction) (CMS HCC)    Nonrheumatic aortic valve stenosis    Atrial fibrillation (CMS HCC)    Essential hypertension     - discharge home with family   - discharge meds + instructions as noted above   - medication changes as noted above; have stressed to the patient and her family the importance of updating her pill box and to call the hospital/pharmacy/PCP regarding any questions on her new medications and/or dosing.   - follow up with PCP in 1 week or sooner if needed and other specialists as previously scheduled   - recommend repeating BMP, Mag in one week to monitor potassium levels and kidney function with addition of Aldactone   - return to the ER if new or worsening symptoms       I spent a total of 45 minutes in direct/indirect care of this patient including initial evaluation, review of laboratory, radiology, diagnostic studies, review of medical record, order entry and coordination of care.      Janann August, DO  08/12/2023, 16:19  Internal Medicine, Hospitalist

## 2023-08-13 ENCOUNTER — Telehealth (HOSPITAL_COMMUNITY): Payer: Self-pay | Admitting: NURSE PRACTITIONER, FAMILY

## 2023-08-13 DIAGNOSIS — R7989 Other specified abnormal findings of blood chemistry: Secondary | ICD-10-CM | POA: Insufficient documentation

## 2023-08-13 DIAGNOSIS — R9431 Abnormal electrocardiogram [ECG] [EKG]: Secondary | ICD-10-CM | POA: Insufficient documentation

## 2023-08-13 DIAGNOSIS — I10 Essential (primary) hypertension: Secondary | ICD-10-CM

## 2023-08-13 DIAGNOSIS — Z9181 History of falling: Secondary | ICD-10-CM

## 2023-08-13 DIAGNOSIS — I5023 Acute on chronic systolic (congestive) heart failure: Secondary | ICD-10-CM

## 2023-08-13 DIAGNOSIS — R54 Age-related physical debility: Secondary | ICD-10-CM

## 2023-08-13 LAB — ECG 12-LEAD
Calculated R Axis: -9 degrees
Calculated T Axis: 21 degrees
QRS Duration: 102 ms
QT Interval: 458 ms
QTC Calculation: 438 ms
Ventricular rate: 55 {beats}/min

## 2023-08-13 NOTE — Telephone Encounter (Signed)
Decided would like home health services.  Will be greatly helpful in medication management-prevention medication errors, monitoring exacerbation of chronic heart failure, fall risk prevention.

## 2023-08-13 NOTE — Care Management Notes (Signed)
[facility]  Care Management Note    Patient Name: [name]  Date of Birth: 09-28-1929  Sex: female  Date/Time of Admission: 08/11/2023 11:23 AM  Room/Bed: 107/A  Payor: [employer]RO[employer] MEDICARE / Plan: [employer]IL[employer] MEDICARE / Product Type: Medicare /    LOS: 0 days   Primary Care Providers:  Marcelino Freestone, FNP, FNP (General)    Admitting Diagnosis:  CHF exacerbation (CMS HCC) [I50.9]  Acute on chronic HFrEF (heart failure with reduced ejection fraction) (CMS HCC) [I50.23]    Assessment:     SW was contacted by Angela [facility] of [facility] requesting a HH order for SN and PT services.      SW contacted the pt's daughter via phone.  Daughter agreed to the [facility] services only and provided consent to SW via phone to send order for SN services to [facility].  They do not want PT services    SW contacted providers requesting HH order for SN services only.         Discharge Plan:  Home with Home Health (code 6)      The patient will continue to be evaluated for developing discharge needs.     Case Manager: Vivia Budge, SOCIAL WORKER

## 2023-08-14 ENCOUNTER — Other Ambulatory Visit (HOSPITAL_COMMUNITY): Payer: Self-pay | Admitting: NURSE PRACTITIONER, FAMILY

## 2023-08-19 ENCOUNTER — Other Ambulatory Visit (HOSPITAL_COMMUNITY): Payer: Self-pay | Admitting: NURSE PRACTITIONER, FAMILY

## 2023-08-25 ENCOUNTER — Ambulatory Visit (HOSPITAL_COMMUNITY): Payer: Self-pay

## 2023-08-26 ENCOUNTER — Ambulatory Visit (HOSPITAL_COMMUNITY): Payer: Self-pay

## 2023-08-31 LAB — 14 DAY EXTENDED HOLTER MONITOR
Atrial fibrillation - heart rate (average): 60 {beats}/min
Atrial fibrillation burden: 100 %
Atrial fibrillation with fastest heart rate - heart rate (a: 73 {beats}/min
Atrial fibrillation with slowest heart rate - heart rate (a: 50 {beats}/min
Heart rate (average): 60 {beats}/min
Isolated VE Counts: 8588 episodes
Longest bigeminy - duration: 14.1 s
Longest trigeminy - duration: 9 s
Longest ventricular tachycardia episode - duration: 3.5
Longest ventricular tachycardia episode - heart rate (avera: 101
Longest ventricular tachycardia episode - number of beats: 5 beats
Pause - number of episodes: 13
VE Couplets Counts: 74 episodes
VE Triplets Counts: 1 episodes
Ventricular tachycardia - heart rate (average): 106 {beats}/min
Ventricular tachycardia - number of episodes: 3
Ventricular tachycardia with fastest heart rate - duration: 2.9 s
Ventricular tachycardia with fastest heart rate - heart rat: 103
Ventricular tachycardia with fastest heart rate - number of: 4 beats

## 2023-09-04 ENCOUNTER — Other Ambulatory Visit (HOSPITAL_COMMUNITY): Payer: Self-pay | Admitting: NURSE PRACTITIONER, FAMILY

## 2023-09-04 NOTE — Telephone Encounter (Signed)
Not our patient

## 2023-09-16 ENCOUNTER — Other Ambulatory Visit (HOSPITAL_COMMUNITY): Payer: Self-pay | Admitting: NURSE PRACTITIONER, FAMILY

## 2023-10-21 ENCOUNTER — Emergency Department (HOSPITAL_COMMUNITY): Payer: MEDICARE

## 2023-10-21 ENCOUNTER — Encounter (HOSPITAL_COMMUNITY): Payer: Self-pay

## 2023-10-21 ENCOUNTER — Inpatient Hospital Stay
Admission: EM | Admit: 2023-10-21 | Discharge: 2023-11-28 | DRG: 065 | Disposition: E | Payer: MEDICARE | Attending: Family Medicine | Admitting: Family Medicine

## 2023-10-21 ENCOUNTER — Inpatient Hospital Stay (HOSPITAL_COMMUNITY): Payer: MEDICARE | Admitting: Family Medicine

## 2023-10-21 ENCOUNTER — Other Ambulatory Visit: Payer: Self-pay

## 2023-10-21 DIAGNOSIS — Z7984 Long term (current) use of oral hypoglycemic drugs: Secondary | ICD-10-CM

## 2023-10-21 DIAGNOSIS — N179 Acute kidney failure, unspecified: Secondary | ICD-10-CM | POA: Insufficient documentation

## 2023-10-21 DIAGNOSIS — I252 Old myocardial infarction: Secondary | ICD-10-CM

## 2023-10-21 DIAGNOSIS — E860 Dehydration: Secondary | ICD-10-CM | POA: Diagnosis present

## 2023-10-21 DIAGNOSIS — Z66 Do not resuscitate: Secondary | ICD-10-CM | POA: Diagnosis not present

## 2023-10-21 DIAGNOSIS — Z955 Presence of coronary angioplasty implant and graft: Secondary | ICD-10-CM

## 2023-10-21 DIAGNOSIS — I63233 Cerebral infarction due to unspecified occlusion or stenosis of bilateral carotid arteries: Principal | ICD-10-CM | POA: Diagnosis present

## 2023-10-21 DIAGNOSIS — E870 Hyperosmolality and hypernatremia: Secondary | ICD-10-CM | POA: Diagnosis present

## 2023-10-21 DIAGNOSIS — R0600 Dyspnea, unspecified: Principal | ICD-10-CM | POA: Insufficient documentation

## 2023-10-21 DIAGNOSIS — N390 Urinary tract infection, site not specified: Secondary | ICD-10-CM | POA: Diagnosis present

## 2023-10-21 DIAGNOSIS — E785 Hyperlipidemia, unspecified: Secondary | ICD-10-CM | POA: Diagnosis present

## 2023-10-21 DIAGNOSIS — I447 Left bundle-branch block, unspecified: Secondary | ICD-10-CM | POA: Diagnosis present

## 2023-10-21 DIAGNOSIS — I2489 Other forms of acute ischemic heart disease: Secondary | ICD-10-CM | POA: Diagnosis present

## 2023-10-21 DIAGNOSIS — I4891 Unspecified atrial fibrillation: Secondary | ICD-10-CM | POA: Diagnosis present

## 2023-10-21 DIAGNOSIS — I6320 Cerebral infarction due to unspecified occlusion or stenosis of unspecified precerebral arteries: Secondary | ICD-10-CM | POA: Diagnosis present

## 2023-10-21 DIAGNOSIS — R7989 Other specified abnormal findings of blood chemistry: Secondary | ICD-10-CM | POA: Insufficient documentation

## 2023-10-21 DIAGNOSIS — E039 Hypothyroidism, unspecified: Secondary | ICD-10-CM | POA: Diagnosis present

## 2023-10-21 DIAGNOSIS — I639 Cerebral infarction, unspecified: Secondary | ICD-10-CM

## 2023-10-21 DIAGNOSIS — R1310 Dysphagia, unspecified: Secondary | ICD-10-CM | POA: Diagnosis present

## 2023-10-21 DIAGNOSIS — Z515 Encounter for palliative care: Secondary | ICD-10-CM

## 2023-10-21 DIAGNOSIS — I10 Essential (primary) hypertension: Secondary | ICD-10-CM | POA: Diagnosis present

## 2023-10-21 DIAGNOSIS — Z1152 Encounter for screening for COVID-19: Secondary | ICD-10-CM | POA: Insufficient documentation

## 2023-10-21 DIAGNOSIS — Z79899 Other long term (current) drug therapy: Secondary | ICD-10-CM

## 2023-10-21 LAB — CBC WITH DIFF
BASOPHIL #: <0.1 10*3/uL (ref <=0.20)
BASOPHIL %: 0.7 %
EOSINOPHIL #: 0.3 10*3/uL (ref <=0.50)
EOSINOPHIL %: 3.2 %
HCT: 38.2 % (ref 34.8–46.0)
HGB: 12.7 g/dL (ref 11.5–16.0)
IMMATURE GRANULOCYTE #: <0.1 10*3/uL (ref <0.10)
IMMATURE GRANULOCYTE %: 0.3 % (ref 0.0–1.0)
LYMPHOCYTE #: 1.9 10*3/uL (ref 1.00–4.80)
LYMPHOCYTE %: 20.3 %
MCH: 29.5 pg (ref 26.0–32.0)
MCHC: 33.2 g/dL (ref 31.0–35.5)
MCV: 88.6 fL (ref 78.0–100.0)
MONOCYTE #: 1.1 10*3/uL (ref 0.20–1.10)
MONOCYTE %: 11.7 %
MPV: 9.2 fL (ref 8.7–12.5)
NEUTROPHIL #: 5.98 10*3/uL (ref 1.50–7.70)
NEUTROPHIL %: 63.8 %
PLATELETS: 262 10*3/uL (ref 150–400)
RBC: 4.31 10*6/uL (ref 3.85–5.22)
RDW-CV: 15.1 % (ref 11.5–15.5)
WBC: 9.4 10*3/uL (ref 3.7–11.0)

## 2023-10-21 LAB — ECG 12-LEAD
Atrial Rate: 73 {beats}/min
Calculated P Axis: 48 degrees
Calculated R Axis: -43 degrees
Calculated T Axis: 5 degrees
PR Interval: 148 ms
QRS Duration: 116 ms
QT Interval: 432 ms
QTC Calculation: 475 ms
Ventricular rate: 73 {beats}/min

## 2023-10-21 LAB — COMPREHENSIVE METABOLIC PANEL, NON-FASTING
ALBUMIN: 2.9 g/dL — ABNORMAL LOW (ref 3.4–4.8)
ALKALINE PHOSPHATASE: 56 U/L (ref 55–145)
ALT (SGPT): 17 U/L (ref <31)
ANION GAP: 11 mmol/L (ref 4–13)
AST (SGOT): 37 U/L — ABNORMAL HIGH (ref 11–34)
BILIRUBIN TOTAL: 1.1 mg/dL (ref 0.3–1.3)
BUN/CREA RATIO: 23 — ABNORMAL HIGH (ref 6–22)
BUN: 67 mg/dL — ABNORMAL HIGH (ref 8–25)
CALCIUM: 10.4 mg/dL — ABNORMAL HIGH (ref 8.6–10.3)
CHLORIDE: 111 mmol/L (ref 96–111)
CO2 TOTAL: 20 mmol/L — ABNORMAL LOW (ref 23–31)
CREATININE: 2.96 mg/dL — ABNORMAL HIGH (ref 0.60–1.05)
ESTIMATED GFR - FEMALE: 14 mL/min/BSA — ABNORMAL LOW (ref >=60)
GLUCOSE: 103 mg/dL (ref 65–125)
POTASSIUM: 4.2 mmol/L (ref 3.5–5.1)
PROTEIN TOTAL: 7.1 g/dL (ref 6.0–8.0)
SODIUM: 142 mmol/L (ref 136–145)

## 2023-10-21 LAB — URINALYSIS, MACRO/MICRO
BILIRUBIN: NEGATIVE mg/dL
BILIRUBIN: NEGATIVE mg/dL
GLUCOSE: NEGATIVE mg/dL
GLUCOSE: NEGATIVE mg/dL
KETONES: NEGATIVE mg/dL
KETONES: NEGATIVE mg/dL
LEUKOCYTES: NEGATIVE WBCs/uL
LEUKOCYTES: NEGATIVE WBCs/uL
NITRITE: NEGATIVE
NITRITE: NEGATIVE
PH: 7 (ref 5.0–8.5)
PH: 6.5 (ref 5.0–8.5)
PROTEIN: NEGATIVE mg/dL
PROTEIN: 100 mg/dL — AB
SPECIFIC GRAVITY: 1.01 (ref 1.005–1.030)
SPECIFIC GRAVITY: 1.01 (ref 1.005–1.030)
UROBILINOGEN: 0.2 mg/dL
UROBILINOGEN: 0.2 mg/dL

## 2023-10-21 LAB — COVID-19 ~~LOC~~ MOLECULAR LAB TESTING
INFLUENZA VIRUS TYPE A: NOT DETECTED
INFLUENZA VIRUS TYPE B: NOT DETECTED
RESPIRATORY SYNCTIAL VIRUS (RSV): NOT DETECTED
SARS-CoV-2: NOT DETECTED

## 2023-10-21 LAB — URINALYSIS, MICROSCOPIC

## 2023-10-21 LAB — BLUE TOP TUBE

## 2023-10-21 LAB — GRAY TOP TUBE

## 2023-10-21 LAB — BNP, LAB ISTAT: BNP, POC: 1464 — ABNORMAL HIGH (ref <=100)

## 2023-10-21 LAB — TROPONIN-I
TROPONIN-I HS: 123.8 ng/L (ref <=14.0)
TROPONIN-I HS: 93 ng/L (ref <=14.0)

## 2023-10-21 LAB — GOLD TOP TUBE

## 2023-10-21 MED ORDER — ACETAMINOPHEN 325 MG TABLET
650.0000 mg | ORAL_TABLET | ORAL | Status: DC | PRN
Start: 2023-10-21 — End: 2023-10-26

## 2023-10-21 MED ORDER — ISOSORBIDE MONONITRATE ER 30 MG TABLET,EXTENDED RELEASE 24 HR
60.0000 mg | ORAL_TABLET | Freq: Every day | ORAL | Status: DC
Start: 2023-10-21 — End: 2023-10-26
  Administered 2023-10-21 – 2023-10-24 (×4): 0 mg via ORAL
  Filled 2023-10-21: qty 2

## 2023-10-21 MED ORDER — ALUMINUM-MAG HYDROXIDE-SIMETHICONE 200 MG-200 MG-20 MG/5 ML ORAL SUSP
20.0000 mL | ORAL | Status: DC | PRN
Start: 2023-10-21 — End: 2023-10-26

## 2023-10-21 MED ORDER — SODIUM CHLORIDE 0.9 % (FLUSH) INJECTION SYRINGE
10.0000 mL | INJECTION | Freq: Three times a day (TID) | INTRAMUSCULAR | Status: DC
Start: 2023-10-21 — End: 2023-10-26
  Administered 2023-10-21 (×2): 0 mL
  Administered 2023-10-22 (×2): 10 mL
  Administered 2023-10-22 – 2023-10-26 (×12): 0 mL

## 2023-10-21 MED ORDER — APIXABAN 2.5 MG TABLET
2.5000 mg | ORAL_TABLET | Freq: Two times a day (BID) | ORAL | Status: DC
Start: 2023-10-21 — End: 2023-10-26
  Administered 2023-10-21 – 2023-10-23 (×4): 0 mg via ORAL
  Filled 2023-10-21: qty 1

## 2023-10-21 MED ORDER — ASPIRIN 81 MG TABLET,DELAYED RELEASE
81.0000 mg | DELAYED_RELEASE_TABLET | Freq: Every day | ORAL | Status: DC
Start: 2023-10-21 — End: 2023-10-26
  Administered 2023-10-21 – 2023-10-24 (×4): 0 mg via ORAL
  Filled 2023-10-21: qty 1

## 2023-10-21 MED ORDER — ATORVASTATIN 20 MG TABLET
20.0000 mg | ORAL_TABLET | Freq: Every evening | ORAL | Status: DC
Start: 2023-10-21 — End: 2023-10-26
  Administered 2023-10-21 – 2023-10-23 (×3): 0 mg via ORAL
  Filled 2023-10-21: qty 1

## 2023-10-21 MED ORDER — SODIUM CHLORIDE 0.9 % (FLUSH) INJECTION SYRINGE
10.0000 mL | INJECTION | INTRAMUSCULAR | Status: DC | PRN
Start: 2023-10-21 — End: 2023-10-26
  Administered 2023-10-23: 10 mL

## 2023-10-21 MED ORDER — SODIUM CHLORIDE 0.9 % INTRAVENOUS SOLUTION
INTRAVENOUS | Status: DC
Start: 2023-10-21 — End: 2023-10-22
  Administered 2023-10-21: 1000 mL via INTRAVENOUS
  Administered 2023-10-22: 0 mL via INTRAVENOUS

## 2023-10-21 MED ORDER — LEVOTHYROXINE 125 MCG TABLET
125.0000 ug | ORAL_TABLET | Freq: Every day | ORAL | Status: DC
Start: 2023-10-22 — End: 2023-10-26
  Administered 2023-10-22 – 2023-10-24 (×3): 0 ug via ORAL

## 2023-10-21 MED ORDER — ONDANSETRON HCL (PF) 4 MG/2 ML INJECTION SOLUTION
4.0000 mg | Freq: Four times a day (QID) | INTRAMUSCULAR | Status: DC | PRN
Start: 2023-10-21 — End: 2023-10-26

## 2023-10-21 MED ORDER — CARVEDILOL 3.125 MG TABLET
3.1250 mg | ORAL_TABLET | Freq: Two times a day (BID) | ORAL | Status: DC
Start: 2023-10-21 — End: 2023-10-21

## 2023-10-21 MED ORDER — METOPROLOL SUCCINATE ER 25 MG TABLET,EXTENDED RELEASE 24 HR
25.0000 mg | ORAL_TABLET | Freq: Every day | ORAL | Status: DC
Start: 2023-10-21 — End: 2023-10-26
  Administered 2023-10-21 – 2023-10-24 (×4): 0 mg via ORAL
  Filled 2023-10-21: qty 1

## 2023-10-21 MED ORDER — MAGNESIUM HYDROXIDE 400 MG/5 ML ORAL SUSPENSION
15.0000 mL | Freq: Every day | ORAL | Status: DC | PRN
Start: 2023-10-21 — End: 2023-10-26

## 2023-10-21 NOTE — ED Attending Handoff Note (Signed)
Emergency Medicine  Attending Only Note    Name: [name]  Age and Gender: 89 y.o. female  Date of Birth: Apr 22, 1929  MRN: [id number]    ED Course Since Sign Out:  Has an AKI.  Awaiting rest of workup at time of handoff to myself.    Cts of T and L spine, CT of head without acute findings.    Patient confused.  She will sporadically sit up in bed and breathe rapidly, this seems to be anxiety driven, otherwise is breathing comfortably and lungs sound fine.    Will admit for management of her AKI and confusion, I spoke to hospitalist for admission.      Filed Vitals:    10/21/23 0535 10/21/23 0545 10/21/23 0902 10/21/23 1000   BP: (!) 194/63 (!) 179/78 110/83 (!) 149/115   Pulse: 64 65 84 85   Resp: (!) 25 (!) 23 20 19    Temp: 36.1 C (97 F)      SpO2: 99% 96% 95%      Impression:   Clinical Impression   Dyspnea, unspecified type (Primary)   Acute kidney injury (CMS HCC)   Elevated troponin I level       Disposition: Admitted      Portions of this note may have been dictated using voice recognition software.     Truddie Coco, MD  [facility] Department of Emergency Medicine

## 2023-10-21 NOTE — Care Plan (Signed)
Problem: Acute Kidney Injury/Impairment  Goal: Fluid and Electrolyte Balance  Outcome: Ongoing (see interventions/notes)     Problem: Cardiac Output Decreased  Goal: Effective Cardiac Output  Outcome: Ongoing (see interventions/notes)     Problem: Heart Failure  Goal: Optimal Coping  Outcome: Ongoing (see interventions/notes)  Intervention: Support Psychosocial Response  Recent Flowsheet Documentation  Taken 10/21/2023 1600 by Jaci Lazier, RN  Family/Support System Care: caregiver stress acknowledged    Care plan initiated.     Jaci Lazier, RN

## 2023-10-21 NOTE — H&P (Signed)
[facility]  Hospitalist  History & Physical    Date of Service:  10/21/2023  Leamy, [name], 89 y.o. female  Date of Admission:  10/21/2023  Date of Birth:  05-23-1929  PCP: Marcelino Freestone, FNP    Chief Complaint:  Altered mental status    HPI:  [name] is a 89 y.o. White female who is admitted for acute kidney injury and altered mental status.  Patient is seen with her daughter.  Daughter reports patient is usually ambulatory and talkative at home.  The daughter reported that yesterday evening patient was found time to go into her granddaughter is room and was difficult to redirect.  It appeared that she was having some difficulty breathing was breathing heavily.  Some changes were made by Cardiology to her diuretics 2 days ago.  Patient's torsemide was discontinued and patient was started on Bumex 1 mg twice a day because of an elevated proBNP.  Patient was brought to the emergency room for evaluation.  In the emergency room laboratory studies and diagnostic imaging was obtained.  Chest x-ray showed no acute pulmonary process.  CT of the brain showed no acute intracranial abnormality.  CT of the thoracic and lumbar spine showed no acute fracture.  Troponin was slightly elevated at 123.8.  Repeat troponin was trending downward at 93.  Patient had a BUN of 67 and creatinine of 2.96.  Baseline creatinine is around 1.2.  Patient does have significant altered mental status and unable to answer any questions at the time of my exam.  Patient keeps eyes closed but is wiggling in the bed unable to keep her feet on the bed.  Patient keeps moaning as if in pain.  Patient will be placed under hospitalist service for continued medical management and treatment of AKI.    Past Medical History:   Diagnosis Date    Arthropathy     Cancer (CMS HCC)     breast    Heart murmur     HTN (hypertension)     MI (myocardial infarction) (CMS HCC)     Thyroid disease       Past Surgical History:   Procedure  Laterality Date    HX CORONARY STENT PLACEMENT      HX KNEE SURGERY      HX LYMPH NODE DISSECTION Right       Social History     Tobacco Use    Smoking status: Never    Smokeless tobacco: Never   Vaping Use    Vaping status: Never Used   Substance Use Topics    Alcohol use: Never    Drug use: Never       Family Medical History:    None        Medications Prior to Admission       Prescriptions    apixaban (ELIQUIS) 2.5 mg Oral Tablet    Take 1 Tablet (2.5 mg total) by mouth Twice daily    aspirin (ECOTRIN) 81 mg Oral Tablet, Delayed Release (E.C.)    Take 1 Tablet (81 mg total) by mouth Once a day    carvediloL (COREG) 3.125 mg Oral Tablet    Take 1 Tablet (3.125 mg total) by mouth Twice daily with food    COMBIGAN 0.2-0.5 % Ophthalmic Drops    INSTILL 1 DROP IN BOTH EYES TWICE DAILY    cyanocobalamin (VITAMIN B-12) 500 mcg Oral Tablet    Take 1 Tablet (500 mcg total) by mouth  Once a day    empagliflozin (JARDIANCE) 10 mg Oral Tablet    Take 1 Tablet (10 mg total) by mouth Once a day    ferrous sulfate 220 mg (44 mg iron)/5 mL Oral Elixir    Take 5 mL (44 mg total) by mouth    HYDRAZINE SULFATE, BULK, N/A    1 Tablet Twice daily    isosorbide mononitrate (IMDUR) 60 mg Oral Tablet Sustained Release 24 hr    TAKE 1 TABLET BY MOUTH ONCE DAILY    latanoprost (XALATAN) 0.005 % Ophthalmic Drops    Instill 1 Drop into both eyes Every evening    levothyroxine (SYNTHROID) 125 mcg Oral Tablet    Take 1 Tablet (125 mcg total) by mouth Once a day    losartan (COZAAR) 100 mg Oral Tablet    Take 1 Tablet (100 mg total) by mouth Once a day for 30 days    metoprolol succinate (TOPROL-XL) 25 mg Oral Tablet Sustained Release 24 hr    Take 1 Tablet (25 mg total) by mouth Once a day    Multivitamins with Fluoride (MULTI-VITAMIN PO)    Take by mouth    simvastatin (ZOCOR) 40 mg Oral Tablet    Take 1 Tablet (40 mg total) by mouth Once a day    spironolactone (ALDACTONE) 25 mg Oral Tablet    Take 1 Tablet (25 mg total) by mouth Every  morning with breakfast for 30 days    vit A/vit C/vit E/zinc/copper (PRESERVISION AREDS ORAL)    Take 1 Tablet by mouth Twice daily           No Known Allergies     Advanced Directives:      Review of Systems   Unable to perform ROS: Mental status change          Filed Vitals:    10/21/23 0902 10/21/23 1000 10/21/23 1345 10/21/23 1430   BP: 110/83 (!) 149/115 (!) 197/86 (!) 191/89   Pulse: 84 85 82 76   Resp: 20 19 (!) 23 (!) 23   Temp:       SpO2: 95%   91%       Physical Exam  Vitals and nursing note reviewed.   Constitutional:       General: She is not in acute distress.     Appearance: Normal appearance. She is ill-appearing. She is not diaphoretic.   HENT:      Head: Normocephalic and atraumatic.      Right Ear: External ear normal.      Left Ear: External ear normal.      Nose: Nose normal.      Mouth/Throat:      Mouth: Mucous membranes are moist.   Eyes:      General: No scleral icterus.     Conjunctiva/sclera: Conjunctivae normal.      Pupils: Pupils are equal, round, and reactive to light.   Cardiovascular:      Rate and Rhythm: Normal rate and regular rhythm.      Heart sounds: Normal heart sounds. No murmur heard.     No friction rub. No gallop.   Pulmonary:      Effort: Pulmonary effort is normal. No respiratory distress.      Breath sounds: Normal breath sounds. No wheezing or rales.   Chest:      Chest wall: No tenderness.   Abdominal:      General: Bowel sounds are normal. There is no distension.  Palpations: Abdomen is soft. There is no mass.      Tenderness: There is no abdominal tenderness. There is no guarding or rebound.   Musculoskeletal:         General: Normal range of motion.      Cervical back: Normal range of motion and neck supple.   Skin:     General: Skin is warm and dry.      Findings: No erythema or rash.   Neurological:      Mental Status: She is confused.      Cranial Nerves: No cranial nerve deficit.   Psychiatric:         Attention and Perception: She is inattentive.          Mood and Affect: Mood and affect normal.         Speech: She is noncommunicative.         Behavior: Behavior is uncooperative and withdrawn.         Cognition and Memory: Cognition is impaired. Memory is impaired.          Laboratory Data:     Admission on 10/21/2023   Component Date Value    SODIUM 10/21/2023 142     POTASSIUM 10/21/2023 4.2     CHLORIDE 10/21/2023 111     CO2 TOTAL 10/21/2023 20 (L)     ANION GAP 10/21/2023 11     BUN 10/21/2023 67 (H)     CREATININE 10/21/2023 2.96 (H)     BUN/CREA RATIO 10/21/2023 23 (H)     ALBUMIN 10/21/2023 2.9 (L)     CALCIUM 10/21/2023 10.4 (H)     GLUCOSE 10/21/2023 103     ALKALINE PHOSPHATASE 10/21/2023 56     ALT (SGPT) 10/21/2023 17     AST (SGOT)  10/21/2023 37 (H)     BILIRUBIN TOTAL 10/21/2023 1.1     PROTEIN TOTAL 10/21/2023 7.1     ESTIMATED GFR - FEMALE 10/21/2023 14 (L)     TROPONIN-I HS 10/21/2023 123.8 (HH)     Ventricular rate 10/21/2023 73     Atrial Rate 10/21/2023 73     PR Interval 10/21/2023 148     QRS Duration 10/21/2023 116     QT Interval 10/21/2023 432     QTC Calculation 10/21/2023 475     Calculated P Axis 10/21/2023 48     Calculated R Axis 10/21/2023 -43     Calculated T Axis 10/21/2023 5     SARS-CoV-2 10/21/2023 Not Detected     INFLUENZA VIRUS TYPE A 10/21/2023 Not Detected     INFLUENZA VIRUS TYPE B 10/21/2023 Not Detected     RESPIRATORY SYNCTIAL VIR* 10/21/2023 Not Detected     WBC 10/21/2023 9.4     RBC 10/21/2023 4.31     HGB 10/21/2023 12.7     HCT 10/21/2023 38.2     MCV 10/21/2023 88.6     MCH 10/21/2023 29.5     MCHC 10/21/2023 33.2     RDW-CV 10/21/2023 15.1     PLATELETS 10/21/2023 262     MPV 10/21/2023 9.2     NEUTROPHIL % 10/21/2023 63.8     LYMPHOCYTE % 10/21/2023 20.3     MONOCYTE % 10/21/2023 11.7     EOSINOPHIL % 10/21/2023 3.2     BASOPHIL % 10/21/2023 0.7     NEUTROPHIL # 10/21/2023 5.98     LYMPHOCYTE # 10/21/2023 1.90     MONOCYTE # 10/21/2023 1.10  EOSINOPHIL # 10/21/2023 0.30     BASOPHIL # 10/21/2023 <0.10      IMMATURE GRANULOCYTE % 10/21/2023 0.3     IMMATURE GRANULOCYTE # 10/21/2023 <0.10     SPECIFIC GRAVITY 10/21/2023 1.010     GLUCOSE 10/21/2023 Negative     PROTEIN 10/21/2023 Negative     BILIRUBIN 10/21/2023 Negative     UROBILINOGEN 10/21/2023 0.2     PH 10/21/2023 7.0     BLOOD 10/21/2023 Trace (A)     KETONES 10/21/2023 Negative     NITRITE 10/21/2023 Negative     LEUKOCYTES 10/21/2023 Negative     APPEARANCE 10/21/2023 Clear     COLOR 10/21/2023 Yellow     RAINBOW/EXTRA TUBE AUTO * 10/21/2023 Yes     RAINBOW/EXTRA TUBE AUTO * 10/21/2023 Yes     RAINBOW/EXTRA TUBE AUTO * 10/21/2023 Yes     BNP, POC 10/21/2023 1,464 (H)     RBCS 10/21/2023 3-5     WBCS 10/21/2023 0-2     BACTERIA 10/21/2023 None     SQUAMOUS EPITHELIAL 10/21/2023 3-5     RENAL EPITHELIAL 10/21/2023 3-5     AMORPHOUS SEDIMENT 10/21/2023 Rare (A)     TROPONIN-I HS 10/21/2023 93.0 (HH)         Imaging Studies:    CT LUMBAR SPINE WO IV CONTRAST   Final Result by Edi, Radresults In (12/25 0720)      No acute fracture of the lumbar spine.      Multilevel lumbar spondylosis. See description at each specific level above.      Mild scoliosis.          The CT exam was performed using one or more of the following dose reduction techniques: Automated exposure control, adjustment of the mA and/or kV according to the patient's size, or use of iterative reconstruction technique.               Radiologist location ID: [id number]         CT THORACIC SPINE WO IV CONTRAST   Final Result by Edi, Radresults In (12/25 8295)      No acute fracture or subluxation of the thoracic spine.      Moderate scoliosis. Mild multilevel degenerative change.      Mild motion artifact.             The CT exam was performed using one or more of the following dose reduction techniques: Automated exposure control, adjustment of the mA and/or kV according to the patient's size, or use of iterative reconstruction technique.               Radiologist location ID: [id number]         CT  BRAIN WO IV CONTRAST   Final Result by Edi, Radresults In (12/25 0707)   No acute intracranial abnormality.               The CT exam was performed using one or more of the following dose reduction techniques: Automated exposure control, adjustment of the mA and/or kV according to the patient's size, or use of iterative reconstruction technique.               Radiologist location ID: [id number]         XR AP MOBILE CHEST   Final Result by Edi, Radresults In (12/25 0706)      No acute cardiopulmonary process. Chronic changes.  Radiologist location ID: [id number]             Assessment/Plan:  Active Hospital Problems    Diagnosis    Primary Problem: AKI (acute kidney injury) (CMS HCC)    Dehydration    Atrial fibrillation (CMS HCC)    Essential hypertension    Hyperlipidemia    Hypothyroidism       1. Acute kidney injury.  Patient was placed under hospitalist service.  IV fluids will be started 125 mL/hour.  Repeat laboratory studies daily and closely follow creatinine.  Patient's home medications have been reviewed and Jardiance, Cozaar, Aldactone and Bumex will not be given.  2. Dehydration.  Same treatment as 1.   3. Altered mental status.  Uncertain etiology.  Will treat AKI and closely monitor patient.  Consider MRI if patient's symptoms do not improve.    4. Atrial fibrillation.  Continue Eliquis and metoprolol.    5. Hyperlipidemia.  Continue Lipitor.    6. Hypothyroidism.  Continue Synthroid.    7. deep vein thrombosis prophylaxis.  Eliquis b.i.d..    Estimated Length of Stay:  2-3 days    I spent a total of (30) minutes in direct/indirect care of this patient including initial evaluation, review of laboratory, radiology, diagnostic studies, review of medical record, order entry and coordination of care.     Ronnald Nian, PA-C      I personally saw and evaluated the patient as part of a shared service with an APP.    My substantive findings are:  SUBSTANTIVE FINDINGS: MDM (complete).  Patient  admitted to the hospital for acute kidney injury.  The patient be started on IV fluids at 125 mL/hour.  Will monitor urine output and renal function.  Repeat lab work in the morning.     I independently of the APP spent a total of (15) minutes in direct/indirect care of this patient including initial evaluation, review of laboratory, radiology, diagnostic studies, review of medical record, order entry and coordination of care.     Leonarda Salon, MD  10/21/2023, 16:42

## 2023-10-21 NOTE — Nurses Notes (Signed)
Patient admitted to room 1136. Patient is unable to speak, confused, pulling on IV, moaning and thrashing around the bed. Daughter at bedside and states that this in not the patients baseline. IV NS initiated at 125 ml/hr. Patients daughter oriented to room and call light.     Jaci Lazier, RN

## 2023-10-21 NOTE — Nurses Notes (Signed)
16 fr foley cath inserted using aseptic technique. Patient tolerated well. Immediate dark coffee colored urine obtained. Approximately of urine drained within a few minutes. Patient appears more comfortable. Urine specimen collected and sent to lab. Elberta Spaniel notified.     Jaci Lazier, RN

## 2023-10-21 NOTE — Respiratory Therapy (Signed)
No acute respiratory distress noted.

## 2023-10-21 NOTE — ED Nurses Note (Signed)
Per Daughter, patient has been c/o of back pain, "breathing hard", confusion, was well at 2200 when patient went to bed, patient restless per daughter

## 2023-10-21 NOTE — ED Nurses Note (Signed)
Report given to Alroy Dust, RN

## 2023-10-21 NOTE — ED Nurses Note (Incomplete)
Pt readjusted in bed. Clean brief and dry sheets applied.  Daughter at bedside.  Call bell within reach.  Pt's daughter requests to speak with Dr.Schneider

## 2023-10-21 NOTE — ED Nurses Note (Signed)
Report called to Lorin Picket, RN in ICU. Nurse will call when ready for patient.

## 2023-10-21 NOTE — ED Nurses Note (Signed)
MD in t ospeak with family. Trop to be redrawn. Pt to be admitted

## 2023-10-21 NOTE — ED Nurses Note (Signed)
Pt restless, family at bedside. Trop resulted. Awaiting plan

## 2023-10-21 NOTE — ED Nurses Note (Signed)
Report called to [facility] on Medsurg. Patient transported to Medsurg via stretcher accompanied by Charlies Constable RN

## 2023-10-21 NOTE — ED Provider Notes (Signed)
Emergency Department  [facility]   10/21/2023     [name]  01-17-1929  89 y.o.  female  [place] [address]   [phone] (home)  PCP: Marcelino Freestone, FNP   [phone]    Date of service:10/21/2023 05:53    Chief Complaint:   Chief Complaint   Patient presents with    Cough     Call per [facility] for breathing difficulty; patient not answering questions; family called ambulance       Arrival: The patient arrived by Ambulance and is accompanied by daughter    HPI: This is a 89 y.o. female who presents to the emergency department due to confusion and difficulty breathing.  Daughter reports earlier this evening patient was found trying to go into granddaughter's bedroom.  Difficult to redirect.  Patient seemed to be breathing heavily.  Not acting like her normal self per daughter.  Daughter reports she was seen by [facility] cardiology 2 days ago and was told that the blood work showed "water on her kidneys" and a change in her diuretic was made.  Records from [facility] cardiology show patient is torsemide was discontinued and patient was started on Bumetanide1 mg twice a day after being found to have an elevated pro BNP.    Past Medical History:   Past Medical History:   Diagnosis Date    Arthropathy     Cancer (CMS HCC)     breast    Heart murmur     HTN (hypertension)     MI (myocardial infarction) (CMS HCC)     Thyroid disease        Past Surgical History:   Past Surgical History:   Procedure Laterality Date    Hx coronary stent placement      Hx knee surgery      Hx lymph node dissection Right        Social History:   Social History     Tobacco Use    Smoking status: Never    Smokeless tobacco: Never   Vaping Use    Vaping status: Never Used   Substance Use Topics    Alcohol use: Never    Drug use: Never        Family History:  No family history on file.        Medications Prior to Admission       Prescriptions    apixaban (ELIQUIS) 2.5 mg  Oral Tablet    Take 1 Tablet (2.5 mg total) by mouth Twice daily    aspirin (ECOTRIN) 81 mg Oral Tablet, Delayed Release (E.C.)    Take 1 Tablet (81 mg total) by mouth Once a day    carvediloL (COREG) 3.125 mg Oral Tablet    Take 1 Tablet (3.125 mg total) by mouth Twice daily with food    COMBIGAN 0.2-0.5 % Ophthalmic Drops    INSTILL 1 DROP IN BOTH EYES TWICE DAILY    cyanocobalamin (VITAMIN B-12) 500 mcg Oral Tablet    Take 1 Tablet (500 mcg total) by mouth Once a day    empagliflozin (JARDIANCE) 10 mg Oral Tablet    Take 1 Tablet (10 mg total) by mouth Once a day    ferrous sulfate 220 mg (44 mg iron)/5 mL Oral Elixir    Take 5 mL (44 mg total) by mouth    HYDRAZINE SULFATE, BULK, N/A    1 Tablet Twice daily    isosorbide mononitrate (  IMDUR) 60 mg Oral Tablet Sustained Release 24 hr    TAKE 1 TABLET BY MOUTH ONCE DAILY    latanoprost (XALATAN) 0.005 % Ophthalmic Drops    Instill 1 Drop into both eyes Every evening    levothyroxine (SYNTHROID) 125 mcg Oral Tablet    Take 1 Tablet (125 mcg total) by mouth Once a day    losartan (COZAAR) 100 mg Oral Tablet    Take 1 Tablet (100 mg total) by mouth Once a day for 30 days    metoprolol succinate (TOPROL-XL) 25 mg Oral Tablet Sustained Release 24 hr    Take 1 Tablet (25 mg total) by mouth Once a day    Multivitamins with Fluoride (MULTI-VITAMIN PO)    Take by mouth    simvastatin (ZOCOR) 40 mg Oral Tablet    Take 1 Tablet (40 mg total) by mouth Once a day    spironolactone (ALDACTONE) 25 mg Oral Tablet    Take 1 Tablet (25 mg total) by mouth Every morning with breakfast for 30 days    vit A/vit C/vit E/zinc/copper (PRESERVISION AREDS ORAL)    Take 1 Tablet by mouth Twice daily            Allergies: No Known Allergies    Above history reviewed with patient.  Allergies, medication list, reviewed.        Physical exam:  Body mass index is 29.95 kg/m.  ED Triage Vitals [10/21/23 0535]   BP (Non-Invasive) (!) 194/63   Heart Rate 64   Respiratory Rate (!) 25   Temperature  36.1 C (97 F)   SpO2 99 %   Weight 81.6 kg (180 lb)   Height 1.651 m (5\' 5" )     Constitutional: patient is not following commands.  Seems to be having pain.  Will frequently reposition self in bed with panting respiration rate at cease upon stopped moving.  Appears to be due to discomfort.  HENT:   Head: Normocephalic and atraumatic.   Right Ear: External ear normal.   Left Ear: External ear normal.   Nose: Nose normal.   Mouth/Throat: Oropharynx is clear and moist.   Eyes: Pupils are equal, round, and reactive to light. Conjunctivae and EOM are normal.   Neck: Normal range of motion. Neck supple.   Cardiovascular: Normal rate, regular rhythm, harsh systolic murmur heard at left upper sternal border radiating into neck.  and intact distal pulses.   Pulmonary/Chest: Effort normal and breath sounds normal.   Abdominal: Soft. Bowel sounds are normal.   Musculoskeletal: Normal range of motion.  Unable to elicit any pain on palpation of back.  Neurological:  Patient is confused.  GCS score is 15.   Skin: Skin is warm and dry.   Psychiatric:  Memory appears to be markedly impaired at this time.  The following orders were placed after examining the patient :  Orders Placed This Encounter    XR AP MOBILE CHEST    CT LUMBAR SPINE WO IV CONTRAST    CT THORACIC SPINE WO IV CONTRAST    CT BRAIN WO IV CONTRAST    CBC/DIFF    COMPREHENSIVE METABOLIC PANEL, NON-FASTING    URINALYSIS WITH REFLEX MICROSCOPIC AND CULTURE IF POSITIVE    TROPONIN-I    COVID - 19 SCREENING - Symptomatic - PUI    CBC WITH DIFF    URINALYSIS, MACRO/MICRO    EXTRA TUBES    BLUE TOP TUBE    GOLD TOP TUBE  GRAY TOP TUBE    BNP, LAB ISTAT    URINALYSIS, MICROSCOPIC    ECG 12-LEAD    NS premix infusion      XR AP MOBILE CHEST    (Results Pending)   CT LUMBAR SPINE WO IV CONTRAST    (Results Pending)   CT THORACIC SPINE WO IV CONTRAST    (Results Pending)   CT BRAIN WO IV CONTRAST    (Results Pending)      Results for orders placed or performed during the  hospital encounter of 10/21/23 (from the past 12 hour(s))   COMPREHENSIVE METABOLIC PANEL, NON-FASTING   Result Value Ref Range    SODIUM 142 136 - 145 mmol/L    POTASSIUM 4.2 3.5 - 5.1 mmol/L    CHLORIDE 111 96 - 111 mmol/L    CO2 TOTAL 20 (L) 23 - 31 mmol/L    ANION GAP 11 4 - 13 mmol/L    BUN 67 (H) 8 - 25 mg/dL    CREATININE 6.94 (H) 0.60 - 1.05 mg/dL    BUN/CREA RATIO 23 (H) 6 - 22    ALBUMIN 2.9 (L) 3.4 - 4.8 g/dL     CALCIUM 85.4 (H) 8.6 - 10.3 mg/dL    GLUCOSE 627 65 - 035 mg/dL    ALKALINE PHOSPHATASE 56 55 - 145 U/L    ALT (SGPT) 17 <31 U/L    AST (SGOT)  37 (H) 11 - 34 U/L    BILIRUBIN TOTAL 1.1 0.3 - 1.3 mg/dL    PROTEIN TOTAL 7.1 6.0 - 8.0 g/dL    ESTIMATED GFR - FEMALE 14 (L) >=60 mL/min/BSA   TROPONIN-I   Result Value Ref Range    TROPONIN-I HS 123.8 (HH) <=14.0 ng/L ng/L   CBC WITH DIFF   Result Value Ref Range    WBC 9.4 3.7 - 11.0 x10^3/uL    RBC 4.31 3.85 - 5.22 x10^6/uL    HGB 12.7 11.5 - 16.0 g/dL    HCT 00.9 38.1 - 82.9 %    MCV 88.6 78.0 - 100.0 fL    MCH 29.5 26.0 - 32.0 pg    MCHC 33.2 31.0 - 35.5 g/dL    RDW-CV 93.7 16.9 - 67.8 %    PLATELETS 262 150 - 400 x10^3/uL    MPV 9.2 8.7 - 12.5 fL    NEUTROPHIL % 63.8 %    LYMPHOCYTE % 20.3 %    MONOCYTE % 11.7 %    EOSINOPHIL % 3.2 %    BASOPHIL % 0.7 %    NEUTROPHIL # 5.98 1.50 - 7.70 x10^3/uL    LYMPHOCYTE # 1.90 1.00 - 4.80 x10^3/uL    MONOCYTE # 1.10 0.20 - 1.10 x10^3/uL    EOSINOPHIL # 0.30 <=0.50 x10^3/uL    BASOPHIL # <0.10 <=0.20 x10^3/uL    IMMATURE GRANULOCYTE % 0.3 0.0 - 1.0 %    IMMATURE GRANULOCYTE # <0.10 <0.10 x10^3/uL   BNP, LAB ISTAT   Result Value Ref Range    BNP, POC 1,464 (H) <=100   COVID - 19 SCREENING - Symptomatic - PUI   Result Value Ref Range    SARS-CoV-2 Not Detected Not Detected    INFLUENZA VIRUS TYPE A Not Detected Not Detected    INFLUENZA VIRUS TYPE B Not Detected Not Detected    RESPIRATORY SYNCTIAL VIRUS (RSV) Not Detected Not Detected   URINALYSIS, MACRO/MICRO   Result Value Ref Range    SPECIFIC GRAVITY 1.010  1.005 - 1.030  GLUCOSE Negative Negative mg/dL    PROTEIN Negative Negative mg/dL    BILIRUBIN Negative Negative mg/dL    UROBILINOGEN 0.2 0.2 mg/dL    PH 7.0 5.0 - 8.5    BLOOD Trace (A) Negative mg/dL    KETONES Negative Negative mg/dL    NITRITE Negative Negative    LEUKOCYTES Negative Negative WBCs/uL    APPEARANCE Clear Clear, Slightly Hazy    COLOR Yellow Yellow, Straw   URINALYSIS, MICROSCOPIC   Result Value Ref Range    RBCS 3-5 None, Occasional, 0-2, 3-5 /hpf    WBCS 0-2 None, Occasional, 0-2, 3-5 /hpf    BACTERIA None None /hpf    SQUAMOUS EPITHELIAL 3-5 None, Occasional, 0-2, 3-5 /hpf    RENAL EPITHELIAL 3-5 None, Occasional, 0-2, 3-5 /hpf    AMORPHOUS SEDIMENT Rare (A) None /hpf         ED Course:   ED Course as of 10/21/23 0702   Wed Oct 21, 2023   0616 CBC/DIFF  CBC is unremarkable   0618 ECG 12-LEAD  Sinus rhythm with premature supraventricular complexes  Left axis deviation  Incomplete left bundle branch block  Minimal voltage criteria for LVH, may be normal variant ( [employer] product )  Nonspecific ST abnormality  Abnormal ECG  When compared with ECG of  August 11, 2023  Sinus rhythm has replaced Atrial fibrillation     0646 BNP, LAB ISTAT(!)  Mildly elevated BNP, improved from 7 months ago   0646 COMPREHENSIVE METABOLIC PANEL, NON-FASTING(!)  Significantly elevated BUN creatinine.  On 08/12/2023 BUN was 16 creatinine was 1.38.   0646 URINALYSIS WITH REFLEX MICROSCOPIC AND CULTURE IF POSITIVE(!)  Urinalysis is unremarkable   0648 TROPONIN-I(!!)  Elevated troponin of 123.8.  Patient with chronically elevated troponins but this is above her baseline.  Will trend.  May be due to renal failure.      Medications Administered in the ED   NS premix infusion (has no administration in time range)                    Medical Decision Making  Problems Addressed:  Acute kidney injury (CMS HCC): acute illness or injury that poses a threat to life or bodily functions     Details: IV hydration started  Elevated  troponin I level: acute illness or injury that poses a threat to life or bodily functions    Amount and/or Complexity of Data Reviewed  Labs: ordered. Decision-making details documented in ED Course.  Radiology: ordered and independent interpretation performed.  ECG/medicine tests: ordered and independent interpretation performed. Decision-making details documented in ED Course.    Risk  Prescription drug management.         Findings and diagnosis discussed with patient.  Clinical Impression:   Clinical Impression   Dyspnea, unspecified type (Primary)   Acute kidney injury (CMS HCC)   Elevated troponin I level         Disposition:  Care transferred to Dr. Marcha Dutton at 7:00 a.m.          No follow-ups on file.   New Prescriptions    No medications on file      No follow-up provider specified.    Future Appointments  No future appointments.     BP (!) 179/78   Pulse 65   Temp 36.1 C (97 F)   Resp (!) 23   Ht 1.651 m (5\' 5" )   Wt 81.6 kg (180 lb)   SpO2 96%  BMI 29.95 kg/m        [name], MD12/25/2024              This note was partially created using voice recognition software and is inherently subject to errors including those of syntax and "sound alike " substitutions which may escape proof reading.  In such instances, original meaning may be extrapolated by contextual derivation.

## 2023-10-22 ENCOUNTER — Inpatient Hospital Stay (HOSPITAL_COMMUNITY): Payer: MEDICARE

## 2023-10-22 LAB — CBC WITH DIFF
BASOPHIL #: <0.1 10*3/uL (ref <=0.20)
BASOPHIL %: 0.5 %
EOSINOPHIL #: 0.29 10*3/uL (ref <=0.50)
EOSINOPHIL %: 2.2 %
HCT: 36.5 % (ref 34.8–46.0)
HGB: 11.8 g/dL (ref 11.5–16.0)
IMMATURE GRANULOCYTE #: <0.1 10*3/uL (ref <0.10)
IMMATURE GRANULOCYTE %: 0.3 % (ref 0.0–1.0)
LYMPHOCYTE #: 1.68 10*3/uL (ref 1.00–4.80)
LYMPHOCYTE %: 12.8 %
MCH: 29.4 pg (ref 26.0–32.0)
MCHC: 32.3 g/dL (ref 31.0–35.5)
MCV: 91 fL (ref 78.0–100.0)
MONOCYTE #: 1.64 10*3/uL — ABNORMAL HIGH (ref 0.20–1.10)
MONOCYTE %: 12.5 %
MPV: 9.4 fL (ref 8.7–12.5)
NEUTROPHIL #: 9.43 10*3/uL — ABNORMAL HIGH (ref 1.50–7.70)
NEUTROPHIL %: 71.7 %
PLATELETS: 275 10*3/uL (ref 150–400)
RBC: 4.01 10*6/uL (ref 3.85–5.22)
RDW-CV: 15.1 % (ref 11.5–15.5)
WBC: 13.1 10*3/uL — ABNORMAL HIGH (ref 3.7–11.0)

## 2023-10-22 LAB — COMPREHENSIVE METABOLIC PANEL, NON-FASTING
ALBUMIN: 2.4 g/dL — ABNORMAL LOW (ref 3.4–4.8)
ALKALINE PHOSPHATASE: 53 U/L — ABNORMAL LOW (ref 55–145)
ALT (SGPT): 16 U/L (ref <31)
ANION GAP: 10 mmol/L (ref 4–13)
AST (SGOT): 30 U/L (ref 11–34)
BILIRUBIN TOTAL: 1.4 mg/dL — ABNORMAL HIGH (ref 0.3–1.3)
BUN/CREA RATIO: 25 — ABNORMAL HIGH (ref 6–22)
BUN: 63 mg/dL — ABNORMAL HIGH (ref 8–25)
CALCIUM: 9.1 mg/dL (ref 8.6–10.3)
CHLORIDE: 118 mmol/L — ABNORMAL HIGH (ref 96–111)
CO2 TOTAL: 18 mmol/L — ABNORMAL LOW (ref 23–31)
CREATININE: 2.49 mg/dL — ABNORMAL HIGH (ref 0.60–1.05)
ESTIMATED GFR - FEMALE: 18 mL/min/BSA — ABNORMAL LOW (ref >=60)
GLUCOSE: 93 mg/dL (ref 65–125)
POTASSIUM: 3.8 mmol/L (ref 3.5–5.1)
PROTEIN TOTAL: 5.9 g/dL — ABNORMAL LOW (ref 6.0–8.0)
SODIUM: 146 mmol/L — ABNORMAL HIGH (ref 136–145)

## 2023-10-22 LAB — BASIC METABOLIC PANEL
ANION GAP: 9 mmol/L (ref 4–13)
BUN/CREA RATIO: 27 — ABNORMAL HIGH (ref 6–22)
BUN: 55 mg/dL — ABNORMAL HIGH (ref 8–25)
CALCIUM: 9.1 mg/dL (ref 8.6–10.3)
CHLORIDE: 123 mmol/L — ABNORMAL HIGH (ref 96–111)
CO2 TOTAL: 17 mmol/L — ABNORMAL LOW (ref 23–31)
CREATININE: 2.07 mg/dL — ABNORMAL HIGH (ref 0.60–1.05)
ESTIMATED GFR - FEMALE: 22 mL/min/BSA — ABNORMAL LOW (ref >=60)
GLUCOSE: 98 mg/dL (ref 65–125)
POTASSIUM: 3.9 mmol/L (ref 3.5–5.1)
SODIUM: 149 mmol/L — ABNORMAL HIGH (ref 136–145)

## 2023-10-22 MED ORDER — HYDRALAZINE 20 MG/ML INJECTION SOLUTION
10.0000 mg | Freq: Four times a day (QID) | INTRAMUSCULAR | Status: DC | PRN
Start: 2023-10-22 — End: 2023-10-26
  Administered 2023-10-23: 10 mg via INTRAVENOUS
  Filled 2023-10-22: qty 1

## 2023-10-22 MED ORDER — ACETAMINOPHEN 1,000 MG/100 ML (10 MG/ML) INTRAVENOUS SOLUTION
1000.0000 mg | Freq: Once | INTRAVENOUS | Status: AC
Start: 2023-10-22 — End: 2023-10-22
  Administered 2023-10-22: 1000 mg via INTRAVENOUS
  Administered 2023-10-22: 0 mg via INTRAVENOUS
  Filled 2023-10-22: qty 100

## 2023-10-22 MED ORDER — FUROSEMIDE 10 MG/ML INJECTION SOLUTION
80.0000 mg | Freq: Once | INTRAMUSCULAR | Status: AC
Start: 2023-10-22 — End: 2023-10-22
  Administered 2023-10-22: 80 mg via INTRAVENOUS
  Filled 2023-10-22: qty 8

## 2023-10-22 MED ORDER — SODIUM CHLORIDE 0.9 % IV BOLUS
500.0000 mL | INJECTION | Freq: Once | Status: AC
Start: 2023-10-22 — End: 2023-10-22
  Administered 2023-10-22: 500 mL via INTRAVENOUS
  Administered 2023-10-22: 0 mL via INTRAVENOUS

## 2023-10-22 MED ORDER — ELECTROLYTE-A INTRAVENOUS SOLUTION
INTRAVENOUS | Status: DC
Start: 2023-10-22 — End: 2023-10-23
  Administered 2023-10-23: 0 mL via INTRAVENOUS

## 2023-10-22 NOTE — Care Plan (Signed)
Problem: Acute Kidney Injury/Impairment  Goal: Fluid and Electrolyte Balance  Outcome: Ongoing (see interventions/notes)     Problem: Adult Inpatient Plan of Care  Goal: Optimal Comfort and Wellbeing  Outcome: Ongoing (see interventions/notes)  Intervention: Provide Person-Centered Care  Recent Flowsheet Documentation  Taken 10/21/2023 2055 by Jonelle Sidle, LPN  Trust Relationship/Rapport:   care explained   choices provided   questions encouraged   Patient has been restless all night. Tylenol IV helped for a little while, but patient continues to be restless and moaning. NS infusing @ 110ml/hr. NS 500mg  bolus administered.

## 2023-10-22 NOTE — Care Management Notes (Signed)
[facility]  Care Management Initial Evaluation    Patient Name: [name]  Date of Birth: 06-25-1929  Sex: female  Date/Time of Admission: 10/21/2023  5:32 AM  Room/Bed: 1136/B  Payor: [employer]ICARE / Plan: [employer] MEDICARE / Product Type: Medicare /   Primary Care Providers:  Marcelino Freestone, FNP, FNP (General)    Pharmacy Info:   Preferred Pharmacy       [facility] [phone] - [place], [place] - [address] AT [facility] OF [address] [place] [address]    [phone] [address] [place] [address]    Phone: [phone] Fax: [phone]    Hours: Not open 24 hours          Emergency Contact Info:   Extended Emergency Contact Information  Primary Emergency Contact: [name],[name]  Address: [address]   [place]  Mobile Phone: [phone]  Relation: Daughter    History:   [name] is a 89 y.o., female, admitted 10/21/2023    Height/Weight: 165.1 cm (5\' 5" ) / 77.7 kg (171 lb 5 oz)     LOS: 1 day   Admitting Diagnosis: AKI (acute kidney injury) (CMS HCC) [N17.9]    Assessment: Bedside meeting with patient to complete Admission Assessment.  Patient unable to answer questions at this time d/t acute medical condition/cognitive status - all questions were answered by her daughter [name] who was at bedside.  She reports patient's condition being a stark difference from her baseline of being alert and oriented, fully independent.  Patient has established [facility] services with [facility].  Will continue to evaluate for developing discharge needs.     10/22/23 1220   Assessment Details   Assessment Type Admission   Readmission   Is this a readmission? No   Insurance Information/Type   Insurance type Medicare   Medicare Intent to Discharge Documentation   Admit IMM given to: Patient Representative   Admit IMM letter given date 10/20/23   Admit IMM letter time given 1550   Patient Representative [name]   Admit IMM Pt Representative/Phone #  [phone]   IMM explained/reviewed with:  Patient Representative;verbalized understanding   Employment/Financial   Patient has Prescription Coverage?  Yes        Name of Insurance Coverage for Medications [employer] and [facility] Medicare   Financial/Environmental Concerns none   Living Environment   Select an age group to open "lives with" row.  Adult   Lives With child(ren), adult   Living Arrangements house   Able to Return to Prior Arrangements other (see comments)  (Undetermined at this time)   Living Arrangement Comments Patient lives with her daughter [name] [facility]   Home Safety   Home Assessment: No Problems Identified   Home Accessibility no concerns;bed and bath on same level;stairs to enter home;grab bars present (bathtub)   Custody and Legal Status   Do you have a court appointed guardian/conservator? No   Care Management Plan   Discharge Planning Status initial meeting   Projected Discharge Date 10/24/23   Discharge plan discussed with: Other (Comment)  (Daughter [name])   Discharge Needs Assessment   Outpatient/Agency/Support Group Needs homecare agency  ([facility] for [facility])   Equipment Currently Used at Home walker, front wheeled;wheelchair   Equipment Needed After Discharge other (see comments)  (undetermined at this time)   Discharge Facility/Level of Care Needs Undetermined at this time   Transportation Available family or friend will provide;car;ambulance   Referral  Information   Admission Type inpatient   Address Verified verified-no changes   Arrived From home or self-care   ADVANCE DIRECTIVES   Does the Patient have an Advance Directive? Yes, Patient Does Have Advance Directive for Healthcare Treatment   Document the Substance of the Advance Directive (Required) MPOA   Type of Advance Directive Completed Medical Power of Attorney   Copy of Advance Directives in Chart? No, Copy Requested From Family   Name of MPOA or Healthcare Surrogate [name]   Phone Number of MPOA or  Healthcare Surrogate [phone]   Patient Requests Assistance in Having Advance Directive Notarized. N/A   Mutuality/Individual Preferences    Patient-Specific Goals (Include Timeframe) Unable to determined d/t patient's current cognitive status   Anxieties, Fears or Concerns Unable to determined d/t patient's current cognitive status   Home Main Entrance   Number of Stairs, Main Entrance one         Discharge Plan:  Undetermined at this time  Patient is established with [facility]    The patient will continue to be evaluated for developing discharge needs.     Case Manager: Randol Kern, RN  Phone: [phone]

## 2023-10-22 NOTE — Nurses Notes (Signed)
Patient restless and moaning. Attempted to give patient a drink of water, but unable to follow commands. BP 90/51. Heart rate 90's-100. Ree Edman PA notified. New orders noted for NS bolus now. Tylenol 1000mg  IV now.

## 2023-10-22 NOTE — Progress Notes (Signed)
[facility]  Hospitalist  Progress Note    Date of Service:  10/22/2023  [name], [name], 89 y.o. female  Date of Admission:  10/21/2023  Date of Birth:  Apr 18, 1929  PCP: Marcelino Freestone, FNP    HPI:  Patient resting in bed with eyes closed.  Daughter is at bedside.  Patient is having episodes of hyperventilation.  Laboratory studies suggest uremia as her cause for altered mental status.  Continue IV fluids.  Continue to monitor renal function.  Patient did have outlet obstruction and catheter was placed.  Renal function has improved compared to yesterday.  White blood cell count is 13.1 today.  Urinalysis shows no infection.  Troponin was trending down and has not been followed.    acetaminophen (TYLENOL) tablet, 650 mg, Oral, Q4H PRN  aluminum-magnesium hydroxide-simethicone (MAG-AL PLUS) 200-200-20 mg per 5 mL oral liquid, 20 mL, Oral, Q4H PRN  apixaban (ELIQUIS) tablet, 2.5 mg, Oral, 2x/day  aspirin (ECOTRIN) enteric coated tablet 81 mg, 81 mg, Oral, Daily  atorvastatin (LIPITOR) tablet, 20 mg, Oral, QPM  isosorbide mononitrate (IMDUR) 24 hr extended release tablet, 60 mg, Oral, Daily  levothyroxine (SYNTHROID) tablet, 125 mcg, Oral, Daily  magnesium hydroxide (MILK OF MAGNESIA) 400mg  per 5mL oral liquid, 15 mL, Oral, Daily PRN  metoprolol succinate (TOPROL-XL) 24 hr extended release tablet, 25 mg, Oral, Daily  NS flush syringe, 10 mL, Intracatheter, Q8HRS  NS flush syringe, 10 mL, Intracatheter, Q1H PRN  NS premix infusion, , Intravenous, Continuous  ondansetron (ZOFRAN) 2 mg/mL injection, 4 mg, Intravenous, Q6H PRN         No Known Allergies          Filed Vitals:    10/22/23 0725 10/22/23 0810 10/22/23 1139 10/22/23 1611   BP: (!) 107/46  121/61 (!) 104/57   Pulse: 72  78 73   Resp: 16  16    Temp: 37.1 C (98.7 F)  36.9 C (98.4 F) 36.6 C (97.8 F)   SpO2:  97%         Physical Exam  Vitals and nursing note reviewed.   Constitutional:       General: She is sleeping. She is not in  acute distress.     Appearance: Normal appearance. She is ill-appearing. She is not diaphoretic.   HENT:      Head: Normocephalic and atraumatic.      Right Ear: External ear normal.      Left Ear: External ear normal.      Nose: Nose normal.      Mouth/Throat:      Mouth: Mucous membranes are moist.   Eyes:      General: No scleral icterus.     Conjunctiva/sclera: Conjunctivae normal.      Pupils: Pupils are equal, round, and reactive to light.   Cardiovascular:      Rate and Rhythm: Normal rate and regular rhythm.      Heart sounds: Normal heart sounds. No murmur heard.     No friction rub. No gallop.   Pulmonary:      Effort: Pulmonary effort is normal. No respiratory distress.      Breath sounds: Normal breath sounds. No wheezing or rales.   Chest:      Chest wall: No tenderness.   Abdominal:      General: Bowel sounds are normal. There is no distension.      Palpations: Abdomen is soft. There is no mass.  Tenderness: There is no abdominal tenderness. There is no guarding or rebound.   Musculoskeletal:         General: Normal range of motion.      Cervical back: Normal range of motion and neck supple.   Skin:     General: Skin is warm and dry.      Findings: No erythema or rash.   Neurological:      Mental Status: She is oriented to person, place, and time. She is lethargic.      Cranial Nerves: No cranial nerve deficit.   Psychiatric:         Mood and Affect: Mood and affect normal.         Behavior: Behavior is uncooperative, agitated and withdrawn.         Thought Content: Thought content normal.      Comments: Patient resting in bed with eyes closed.  She does not have meaningful conversation.  She is moaning in her sleep with hyperventilation visualized          Laboratory Data:     Results for orders placed or performed during the hospital encounter of 10/21/23 (from the past 24 hour(s))   URINALYSIS, MACRO/MICRO   Result Value Ref Range    SPECIFIC GRAVITY 1.010 1.005 - 1.030    GLUCOSE Negative  Negative mg/dL    PROTEIN 161 (A) Negative mg/dL    BILIRUBIN Negative Negative mg/dL    UROBILINOGEN 0.2 0.2 mg/dL    PH 6.5 5.0 - 8.5    BLOOD Large (A) Negative mg/dL    KETONES Negative Negative mg/dL    NITRITE Negative Negative    LEUKOCYTES Negative Negative WBCs/uL    APPEARANCE Slightly Cloudy (A) Clear, Slightly Hazy    COLOR Red (A) Yellow, Straw   URINALYSIS, MICROSCOPIC   Result Value Ref Range    RBCS 20-50 (A) None, Occasional, 0-2, 3-5 /hpf    WBCS 0-2 None, Occasional, 0-2, 3-5 /hpf    BACTERIA Rare (A) None /hpf    SQUAMOUS EPITHELIAL 6-10 (A) None, Occasional, 0-2, 3-5 /hpf   COMPREHENSIVE METABOLIC PANEL, NON-FASTING   Result Value Ref Range    SODIUM 146 (H) 136 - 145 mmol/L    POTASSIUM 3.8 3.5 - 5.1 mmol/L    CHLORIDE 118 (H) 96 - 111 mmol/L    CO2 TOTAL 18 (L) 23 - 31 mmol/L    ANION GAP 10 4 - 13 mmol/L    BUN 63 (H) 8 - 25 mg/dL    CREATININE 0.96 (H) 0.60 - 1.05 mg/dL    BUN/CREA RATIO 25 (H) 6 - 22    ALBUMIN 2.4 (L) 3.4 - 4.8 g/dL     CALCIUM 9.1 8.6 - 04.5 mg/dL    GLUCOSE 93 65 - 409 mg/dL    ALKALINE PHOSPHATASE 53 (L) 55 - 145 U/L    ALT (SGPT) 16 <31 U/L    AST (SGOT)  30 11 - 34 U/L    BILIRUBIN TOTAL 1.4 (H) 0.3 - 1.3 mg/dL    PROTEIN TOTAL 5.9 (L) 6.0 - 8.0 g/dL    ESTIMATED GFR - FEMALE 18 (L) >=60 mL/min/BSA   CBC WITH DIFF   Result Value Ref Range    WBC 13.1 (H) 3.7 - 11.0 x10^3/uL    RBC 4.01 3.85 - 5.22 x10^6/uL    HGB 11.8 11.5 - 16.0 g/dL    HCT 81.1 91.4 - 78.2 %    MCV 91.0 78.0 - 100.0 fL  MCH 29.4 26.0 - 32.0 pg    MCHC 32.3 31.0 - 35.5 g/dL    RDW-CV 09.8 11.9 - 14.7 %    PLATELETS 275 150 - 400 x10^3/uL    MPV 9.4 8.7 - 12.5 fL    NEUTROPHIL % 71.7 %    LYMPHOCYTE % 12.8 %    MONOCYTE % 12.5 %    EOSINOPHIL % 2.2 %    BASOPHIL % 0.5 %    NEUTROPHIL # 9.43 (H) 1.50 - 7.70 x10^3/uL    LYMPHOCYTE # 1.68 1.00 - 4.80 x10^3/uL    MONOCYTE # 1.64 (H) 0.20 - 1.10 x10^3/uL    EOSINOPHIL # 0.29 <=0.50 x10^3/uL    BASOPHIL # <0.10 <=0.20 x10^3/uL    IMMATURE GRANULOCYTE %  0.3 0.0 - 1.0 %    IMMATURE GRANULOCYTE # <0.10 <0.10 x10^3/uL       Imaging Studies:    CT LUMBAR SPINE WO IV CONTRAST   Final Result by Edi, Radresults In (12/25 0720)      No acute fracture of the lumbar spine.      Multilevel lumbar spondylosis. See description at each specific level above.      Mild scoliosis.          The CT exam was performed using one or more of the following dose reduction techniques: Automated exposure control, adjustment of the mA and/or kV according to the patient's size, or use of iterative reconstruction technique.               Radiologist location ID: [id number]         CT THORACIC SPINE WO IV CONTRAST   Final Result by Edi, Radresults In (12/25 7846)      No acute fracture or subluxation of the thoracic spine.      Moderate scoliosis. Mild multilevel degenerative change.      Mild motion artifact.             The CT exam was performed using one or more of the following dose reduction techniques: Automated exposure control, adjustment of the mA and/or kV according to the patient's size, or use of iterative reconstruction technique.               Radiologist location ID: [id number]         CT BRAIN WO IV CONTRAST   Final Result by Edi, Radresults In (12/25 0707)   No acute intracranial abnormality.               The CT exam was performed using one or more of the following dose reduction techniques: Automated exposure control, adjustment of the mA and/or kV according to the patient's size, or use of iterative reconstruction technique.               Radiologist location ID: [id number]         XR AP MOBILE CHEST   Final Result by Edi, Radresults In (12/25 0706)      No acute cardiopulmonary process. Chronic changes.             Radiologist location ID: [id number]             Assessment/Plan:  Hospital Problems    1AKI (acute kidney injury) (CMS [facility])         Date Noted: 10/21/2023      Dehydration         Date Noted: 10/21/2023      Atrial fibrillation (CMS HCC)  Date  Noted: 02/27/2017      Essential hypertension         Date Noted: 01/16/2017      Hyperlipidemia         Date Noted: 01/16/2017      Hypothyroidism         Date Noted: 01/16/2017      Resolved Hospital Problems  No resolved problems to display.      1. Acute kidney injury.  Improving.  Continue supportive care with IV fluids and telemetry.  Repeat ABG in the morning.  Patient will be changed to Plasmalyte.  2. Dehydration.  Same treatment as 1.   3. Atrial fibrillation.  Continue Eliquis and metoprolol.    4. Hypertension.  Continue metoprolol and Imdur.  Losartan has been held secondary to renal function.    5. Hyperlipidemia.  Chronic condition.  Continue statin therapy.    6. deep vein thrombosis prophylaxis.  Eliquis b.i.d..       I spent a total of (20) minutes in direct/indirect care of this patient including initial evaluation, review of laboratory, radiology, diagnostic studies, review of medical record, order entry and coordination of care.     Ronnald Nian, PA-C      I personally saw and evaluated the patient as part of a shared service with an APP.    My substantive findings are:  SUBSTANTIVE FINDINGS: MDM (complete).  Acute kidney injury.  Improving.  Continue IV fluids.  Repeat lab work in the morning.  Repeat ABG in the morning.     I independently of the APP spent a total of (15) minutes in direct/indirect care of this patient including initial evaluation, review of laboratory, radiology, diagnostic studies, review of medical record, order entry and coordination of care.     Leonarda Salon, MD  10/22/2023, 18:36

## 2023-10-22 NOTE — Care Plan (Signed)
Problem: Adult Inpatient Plan of Care  Goal: Plan of Care Review  Outcome: Ongoing (see interventions/notes)  Flowsheets (Taken 10/22/2023 1848)  Progress: no change  Plan of Care Reviewed With: patient     Lying in bed. Restless, no verbal response. Respirations even and unlabored. Family at bedside majority of the day. Will continue to monitor. Marylynn Pearson, RN

## 2023-10-22 NOTE — Nurses Notes (Signed)
Dr. Olean Ree notified of PT blood pressure of 190/80 Manual. New Orders Received.

## 2023-10-23 ENCOUNTER — Inpatient Hospital Stay (HOSPITAL_COMMUNITY): Payer: MEDICARE

## 2023-10-23 DIAGNOSIS — E870 Hyperosmolality and hypernatremia: Secondary | ICD-10-CM | POA: Insufficient documentation

## 2023-10-23 DIAGNOSIS — N390 Urinary tract infection, site not specified: Secondary | ICD-10-CM | POA: Insufficient documentation

## 2023-10-23 LAB — URINALYSIS, MACRO/MICRO
BILIRUBIN: NEGATIVE mg/dL
GLUCOSE: NEGATIVE mg/dL
KETONES: NEGATIVE mg/dL
NITRITE: NEGATIVE
PH: 5.5 (ref 5.0–8.5)
PROTEIN: 30 mg/dL — AB
SPECIFIC GRAVITY: 1.025 (ref 1.005–1.030)
UROBILINOGEN: 0.2 mg/dL

## 2023-10-23 LAB — CBC WITH DIFF
BASOPHIL #: <0.1 10*3/uL (ref <=0.20)
BASOPHIL %: 0.4 %
EOSINOPHIL #: 0.26 10*3/uL (ref <=0.50)
EOSINOPHIL %: 1.7 %
HCT: 41.4 % (ref 34.8–46.0)
HGB: 13.3 g/dL (ref 11.5–16.0)
IMMATURE GRANULOCYTE #: <0.1 10*3/uL (ref <0.10)
IMMATURE GRANULOCYTE %: 0.4 % (ref 0.0–1.0)
LYMPHOCYTE #: 1.46 10*3/uL (ref 1.00–4.80)
LYMPHOCYTE %: 9.7 %
MCH: 29.9 pg (ref 26.0–32.0)
MCHC: 32.1 g/dL (ref 31.0–35.5)
MCV: 93 fL (ref 78.0–100.0)
MONOCYTE #: 1.44 10*3/uL — ABNORMAL HIGH (ref 0.20–1.10)
MONOCYTE %: 9.6 %
MPV: 9.3 fL (ref 8.7–12.5)
NEUTROPHIL #: 11.7 10*3/uL — ABNORMAL HIGH (ref 1.50–7.70)
NEUTROPHIL %: 78.2 %
PLATELETS: 279 10*3/uL (ref 150–400)
RBC: 4.45 10*6/uL (ref 3.85–5.22)
RDW-CV: 15.4 % (ref 11.5–15.5)
WBC: 15 10*3/uL — ABNORMAL HIGH (ref 3.7–11.0)

## 2023-10-23 LAB — COMPREHENSIVE METABOLIC PANEL, NON-FASTING
ALBUMIN: 2.5 g/dL — ABNORMAL LOW (ref 3.4–4.8)
ALKALINE PHOSPHATASE: 62 U/L (ref 55–145)
ALT (SGPT): 18 U/L (ref <31)
ANION GAP: 10 mmol/L (ref 4–13)
AST (SGOT): 30 U/L (ref 11–34)
BILIRUBIN TOTAL: 1.2 mg/dL (ref 0.3–1.3)
BUN/CREA RATIO: 25 — ABNORMAL HIGH (ref 6–22)
BUN: 50 mg/dL — ABNORMAL HIGH (ref 8–25)
CALCIUM: 9.3 mg/dL (ref 8.6–10.3)
CHLORIDE: 123 mmol/L — ABNORMAL HIGH (ref 96–111)
CO2 TOTAL: 19 mmol/L — ABNORMAL LOW (ref 23–31)
CREATININE: 1.97 mg/dL — ABNORMAL HIGH (ref 0.60–1.05)
ESTIMATED GFR - FEMALE: 23 mL/min/BSA — ABNORMAL LOW (ref >=60)
GLUCOSE: 117 mg/dL (ref 65–125)
POTASSIUM: 3.9 mmol/L (ref 3.5–5.1)
PROTEIN TOTAL: 6.5 g/dL (ref 6.0–8.0)
SODIUM: 152 mmol/L — ABNORMAL HIGH (ref 136–145)

## 2023-10-23 LAB — BASIC METABOLIC PANEL
ANION GAP: 8 mmol/L (ref 4–13)
BUN/CREA RATIO: 26 — ABNORMAL HIGH (ref 6–22)
BUN: 50 mg/dL — ABNORMAL HIGH (ref 8–25)
CALCIUM: 9.2 mg/dL (ref 8.6–10.3)
CHLORIDE: 122 mmol/L — ABNORMAL HIGH (ref 96–111)
CO2 TOTAL: 21 mmol/L — ABNORMAL LOW (ref 23–31)
CREATININE: 1.96 mg/dL — ABNORMAL HIGH (ref 0.60–1.05)
ESTIMATED GFR - FEMALE: 24 mL/min/BSA — ABNORMAL LOW (ref >=60)
GLUCOSE: 111 mg/dL (ref 65–125)
POTASSIUM: 4 mmol/L (ref 3.5–5.1)
SODIUM: 151 mmol/L — ABNORMAL HIGH (ref 136–145)

## 2023-10-23 LAB — URINALYSIS, MICROSCOPIC

## 2023-10-23 LAB — BLOOD GAS
%FIO2 (ARTERIAL): 28 %
BASE DEFICIT: 4.1 mmol/L — ABNORMAL HIGH (ref 0.0–3.0)
BICARBONATE (ARTERIAL): 21.7 mmol/L (ref 21.0–28.0)
O2 SATURATION (ARTERIAL): 96.9 % (ref 94.0–98.0)
PAO2/FIO2 RATIO: 321
PCO2 (ARTERIAL): 33 mm[Hg] — ABNORMAL LOW (ref 35–45)
PH (ARTERIAL): 7.39 (ref 7.35–7.45)
PO2 (ARTERIAL): 90 mm[Hg] (ref 83–108)

## 2023-10-23 LAB — TROPONIN-I: TROPONIN-I HS: 111.9 ng/L (ref <=14.0)

## 2023-10-23 LAB — AMMONIA: AMMONIA: 36 umol/L (ref <=55)

## 2023-10-23 LAB — THYROID STIMULATING HORMONE WITH FREE T4 REFLEX: TSH: 1.222 u[IU]/mL (ref 0.350–4.940)

## 2023-10-23 LAB — MAGNESIUM: MAGNESIUM: 2.6 mg/dL (ref 1.8–2.6)

## 2023-10-23 MED ORDER — SODIUM CHLORIDE 0.45 % INTRAVENOUS SOLUTION
INTRAVENOUS | Status: DC
Start: 2023-10-23 — End: 2023-10-25
  Administered 2023-10-25: 0 mL via INTRAVENOUS

## 2023-10-23 MED ORDER — SODIUM CHLORIDE 0.9 % INTRAVENOUS PIGGYBACK
2.0000 g | INTRAVENOUS | Status: DC
Start: 2023-10-23 — End: 2023-10-26
  Administered 2023-10-23: 0 g via INTRAVENOUS
  Administered 2023-10-23 – 2023-10-24 (×2): 2 g via INTRAVENOUS
  Administered 2023-10-24: 0 g via INTRAVENOUS
  Administered 2023-10-25: 2 g via INTRAVENOUS
  Administered 2023-10-25 – 2023-10-26 (×2): 0 g via INTRAVENOUS
  Administered 2023-10-26: 2 g via INTRAVENOUS
  Filled 2023-10-23 (×4): qty 20

## 2023-10-23 MED ORDER — ENOXAPARIN 80 MG/0.8 ML SUBCUTANEOUS SYRINGE
1.0000 mg/kg | INJECTION | SUBCUTANEOUS | Status: DC
Start: 2023-10-23 — End: 2023-10-26
  Administered 2023-10-23 – 2023-10-25 (×3): 80 mg via SUBCUTANEOUS
  Filled 2023-10-23 (×3): qty 0.8

## 2023-10-23 MED ORDER — FENTANYL (PF) 50 MCG/ML INJECTION WRAPPER
25.0000 ug | INJECTION | INTRAMUSCULAR | Status: DC | PRN
Start: 2023-10-23 — End: 2023-10-26
  Administered 2023-10-23: 25 ug via INTRAVENOUS
  Filled 2023-10-23: qty 1

## 2023-10-23 MED ORDER — FUROSEMIDE 10 MG/ML INJECTION SOLUTION
40.0000 mg | Freq: Once | INTRAMUSCULAR | Status: AC
Start: 2023-10-23 — End: 2023-10-23
  Administered 2023-10-23: 40 mg via INTRAVENOUS
  Filled 2023-10-23: qty 4

## 2023-10-23 NOTE — Nurses Notes (Signed)
Critical troponin 111.9 reported to Dr. Anne Hahn. No new orders right now.

## 2023-10-23 NOTE — Nurses Notes (Signed)
Daughter at bedside at all times and is happy to report patient is now making eye contact with her and recognizes who she is. I spoke to patient and she said "hi" while making eye contact with me. She is nodding yes and no to questions. She is groaning frequently though shakes her head "no" when asked about pain. Occasional labored breathing. O2 2L NC in place and sat mid 90's. Dr. Anne Hahn at bedside for assessment.

## 2023-10-23 NOTE — Care Plan (Signed)
Problem: Health Knowledge, Opportunity to Enhance (Adult,Obstetrics,Pediatric)  Goal: Knowledgeable about Health Subject/Topic  Description: Patient will demonstrate the desired outcomes by discharge/transition of care.  Outcome: Ongoing (see interventions/notes)  Intervention: Enhance Health Knowledge  Recent Flowsheet Documentation  Taken 10/22/2023 2300 by Jamesetta Orleans, RN  Family/Support System Care: caregiver stress acknowledged     Problem: Acute Kidney Injury/Impairment  Goal: Fluid and Electrolyte Balance  Outcome: Ongoing (see interventions/notes)  Goal: Improved Oral Intake  Outcome: Ongoing (see interventions/notes)  Goal: Effective Renal Function  Outcome: Ongoing (see interventions/notes)     Problem: Cardiac Output Decreased  Goal: Effective Cardiac Output  Outcome: Ongoing (see interventions/notes)  Intervention: Optimize Cardiac Output  Recent Flowsheet Documentation  Taken 10/22/2023 2300 by Jamesetta Orleans, RN  VTE Prevention/Management: (Unable to swallow.) patient refused intervention     Problem: Heart Failure  Goal: Optimal Coping  Outcome: Ongoing (see interventions/notes)  Intervention: Support Psychosocial Response  Recent Flowsheet Documentation  Taken 10/22/2023 2300 by Jamesetta Orleans, RN  Family/Support System Care: caregiver stress acknowledged  Goal: Optimal Cardiac Output and Blood Flow  Outcome: Ongoing (see interventions/notes)  Goal: Stable Heart Rate and Rhythm  Outcome: Ongoing (see interventions/notes)  Goal: Fluid and Electrolyte Balance  Outcome: Ongoing (see interventions/notes)  Goal: Optimal Functional Ability  Outcome: Ongoing (see interventions/notes)  Goal: Improved Oral Intake  Outcome: Ongoing (see interventions/notes)  Goal: Effective Oxygenation and Ventilation  Outcome: Ongoing (see interventions/notes)  Goal: Effective Breathing Pattern During Sleep  Outcome: Ongoing (see interventions/notes)     Problem: Adult Inpatient Plan of Care  Goal: Plan of Care  Review  Outcome: Ongoing (see interventions/notes)  Goal: Patient-Specific Goal (Individualized)  Outcome: Ongoing (see interventions/notes)  Goal: Absence of Hospital-Acquired Illness or Injury  Outcome: Ongoing (see interventions/notes)  Intervention: Identify and Manage Fall Risk  Recent Flowsheet Documentation  Taken 10/22/2023 2300 by Jamesetta Orleans, RN  Safety Promotion/Fall Prevention:   activity supervised   fall prevention program maintained   muscle strengthening facilitated   safety round/check completed  Intervention: Prevent and Manage VTE (Venous Thromboembolism) Risk  Recent Flowsheet Documentation  Taken 10/22/2023 2300 by Jamesetta Orleans, RN  VTE Prevention/Management: (Unable to swallow.) patient refused intervention  Goal: Optimal Comfort and Wellbeing  Outcome: Ongoing (see interventions/notes)  Intervention: Provide Person-Centered Care  Recent Flowsheet Documentation  Taken 10/22/2023 2300 by Jamesetta Orleans, RN  Trust Relationship/Rapport:   care explained   choices provided   emotional support provided   empathic listening provided   questions answered   questions encouraged  Goal: Rounds/Family Conference  Outcome: Ongoing (see interventions/notes)     Problem: Fall Injury Risk  Goal: Absence of Fall and Fall-Related Injury  Outcome: Ongoing (see interventions/notes)  Intervention: Promote Injury-Free Environment  Recent Flowsheet Documentation  Taken 10/22/2023 2300 by Jamesetta Orleans, RN  Safety Promotion/Fall Prevention:   activity supervised   fall prevention program maintained   muscle strengthening facilitated   safety round/check completed     Problem: Skin Injury Risk Increased  Goal: Skin Health and Integrity  Outcome: Ongoing (see interventions/notes)  Intervention: Optimize Skin Protection  Recent Flowsheet Documentation  Taken 10/22/2023 2300 by Jamesetta Orleans, RN  Pressure Reduction Techniques: Frequent weight shifting encouraged     Problem: Adult Inpatient Plan of Care  Goal: Plan of  Care Review  Outcome: Ongoing (see interventions/notes)  Goal: Patient-Specific Goal (Individualized)  Outcome: Ongoing (see interventions/notes)  Goal: Absence of Hospital-Acquired Illness or Injury  Outcome: Ongoing (see interventions/notes)  Intervention: Identify and Manage Fall Risk  Recent Flowsheet Documentation  Taken 10/22/2023 2300 by Jamesetta Orleans, RN  Safety Promotion/Fall Prevention:   activity supervised   fall prevention program maintained   muscle strengthening facilitated   safety round/check completed  Intervention: Prevent and Manage VTE (Venous Thromboembolism) Risk  Recent Flowsheet Documentation  Taken 10/22/2023 2300 by Jamesetta Orleans, RN  VTE Prevention/Management: (Unable to swallow.) patient refused intervention  Goal: Optimal Comfort and Wellbeing  Outcome: Ongoing (see interventions/notes)  Intervention: Provide Person-Centered Care  Recent Flowsheet Documentation  Taken 10/22/2023 2300 by Jamesetta Orleans, RN  Trust Relationship/Rapport:   care explained   choices provided   emotional support provided   empathic listening provided   questions answered   questions encouraged  Goal: Rounds/Family Conference  Outcome: Ongoing (see interventions/notes)

## 2023-10-23 NOTE — Progress Notes (Signed)
[facility]  Hospitalist  Progress Note    Date of Service:  10/23/2023  [name], [name], 89 y.o. female  Date of Admission:  10/21/2023  Date of Birth:  25-Mar-1929  PCP: Marcelino Freestone, FNP    HPI:  Patient is admitted with altered mental status.  Patient has been very confused at home unable to answer questions.  White blood cell count has gone up a little more today.  She was able to answer some yes and no questions this morning for her daughter but still does not seem to be going back to normal.  She had CT of the brain initially that was unremarkable we are going to get an MRI today.  Also the patient has some improvement in her renal function but still is not back to normal.  Also she was getting saline her sodium has gone up slightly so we have switched this to saline.  Patient also had repeat this morning which is now showing small leukocytes and moderate bacteria.  This was not present previously so we have set up a urine culture and started the patient on Rocephin.    Current Facility-Administered Medications   Medication Dose Route Frequency    1/2 NS premix infusion   Intravenous Continuous    acetaminophen (TYLENOL) tablet  650 mg Oral Q4H PRN    aluminum-magnesium hydroxide-simethicone (MAG-AL PLUS) 200-200-20 mg per 5 mL oral liquid  20 mL Oral Q4H PRN    apixaban (ELIQUIS) tablet  2.5 mg Oral 2x/day    aspirin (ECOTRIN) enteric coated tablet 81 mg  81 mg Oral Daily    atorvastatin (LIPITOR) tablet  20 mg Oral QPM    cefTRIAXone (ROCEPHIN) 2 g in NS 100 mL IVPB minibag  2 g Intravenous Q24H    fentaNYL (SUBLIMAZE) 50 mcg/mL injection  25 mcg Intravenous Q2H PRN    hydrALAZINE (APRESOLINE) injection 10 mg  10 mg Intravenous Q6H PRN    isosorbide mononitrate (IMDUR) 24 hr extended release tablet  60 mg Oral Daily    levothyroxine (SYNTHROID) tablet  125 mcg Oral Daily    magnesium hydroxide (MILK OF MAGNESIA) 400mg  per 5mL oral liquid  15 mL Oral Daily PRN    metoprolol succinate  (TOPROL-XL) 24 hr extended release tablet  25 mg Oral Daily    NS flush syringe  10 mL Intracatheter Q8HRS    NS flush syringe  10 mL Intracatheter Q1H PRN    ondansetron (ZOFRAN) 2 mg/mL injection  4 mg Intravenous Q6H PRN        No Known Allergies          Filed Vitals:    10/22/23 2322 10/23/23 0100 10/23/23 0329 10/23/23 0718   BP: (!) 180/53 (!) 148/60 (!) 126/50 115/62   Pulse: 85  80 72   Resp: 18  18 18    Temp: 36.2 C (97.1 F)  36.5 C (97.7 F) 36.5 C (97.7 F)   SpO2:         O2 delivery: Nasal Cannula      Physical Exam  Constitutional:       Comments: Patient lying in bed with her eyes closed however when speaking into her right ear she will open her eyes and look at me, patient appears to understand but is obviously not able to answer questions fully.  She was able to answer for her daughter that she was not hurting in her abdomen or having chest pain but did say she felt  very short of breath   HENT:      Head: Normocephalic and atraumatic.      Right Ear: External ear normal.      Left Ear: External ear normal.   Eyes:      Conjunctiva/sclera: Conjunctivae normal.      Pupils: Pupils are equal, round, and reactive to light.   Neck:      Thyroid: No thyromegaly.      Vascular: No JVD.   Cardiovascular:      Rate and Rhythm: Normal rate and regular rhythm.      Heart sounds: Normal heart sounds. No murmur heard.     No friction rub. No gallop.   Pulmonary:      Effort: No respiratory distress.      Breath sounds: Wheezing and rhonchi present. No rales.      Comments: Some scattered wheezes and rhonchi, there is some disordered breathing pattern patient has some rapid breathing then will slow down some at times.  ABG this morning was unremarkable  Chest:      Chest wall: No tenderness.   Abdominal:      General: Bowel sounds are normal. There is no distension.      Palpations: Abdomen is soft. There is no mass.      Tenderness: There is no abdominal tenderness. There is no guarding or rebound.       Comments: Abdomen is soft and nontender   Musculoskeletal:         General: No tenderness or deformity. Normal range of motion.      Cervical back: Normal range of motion and neck supple.      Right lower leg: No edema.      Left lower leg: No edema.   Lymphadenopathy:      Cervical: No cervical adenopathy.   Skin:     General: Skin is warm and dry.      Coloration: Skin is not pale.      Findings: No rash.   Neurological:      Mental Status: She is lethargic and confused.      Cranial Nerves: No cranial nerve deficit.      Motor: No abnormal muscle tone.      Coordination: Coordination normal.      Comments: Patient unable to follow commands well enough to fully cooperate with neuro testing            Laboratory Data:     Results for orders placed or performed during the hospital encounter of 10/21/23 (from the past 24 hour(s))   BASIC METABOLIC PANEL - ONCE   Result Value Ref Range    SODIUM 149 (H) 136 - 145 mmol/L    POTASSIUM 3.9 3.5 - 5.1 mmol/L    CHLORIDE 123 (H) 96 - 111 mmol/L    CO2 TOTAL 17 (L) 23 - 31 mmol/L    ANION GAP 9 4 - 13 mmol/L    CALCIUM 9.1 8.6 - 10.3 mg/dL    GLUCOSE 98 65 - 409 mg/dL    BUN 55 (H) 8 - 25 mg/dL    CREATININE 8.11 (H) 0.60 - 1.05 mg/dL    BUN/CREA RATIO 27 (H) 6 - 22    ESTIMATED GFR - FEMALE 22 (L) >=60 mL/min/BSA   BLOOD GAS Arterial   Result Value Ref Range    %FIO2 (ARTERIAL) 28 %    PH (ARTERIAL) 7.39 7.35 - 7.45    PCO2 (ARTERIAL) 33 (L) 35 -  45 mm/Hg    PO2 (ARTERIAL) 90 83 - 108 mm/Hg    BICARBONATE (ARTERIAL) 21.7 21.0 - 28.0 mmol/L    BASE DEFICIT 4.1 (H) 0.0 - 3.0 mmol/L    PAO2/FIO2 RATIO 321     O2 SATURATION (ARTERIAL) 96.9 94.0 - 98.0 %   COMPREHENSIVE METABOLIC PANEL, NON-FASTING   Result Value Ref Range    SODIUM 152 (H) 136 - 145 mmol/L    POTASSIUM 3.9 3.5 - 5.1 mmol/L    CHLORIDE 123 (H) 96 - 111 mmol/L    CO2 TOTAL 19 (L) 23 - 31 mmol/L    ANION GAP 10 4 - 13 mmol/L    BUN 50 (H) 8 - 25 mg/dL    CREATININE 1.91 (H) 0.60 - 1.05 mg/dL    BUN/CREA RATIO 25  (H) 6 - 22    ALBUMIN 2.5 (L) 3.4 - 4.8 g/dL     CALCIUM 9.3 8.6 - 47.8 mg/dL    GLUCOSE 295 65 - 621 mg/dL    ALKALINE PHOSPHATASE 62 55 - 145 U/L    ALT (SGPT) 18 <31 U/L    AST (SGOT)  30 11 - 34 U/L    BILIRUBIN TOTAL 1.2 0.3 - 1.3 mg/dL    PROTEIN TOTAL 6.5 6.0 - 8.0 g/dL    ESTIMATED GFR - FEMALE 23 (L) >=60 mL/min/BSA   MAGNESIUM   Result Value Ref Range    MAGNESIUM 2.6 1.8 - 2.6 mg/dL   CBC WITH DIFF   Result Value Ref Range    WBC 15.0 (H) 3.7 - 11.0 x10^3/uL    RBC 4.45 3.85 - 5.22 x10^6/uL    HGB 13.3 11.5 - 16.0 g/dL    HCT 30.8 65.7 - 84.6 %    MCV 93.0 78.0 - 100.0 fL    MCH 29.9 26.0 - 32.0 pg    MCHC 32.1 31.0 - 35.5 g/dL    RDW-CV 96.2 95.2 - 84.1 %    PLATELETS 279 150 - 400 x10^3/uL    MPV 9.3 8.7 - 12.5 fL    NEUTROPHIL % 78.2 %    LYMPHOCYTE % 9.7 %    MONOCYTE % 9.6 %    EOSINOPHIL % 1.7 %    BASOPHIL % 0.4 %    NEUTROPHIL # 11.70 (H) 1.50 - 7.70 x10^3/uL    LYMPHOCYTE # 1.46 1.00 - 4.80 x10^3/uL    MONOCYTE # 1.44 (H) 0.20 - 1.10 x10^3/uL    EOSINOPHIL # 0.26 <=0.50 x10^3/uL    BASOPHIL # <0.10 <=0.20 x10^3/uL    IMMATURE GRANULOCYTE % 0.4 0.0 - 1.0 %    IMMATURE GRANULOCYTE # <0.10 <0.10 x10^3/uL   AMMONIA   Result Value Ref Range    AMMONIA 36 <=55 umol/L   TROPONIN-I   Result Value Ref Range    TROPONIN-I HS 111.9 (HH) <=14.0 ng/L ng/L   URINALYSIS, MACRO/MICRO   Result Value Ref Range    SPECIFIC GRAVITY 1.025 1.005 - 1.030    GLUCOSE Negative Negative mg/dL    PROTEIN 30 (A) Negative mg/dL    BILIRUBIN Negative Negative mg/dL    UROBILINOGEN 0.2 0.2 mg/dL    PH 5.5 5.0 - 8.5    BLOOD Large (A) Negative mg/dL    KETONES Negative Negative mg/dL    NITRITE Negative Negative    LEUKOCYTES Small (A) Negative WBCs/uL    APPEARANCE Clear Clear, Slightly Hazy    COLOR Yellow Yellow, Straw   URINALYSIS, MICROSCOPIC   Result Value Ref  Range    RBCS 50-100 (A) None, Occasional, 0-2, 3-5 /hpf    WBCS 20-50 (A) None, Occasional, 0-2, 3-5 /hpf    BACTERIA Moderate (A) None /hpf    SQUAMOUS EPITHELIAL  3-5 None, Occasional, 0-2, 3-5 /hpf    HYALINE CASTS Occasional (A) None /lpf    FINE GRANULAR CASTS Moderate (A) None /lpf       Imaging Studies:    XR AP MOBILE CHEST   Final Result by Edi, Radresults In (12/26 2050)   No acute process.                        Radiologist location ID: [id number]         CT LUMBAR SPINE WO IV CONTRAST   Final Result by Edi, Radresults In (12/25 0720)      No acute fracture of the lumbar spine.      Multilevel lumbar spondylosis. See description at each specific level above.      Mild scoliosis.          The CT exam was performed using one or more of the following dose reduction techniques: Automated exposure control, adjustment of the mA and/or kV according to the patient's size, or use of iterative reconstruction technique.               Radiologist location ID: [id number]         CT THORACIC SPINE WO IV CONTRAST   Final Result by Edi, Radresults In (12/25 5784)      No acute fracture or subluxation of the thoracic spine.      Moderate scoliosis. Mild multilevel degenerative change.      Mild motion artifact.             The CT exam was performed using one or more of the following dose reduction techniques: Automated exposure control, adjustment of the mA and/or kV according to the patient's size, or use of iterative reconstruction technique.               Radiologist location ID: [id number]         CT BRAIN WO IV CONTRAST   Final Result by Edi, Radresults In (12/25 0707)   No acute intracranial abnormality.               The CT exam was performed using one or more of the following dose reduction techniques: Automated exposure control, adjustment of the mA and/or kV according to the patient's size, or use of iterative reconstruction technique.               Radiologist location ID: [id number]         XR AP MOBILE CHEST   Final Result by Edi, Radresults In (12/25 0706)      No acute cardiopulmonary process. Chronic changes.             Radiologist location ID: [id number]              Assessment/Plan:  Hospital Problems    1AKI (acute kidney injury) (CMS [facility])         Date Noted: 10/21/2023      Dehydration         Date Noted: 10/21/2023      Atrial fibrillation (CMS [facility])         Date Noted: 02/27/2017      Essential hypertension         Date Noted: 01/16/2017  Hyperlipidemia         Date Noted: 01/16/2017      Hypothyroidism         Date Noted: 01/16/2017      Resolved Hospital Problems  No resolved problems to display.      Patient has acute kidney injury which is slowly improving.  She has hypernatremia now we have switched to half-normal saline as well as continue to monitor her electrolytes.  The patient had urine rechecked this morning which does show more bacteria so culture has been set up the patient has been started on Rocephin.  Because of her disordered breathing I am still concerned there could be something going on in the brain including potential bleed or small stroke.  MRI has been ordered.  Also because of her tachypnea and disordered breathing I am going to get CT of the patient's chest.    Continue to monitor the patient closely she is a little more alert this morning but still no where near back to her baseline.    Kellie Shropshire, MD    This note was partially created using M[employer] Fluency Direct system (voice recognition software) and is inherently subject to errors including those of syntax and "sound-alike" substitutions which may escape proofreading.  In such instances, original meaning may be extrapolated by contextual derivation.

## 2023-10-24 LAB — CBC WITH DIFF
BASOPHIL #: <0.1 10*3/uL (ref <=0.20)
BASOPHIL %: 0.4 %
EOSINOPHIL #: <0.1 10*3/uL (ref <=0.50)
EOSINOPHIL %: 0.2 %
HCT: 44.4 % (ref 34.8–46.0)
HGB: 13.2 g/dL (ref 11.5–16.0)
IMMATURE GRANULOCYTE #: 0.18 10*3/uL — ABNORMAL HIGH (ref <0.10)
IMMATURE GRANULOCYTE %: 0.9 % (ref 0.0–1.0)
LYMPHOCYTE #: 1.58 10*3/uL (ref 1.00–4.80)
LYMPHOCYTE %: 7.8 %
MCH: 28.9 pg (ref 26.0–32.0)
MCHC: 29.7 g/dL — ABNORMAL LOW (ref 31.0–35.5)
MCV: 97.4 fL (ref 78.0–100.0)
MONOCYTE #: 1.9 10*3/uL — ABNORMAL HIGH (ref 0.20–1.10)
MONOCYTE %: 9.3 %
MPV: 9.4 fL (ref 8.7–12.5)
NEUTROPHIL #: 16.55 10*3/uL — ABNORMAL HIGH (ref 1.50–7.70)
NEUTROPHIL %: 81.4 %
PLATELETS: 258 10*3/uL (ref 150–400)
RBC: 4.56 10*6/uL (ref 3.85–5.22)
RDW-CV: 15.6 % — ABNORMAL HIGH (ref 11.5–15.5)
WBC: 20.3 10*3/uL — ABNORMAL HIGH (ref 3.7–11.0)

## 2023-10-24 LAB — COMPREHENSIVE METABOLIC PNL, FASTING
ALBUMIN: 2.4 g/dL — ABNORMAL LOW (ref 3.4–4.8)
ALKALINE PHOSPHATASE: 64 U/L (ref 55–145)
ALT (SGPT): 16 U/L (ref <31)
ANION GAP: 11 mmol/L (ref 4–13)
AST (SGOT): 25 U/L (ref 11–34)
BILIRUBIN TOTAL: 0.6 mg/dL (ref 0.3–1.3)
BUN/CREA RATIO: 24 — ABNORMAL HIGH (ref 6–22)
BUN: 51 mg/dL — ABNORMAL HIGH (ref 8–25)
CALCIUM: 9.3 mg/dL (ref 8.6–10.3)
CHLORIDE: 121 mmol/L — ABNORMAL HIGH (ref 96–111)
CO2 TOTAL: 17 mmol/L — ABNORMAL LOW (ref 23–31)
CREATININE: 2.17 mg/dL — ABNORMAL HIGH (ref 0.60–1.05)
ESTIMATED GFR - FEMALE: 21 mL/min/BSA — ABNORMAL LOW (ref >=60)
GLUCOSE: 106 mg/dL — ABNORMAL HIGH (ref 70–99)
POTASSIUM: 4.2 mmol/L (ref 3.5–5.1)
PROTEIN TOTAL: 6.6 g/dL (ref 6.0–8.0)
SODIUM: 149 mmol/L — ABNORMAL HIGH (ref 136–145)

## 2023-10-24 LAB — CBC
HCT: 42.9 % (ref 34.8–46.0)
HGB: 13.1 g/dL (ref 11.5–16.0)
MCH: 29.5 pg (ref 26.0–32.0)
MCHC: 30.5 g/dL — ABNORMAL LOW (ref 31.0–35.5)
MCV: 96.6 fL (ref 78.0–100.0)
MPV: 9.3 fL (ref 8.7–12.5)
PLATELETS: 281 10*3/uL (ref 150–400)
RBC: 4.44 10*6/uL (ref 3.85–5.22)
RDW-CV: 15.4 % (ref 11.5–15.5)
WBC: 20.6 10*3/uL — ABNORMAL HIGH (ref 3.7–11.0)

## 2023-10-24 LAB — COMPREHENSIVE METABOLIC PANEL, NON-FASTING
ALBUMIN: 2.4 g/dL — ABNORMAL LOW (ref 3.4–4.8)
ALKALINE PHOSPHATASE: 64 U/L (ref 55–145)
ALT (SGPT): 15 U/L (ref <31)
ANION GAP: 11 mmol/L (ref 4–13)
AST (SGOT): 30 U/L (ref 11–34)
BILIRUBIN TOTAL: 0.6 mg/dL (ref 0.3–1.3)
BUN/CREA RATIO: 26 — ABNORMAL HIGH (ref 6–22)
BUN: 52 mg/dL — ABNORMAL HIGH (ref 8–25)
CALCIUM: 9.7 mg/dL (ref 8.6–10.3)
CHLORIDE: 121 mmol/L — ABNORMAL HIGH (ref 96–111)
CO2 TOTAL: 20 mmol/L — ABNORMAL LOW (ref 23–31)
CREATININE: 2.03 mg/dL — ABNORMAL HIGH (ref 0.60–1.05)
ESTIMATED GFR - FEMALE: 23 mL/min/BSA — ABNORMAL LOW (ref >=60)
GLUCOSE: 114 mg/dL (ref 65–125)
POTASSIUM: 4.2 mmol/L (ref 3.5–5.1)
PROTEIN TOTAL: 6.6 g/dL (ref 6.0–8.0)
SODIUM: 152 mmol/L — ABNORMAL HIGH (ref 136–145)

## 2023-10-24 LAB — LACTIC ACID LEVEL W/ REFLEX FOR LEVEL >2.0: LACTIC ACID: 1.5 mmol/L (ref 0.5–2.2)

## 2023-10-24 LAB — TROPONIN-I
TROPONIN-I HS: 154.6 ng/L (ref <=14.0)
TROPONIN-I HS: 174.6 ng/L (ref <=14.0)

## 2023-10-24 MED ORDER — SODIUM BICARBONATE 8.4 % (1 MEQ/ML) INTRAVENOUS SYRINGE
100.0000 meq | INJECTION | INTRAVENOUS | Status: AC
Start: 2023-10-24 — End: 2023-10-24
  Administered 2023-10-24: 100 meq via INTRAVENOUS
  Filled 2023-10-24: qty 100

## 2023-10-24 MED ORDER — METOPROLOL TARTRATE 5 MG/5 ML INTRAVENOUS SOLUTION
5.0000 mg | Freq: Four times a day (QID) | INTRAVENOUS | Status: DC | PRN
Start: 2023-10-24 — End: 2023-10-26
  Administered 2023-10-25: 5 mg via INTRAVENOUS
  Filled 2023-10-24: qty 5

## 2023-10-24 MED ORDER — FUROSEMIDE 10 MG/ML INJECTION SOLUTION
40.0000 mg | INTRAMUSCULAR | Status: AC
Start: 2023-10-24 — End: 2023-10-24
  Administered 2023-10-24: 40 mg via INTRAVENOUS
  Filled 2023-10-24: qty 4

## 2023-10-24 NOTE — Nurses Notes (Signed)
Troponin 154.6 Dr Anne Hahn notified, no new orders.

## 2023-10-24 NOTE — Progress Notes (Signed)
[facility]  Hospitalist  Progress Note    Date of Service:  10/24/2023  [name], [name], 89 y.o. female  Date of Admission:  10/21/2023  Date of Birth:  01-16-1929  PCP: Marcelino Freestone, FNP    HPI:  Yesterday morning the patient had a little more response in his to her daughter.  She would look at her when she would speak her name.  Yesterday evening the patient became more obtunded.  She has been essentially unresponsive since that time.  This morning she will only occasionally open her eyes slightly in his not seem to track around the room or really make eye contact.  Patient's renal function has worsened and the patient is not alert enough to take anything by mouth.  Electrolytes are still abnormal.  Initially MRI did not show any evidence of stroke.  However the patient is not moving her left side.  Clinic I feel the patient likely has had a stroke.  Family is interested in possibly another scan to see if there is evidence of stroke on the scan.  Told them we could do a repeat CT in the morning.  I had a lengthy discussion with the patient's daughter about her prognosis.  The patient is not responding as I would have expected with the antibiotics and evidence of urinary tract infection.  Her white blood cell count has gone higher and renal function is not improving.  She is actually becoming more edematous.  I told her that the patient may not be able to overcome the infection and clinically the patient appears to have had a stroke.  I talked to them about her prognosis being poor and that she possibly could pass away from her condition.  Family seems to understand and we are going to contact her son who lives in [place].    Current Facility-Administered Medications   Medication Dose Route Frequency    1/2 NS premix infusion   Intravenous Continuous    acetaminophen (TYLENOL) tablet  650 mg Oral Q4H PRN    aluminum-magnesium hydroxide-simethicone (MAG-AL PLUS) 200-200-20 mg per 5 mL  oral liquid  20 mL Oral Q4H PRN    [Held by provider] apixaban (ELIQUIS) tablet  2.5 mg Oral 2x/day    [Held by provider] aspirin (ECOTRIN) enteric coated tablet 81 mg  81 mg Oral Daily    [Held by provider] atorvastatin (LIPITOR) tablet  20 mg Oral QPM    cefTRIAXone (ROCEPHIN) 2 g in NS 100 mL IVPB minibag  2 g Intravenous Q24H    enoxaparin PF (LOVENOX) 80 mg/0.8 mL SubQ injection  1 mg/kg Subcutaneous Q24H    fentaNYL (SUBLIMAZE) 50 mcg/mL injection  25 mcg Intravenous Q2H PRN    hydrALAZINE (APRESOLINE) injection 10 mg  10 mg Intravenous Q6H PRN    [Held by provider] isosorbide mononitrate (IMDUR) 24 hr extended release tablet  60 mg Oral Daily    [Held by provider] levothyroxine (SYNTHROID) tablet  125 mcg Oral Daily    [Held by provider] magnesium hydroxide (MILK OF MAGNESIA) 400mg  per 5mL oral liquid  15 mL Oral Daily PRN    metoprolol (LOPRESSOR) 1 mg/mL injection  5 mg Intravenous Q6H PRN    [Held by provider] metoprolol succinate (TOPROL-XL) 24 hr extended release tablet  25 mg Oral Daily    NS flush syringe  10 mL Intracatheter Q8HRS    NS flush syringe  10 mL Intracatheter Q1H PRN    ondansetron (ZOFRAN) 2 mg/mL injection  4 mg Intravenous Q6H PRN        No Known Allergies          Filed Vitals:    10/23/23 2348 10/24/23 0404 10/24/23 0724 10/24/23 0800   BP: 133/69 (!) 131/40 (!) 153/57    Pulse: (!) 113 (!) 113 (!) 113    Resp: 18 18 (!) 24    Temp: 37.3 C (99.2 F) 36.7 C (98 F) 37.1 C (98.8 F)    SpO2:    95%     O2 delivery: Nasal Cannula      Physical Exam  Constitutional:       Comments: Patient is lying in bed supine, mouth open respirations even, does not appear to be labored at this time.  The patient is not respond to touch or to verbal stimuli, she does not attempt to move her extremities or open her eyes when I speak to her   HENT:      Head: Normocephalic and atraumatic.      Right Ear: External ear normal.      Left Ear: External ear normal.   Eyes:      Conjunctiva/sclera:  Conjunctivae normal.      Pupils: Pupils are equal, round, and reactive to light.   Neck:      Thyroid: No thyromegaly.      Vascular: No JVD.   Cardiovascular:      Rate and Rhythm: Regular rhythm. Tachycardia present.      Heart sounds: Murmur heard.      No friction rub. No gallop.   Pulmonary:      Effort: No respiratory distress.      Breath sounds: Normal breath sounds. No wheezing or rales.   Chest:      Chest wall: No tenderness.   Abdominal:      General: There is no distension.      Palpations: Abdomen is soft. There is no mass.      Tenderness: There is no abdominal tenderness. There is no guarding or rebound.      Comments: Sparse bowel sounds   Musculoskeletal:         General: No tenderness or deformity. Normal range of motion.      Cervical back: Normal range of motion and neck supple.      Right lower leg: Edema present.      Left lower leg: Edema present.      Comments: Deep vein thrombosis present in the lower legs as well as edema noted of the patient's hands and forearms bilaterally   Lymphadenopathy:      Cervical: No cervical adenopathy.   Skin:     General: Skin is warm and dry.      Coloration: Skin is not pale.      Findings: No rash.   Neurological:      Mental Status: She is unresponsive.      GCS: GCS eye subscore is 2. GCS verbal subscore is 1. GCS motor subscore is 4.      Deep Tendon Reflexes: Reflexes are normal and symmetric. Reflexes normal. Babinski sign present on the right side. Babinski sign present on the left side.      Comments: Patient is unresponsive, does have some response to pain, toes are up going with Babinski            Laboratory Data:     Results for orders placed or performed during the hospital encounter of 10/21/23 (from the past 24  hour(s))   BASIC METABOLIC PANEL - ONCE   Result Value Ref Range    SODIUM 151 (H) 136 - 145 mmol/L    POTASSIUM 4.0 3.5 - 5.1 mmol/L    CHLORIDE 122 (H) 96 - 111 mmol/L    CO2 TOTAL 21 (L) 23 - 31 mmol/L    ANION GAP 8 4 - 13 mmol/L     CALCIUM 9.2 8.6 - 10.3 mg/dL    GLUCOSE 270 65 - 623 mg/dL    BUN 50 (H) 8 - 25 mg/dL    CREATININE 7.62 (H) 0.60 - 1.05 mg/dL    BUN/CREA RATIO 26 (H) 6 - 22    ESTIMATED GFR - FEMALE 24 (L) >=60 mL/min/BSA   COMPREHENSIVE METABOLIC PNL, FASTING   Result Value Ref Range    SODIUM 149 (H) 136 - 145 mmol/L    POTASSIUM 4.2 3.5 - 5.1 mmol/L    CHLORIDE 121 (H) 96 - 111 mmol/L    CO2 TOTAL 17 (L) 23 - 31 mmol/L    ANION GAP 11 4 - 13 mmol/L    BUN 51 (H) 8 - 25 mg/dL    CREATININE 8.31 (H) 0.60 - 1.05 mg/dL    BUN/CREA RATIO 24 (H) 6 - 22    ALBUMIN 2.4 (L) 3.4 - 4.8 g/dL     CALCIUM 9.3 8.6 - 51.7 mg/dL    GLUCOSE 616 (H) 70 - 99 mg/dL    ALKALINE PHOSPHATASE 64 55 - 145 U/L    ALT (SGPT) 16 <31 U/L    AST (SGOT)  25 11 - 34 U/L    BILIRUBIN TOTAL 0.6 0.3 - 1.3 mg/dL    PROTEIN TOTAL 6.6 6.0 - 8.0 g/dL    ESTIMATED GFR - FEMALE 21 (L) >=60 mL/min/BSA   CBC WITH DIFF   Result Value Ref Range    WBC 20.3 (H) 3.7 - 11.0 x10^3/uL    RBC 4.56 3.85 - 5.22 x10^6/uL    HGB 13.2 11.5 - 16.0 g/dL    HCT 07.3 71.0 - 62.6 %    MCV 97.4 78.0 - 100.0 fL    MCH 28.9 26.0 - 32.0 pg    MCHC 29.7 (L) 31.0 - 35.5 g/dL    RDW-CV 94.8 (H) 54.6 - 15.5 %    PLATELETS 258 150 - 400 x10^3/uL    MPV 9.4 8.7 - 12.5 fL    NEUTROPHIL % 81.4 %    LYMPHOCYTE % 7.8 %    MONOCYTE % 9.3 %    EOSINOPHIL % 0.2 %    BASOPHIL % 0.4 %    NEUTROPHIL # 16.55 (H) 1.50 - 7.70 x10^3/uL    LYMPHOCYTE # 1.58 1.00 - 4.80 x10^3/uL    MONOCYTE # 1.90 (H) 0.20 - 1.10 x10^3/uL    EOSINOPHIL # <0.10 <=0.50 x10^3/uL    BASOPHIL # <0.10 <=0.20 x10^3/uL    IMMATURE GRANULOCYTE % 0.9 0.0 - 1.0 %    IMMATURE GRANULOCYTE # 0.18 (H) <0.10 x10^3/uL   CBC - AM ONCE   Result Value Ref Range    WBC 20.6 (H) 3.7 - 11.0 x10^3/uL    RBC 4.44 3.85 - 5.22 x10^6/uL    HGB 13.1 11.5 - 16.0 g/dL    HCT 27.0 35.0 - 09.3 %    MCV 96.6 78.0 - 100.0 fL    MCH 29.5 26.0 - 32.0 pg    MCHC 30.5 (L) 31.0 - 35.5 g/dL    RDW-CV 81.8 29.9 - 37.1 %  PLATELETS 281 150 - 400 x10^3/uL    MPV 9.3  8.7 - 12.5 fL   COMPREHENSIVE METABOLIC PANEL, NON-FASTING   Result Value Ref Range    SODIUM 152 (H) 136 - 145 mmol/L    POTASSIUM 4.2 3.5 - 5.1 mmol/L    CHLORIDE 121 (H) 96 - 111 mmol/L    CO2 TOTAL 20 (L) 23 - 31 mmol/L    ANION GAP 11 4 - 13 mmol/L    BUN 52 (H) 8 - 25 mg/dL    CREATININE 8.11 (H) 0.60 - 1.05 mg/dL    BUN/CREA RATIO 26 (H) 6 - 22    ALBUMIN 2.4 (L) 3.4 - 4.8 g/dL     CALCIUM 9.7 8.6 - 91.4 mg/dL    GLUCOSE 782 65 - 956 mg/dL    ALKALINE PHOSPHATASE 64 55 - 145 U/L    ALT (SGPT) 15 <31 U/L    AST (SGOT)  30 11 - 34 U/L    BILIRUBIN TOTAL 0.6 0.3 - 1.3 mg/dL    PROTEIN TOTAL 6.6 6.0 - 8.0 g/dL    ESTIMATED GFR - FEMALE 23 (L) >=60 mL/min/BSA   LACTIC ACID LEVEL W/ REFLEX FOR LEVEL >2.0   Result Value Ref Range    LACTIC ACID 1.5 0.5 - 2.2 mmol/L   TROPONIN-I   Result Value Ref Range    TROPONIN-I HS 154.6 (HH) <=14.0 ng/L ng/L       Imaging Studies:    * MRI BRAIN WO CONTRAST   Final Result by Edi, Radresults In (12/27 1243)   Chronic changes as above without acute intracranial abnormality.         JLF                     Radiologist location ID: [id number]         CT CHEST WO IV CONTRAST   Final Result by Edi, Radresults In (12/27 1245)   1. Small bilateral pleural effusions and bilateral lower lung atelectatic change.   2. Cardiomegaly and pulmonary artery prominence as above.   3. Posttreatment changes right chest wall.               The CT exam was performed using one or more of the following dose reduction techniques: Automated exposure control, adjustment of the mA and/or kV according to the patient's size, or use of iterative reconstruction technique.               Radiologist location ID: [id number]         XR AP MOBILE CHEST   Final Result by Edi, Radresults In (12/26 2050)   No acute process.                        Radiologist location ID: [id number]         CT LUMBAR SPINE WO IV CONTRAST   Final Result by Edi, Radresults In (12/25 0720)      No acute fracture of the lumbar spine.       Multilevel lumbar spondylosis. See description at each specific level above.      Mild scoliosis.          The CT exam was performed using one or more of the following dose reduction techniques: Automated exposure control, adjustment of the mA and/or kV according to the patient's size, or use of iterative reconstruction technique.  Radiologist location ID: [id number]         CT THORACIC SPINE WO IV CONTRAST   Final Result by Edi, Radresults In (12/25 2130)      No acute fracture or subluxation of the thoracic spine.      Moderate scoliosis. Mild multilevel degenerative change.      Mild motion artifact.             The CT exam was performed using one or more of the following dose reduction techniques: Automated exposure control, adjustment of the mA and/or kV according to the patient's size, or use of iterative reconstruction technique.               Radiologist location ID: [id number]         CT BRAIN WO IV CONTRAST   Final Result by Edi, Radresults In (12/25 0707)   No acute intracranial abnormality.               The CT exam was performed using one or more of the following dose reduction techniques: Automated exposure control, adjustment of the mA and/or kV according to the patient's size, or use of iterative reconstruction technique.               Radiologist location ID: [id number]         XR AP MOBILE CHEST   Final Result by Edi, Radresults In (12/25 0706)      No acute cardiopulmonary process. Chronic changes.             Radiologist location ID: [id number]             Assessment/Plan:  Hospital Problems    1AKI (acute kidney injury) (CMS [facility])         Date Noted: 10/21/2023      Hypernatremia         Date Noted: 10/23/2023      Urinary tract infection         Date Noted: 10/23/2023      Dehydration         Date Noted: 10/21/2023      Atrial fibrillation (CMS HCC)         Date Noted: 02/27/2017      Essential hypertension         Date Noted: 01/16/2017      Hyperlipidemia         Date  Noted: 01/16/2017      Hypothyroidism         Date Noted: 01/16/2017      Resolved Hospital Problems  No resolved problems to display.      Patient's prognosis is poor.  She is not responding to our medical treatments the patient continues to be obtunded.  She will likely has underlying infection with urinary tract infection now white blood cell count is going higher renal function is still poor she is becoming more edematous with IV fluid resuscitation and the patient's troponin is trending upward.  I spoke to the patient's daughter.  She understands the patient's prognosis is poor she would like to continue with treatments now to see if she has any response.  He would like to have a repeat CT to see if there is any evidence of stroke showing but clinically the patient has had a stroke.  She has upgoing Babinski of her toes bilaterally and the patient is not responding to verbal or touch stimuli.  Continue to monitor the patient continue fluids we have backed her  IV fluids down because she will be coming so edematous.  Continue to monitor ins and outs with the patient's renal function is worsened slightly and she is not having as much urine output.    Kellie Shropshire, MD    This note was partially created using MModal Fluency Direct system (voice recognition software) and is inherently subject to errors including those of syntax and "sound-alike" substitutions which may escape proofreading.  In such instances, original meaning may be extrapolated by contextual derivation.

## 2023-10-24 NOTE — Care Plan (Signed)
Problem: Skin Injury Risk Increased  Goal: Skin Health and Integrity  Outcome: Ongoing (see interventions/notes)  Intervention: Optimize Skin Protection  Recent Flowsheet Documentation  Taken 10/24/2023 1100 by Ronnald Ramp, RN  Pressure Reduction Techniques: Heels elevated off of the bed  Pressure Reduction Devices: Repositioning wedges/pillows utilized  Skin Protection: adhesive use limited  Taken 10/24/2023 0800 by Anne Hahn, Luverna Degenhart, RN  Pressure Reduction Techniques: Heels elevated off of the bed  Pressure Reduction Devices:   Repositioning wedges/pillows utilized   Heel offloading device utilized  Skin Protection: adhesive use limited  Head of Bed (HOB) Positioning:   HOB elevated   HOB at 30-45 degrees

## 2023-10-24 NOTE — Respiratory Therapy (Signed)
No acute distress noted.

## 2023-10-24 NOTE — Nurses Notes (Signed)
Trop increased 174.6 J Baughman PA notified no new orders.

## 2023-10-25 ENCOUNTER — Inpatient Hospital Stay (HOSPITAL_COMMUNITY): Payer: MEDICARE

## 2023-10-25 LAB — URINALYSIS, MACROSCOPIC
BILIRUBIN: NEGATIVE mg/dL
GLUCOSE: NEGATIVE mg/dL
KETONES: NEGATIVE mg/dL
NITRITE: NEGATIVE
PH: 5.5 (ref 5.0–8.5)
SPECIFIC GRAVITY: 1.025 (ref 1.005–1.030)
UROBILINOGEN: 0.2 mg/dL

## 2023-10-25 LAB — CBC WITH DIFF
BASOPHIL #: <0.1 10*3/uL (ref <=0.20)
BASOPHIL %: 0.2 %
EOSINOPHIL #: <0.1 10*3/uL (ref <=0.50)
EOSINOPHIL %: 0 %
HCT: 42 % (ref 34.8–46.0)
HGB: 12.8 g/dL (ref 11.5–16.0)
IMMATURE GRANULOCYTE #: 0.17 10*3/uL — ABNORMAL HIGH (ref <0.10)
IMMATURE GRANULOCYTE %: 0.8 % (ref 0.0–1.0)
LYMPHOCYTE #: 1.23 10*3/uL (ref 1.00–4.80)
LYMPHOCYTE %: 5.8 %
MCH: 29.6 pg (ref 26.0–32.0)
MCHC: 30.5 g/dL — ABNORMAL LOW (ref 31.0–35.5)
MCV: 97.2 fL (ref 78.0–100.0)
MONOCYTE #: 1.84 10*3/uL — ABNORMAL HIGH (ref 0.20–1.10)
MONOCYTE %: 8.7 %
MPV: 9.7 fL (ref 8.7–12.5)
NEUTROPHIL #: 17.82 10*3/uL — ABNORMAL HIGH (ref 1.50–7.70)
NEUTROPHIL %: 84.5 %
PLATELETS: 251 10*3/uL (ref 150–400)
RBC: 4.32 10*6/uL (ref 3.85–5.22)
RDW-CV: 15.4 % (ref 11.5–15.5)
WBC: 21.1 10*3/uL — ABNORMAL HIGH (ref 3.7–11.0)

## 2023-10-25 LAB — CAROTID ARTERY DUPLEX
Left CCA dist dias: 12.6 cm/s
Left CCA dist sys: 64 cm/s
Left CCA mid dias: 5.2
Left CCA mid dias: 8.4
Left CCA mid sys: 56
Left CCA prox dias: 14.3 cm/s
Left CCA prox sys: 61.4 cm/s
Left Carotid Bubl Sys: 37.5
Left Carotid Bulb Dias: 7.2
Left ECA sys: 240.1 cm/s
Left ICA dist dias: 40.1 cm/s
Left ICA dist sys: 146.3 cm/s
Left ICA mid dias: 44.7 cm/s
Left ICA mid sys: 141 cm/s
Left ICA prox dias: 22.8 cm/s
Left ICA prox sys: 89.5 cm/s
Left ICA/CCA sys: 2.29
Left vertebral sys: 59.6 cm/s
Right CCA dist dias: 7.2 cm/s
Right CCA mid sys: 24.5
Right CCA prox dias: 3 cm/s
Right CCA prox sys: 30.6 cm/s
Right Carotid Bulb Dias: 6.3
Right Carotid Bulb Sys: 26.4
Right cca dist sys: 26.9 cm/s
Right eca sys: 364.9 cm/s
Right vertebral sys: 31.6 cm/s

## 2023-10-25 LAB — COMPREHENSIVE METABOLIC PNL, FASTING
ALBUMIN: 2.3 g/dL — ABNORMAL LOW (ref 3.4–4.8)
ALKALINE PHOSPHATASE: 61 U/L (ref 55–145)
ALT (SGPT): 16 U/L (ref <31)
ANION GAP: 11 mmol/L (ref 4–13)
AST (SGOT): 28 U/L (ref 11–34)
BILIRUBIN TOTAL: 0.5 mg/dL (ref 0.3–1.3)
BUN/CREA RATIO: 23 — ABNORMAL HIGH (ref 6–22)
BUN: 65 mg/dL — ABNORMAL HIGH (ref 8–25)
CALCIUM: 9.9 mg/dL (ref 8.6–10.3)
CHLORIDE: 121 mmol/L — ABNORMAL HIGH (ref 96–111)
CO2 TOTAL: 20 mmol/L — ABNORMAL LOW (ref 23–31)
CREATININE: 2.78 mg/dL — ABNORMAL HIGH (ref 0.60–1.05)
ESTIMATED GFR - FEMALE: 15 mL/min/BSA — ABNORMAL LOW (ref >=60)
GLUCOSE: 101 mg/dL — ABNORMAL HIGH (ref 70–99)
POTASSIUM: 4.7 mmol/L (ref 3.5–5.1)
PROTEIN TOTAL: 6.5 g/dL (ref 6.0–8.0)
SODIUM: 152 mmol/L — ABNORMAL HIGH (ref 136–145)

## 2023-10-25 LAB — TROPONIN-I
TROPONIN-I HS: 276.1 ng/L (ref <=14.0)
TROPONIN-I HS: 286.8 ng/L (ref <=14.0)

## 2023-10-25 LAB — URINE CULTURE,ROUTINE: URINE CULTURE: NO GROWTH

## 2023-10-25 MED ORDER — DEXTROSE 5 % AND 0.45 % SODIUM CHLORIDE INTRAVENOUS SOLUTION
INTRAVENOUS | Status: DC
Start: 2023-10-25 — End: 2023-10-26
  Administered 2023-10-26: 0 mL via INTRAVENOUS

## 2023-10-25 MED ORDER — ACETAMINOPHEN 1,000 MG/100 ML (10 MG/ML) INTRAVENOUS SOLUTION
1000.0000 mg | Freq: Four times a day (QID) | INTRAVENOUS | Status: AC | PRN
Start: 2023-10-25 — End: 2023-10-26
  Administered 2023-10-25: 0 mg via INTRAVENOUS
  Administered 2023-10-25: 1000 mg via INTRAVENOUS
  Administered 2023-10-26: 0 mg via INTRAVENOUS
  Administered 2023-10-26 (×2): 1000 mg via INTRAVENOUS
  Administered 2023-10-26: 0 mg via INTRAVENOUS
  Filled 2023-10-25 (×3): qty 100

## 2023-10-25 NOTE — Progress Notes (Signed)
[facility]  Hospitalist  Progress Note    Date of Service:  10/25/2023  [name], [name], 89 y.o. female  Date of Admission:  10/21/2023  Date of Birth:  31-Dec-1928  PCP: Marcelino Freestone, FNP    HPI:  Patient is in bed this morning, her son had come in from  as well as a granddaughter from [place].  The patient is a little more alert opening her eyes and looking at family.  She has some mild at them.  She is still not moving her left arm.  She did move her feet for [place] this morning.  She was having some difficulty controlling her secretions and had to be suctioned a couple times overnight.  She has not been a large enough to be able to try to eat so I am uncertain about her swallowing    Current Facility-Administered Medications   Medication Dose Route Frequency    acetaminophen (TYLENOL) tablet  650 mg Oral Q4H PRN    aluminum-magnesium hydroxide-simethicone (MAG-AL PLUS) 200-200-20 mg per 5 mL oral liquid  20 mL Oral Q4H PRN    [Held by provider] apixaban (ELIQUIS) tablet  2.5 mg Oral 2x/day    [Held by provider] aspirin (ECOTRIN) enteric coated tablet 81 mg  81 mg Oral Daily    [Held by provider] atorvastatin (LIPITOR) tablet  20 mg Oral QPM    cefTRIAXone (ROCEPHIN) 2 g in NS 100 mL IVPB minibag  2 g Intravenous Q24H    D5W 1/2 NS premix infusion   Intravenous Continuous    enoxaparin PF (LOVENOX) 80 mg/0.8 mL SubQ injection  1 mg/kg Subcutaneous Q24H    fentaNYL (SUBLIMAZE) 50 mcg/mL injection  25 mcg Intravenous Q2H PRN    hydrALAZINE (APRESOLINE) injection 10 mg  10 mg Intravenous Q6H PRN    [Held by provider] isosorbide mononitrate (IMDUR) 24 hr extended release tablet  60 mg Oral Daily    [Held by provider] levothyroxine (SYNTHROID) tablet  125 mcg Oral Daily    [Held by provider] magnesium hydroxide (MILK OF MAGNESIA) 400mg  per 5mL oral liquid  15 mL Oral Daily PRN    metoprolol (LOPRESSOR) 1 mg/mL injection  5 mg Intravenous Q6H PRN    [Held by provider] metoprolol  succinate (TOPROL-XL) 24 hr extended release tablet  25 mg Oral Daily    NS flush syringe  10 mL Intracatheter Q8HRS    NS flush syringe  10 mL Intracatheter Q1H PRN    ondansetron (ZOFRAN) 2 mg/mL injection  4 mg Intravenous Q6H PRN        No Known Allergies          Filed Vitals:    10/24/23 2039 10/24/23 2325 10/25/23 0344 10/25/23 0735   BP: 136/70 122/65 123/74 126/84   Pulse: (!) 118 (!) 116 (!) 121 (!) 113   Resp: (!) 22 (!) 22 (!) 22 20   Temp: 36.7 C (98 F) 37.1 C (98.7 F) 37.2 C (98.9 F) (!) 38.2 C (100.8 F)   SpO2:         O2 delivery: Nasal Cannula      Physical Exam  Constitutional:       Comments: Patient is lying in bed, eyes are open she is looking at her son who has come to visit, patient appears to recognize him but is not speaking did smile wants, she will occasionally squeeze her right hand but not on command she is not moving her left arm, she did  move her feet when asked to move her feet but did not stop when asked to.  She is having difficulty following commands   HENT:      Head: Normocephalic and atraumatic.      Right Ear: External ear normal.      Left Ear: External ear normal.   Eyes:      Conjunctiva/sclera: Conjunctivae normal.      Pupils: Pupils are equal, round, and reactive to light.   Neck:      Thyroid: No thyromegaly.      Vascular: No JVD.   Cardiovascular:      Rate and Rhythm: Normal rate and regular rhythm.      Heart sounds: Murmur heard.      No friction rub. No gallop.   Pulmonary:      Effort: No respiratory distress.      Breath sounds: Normal breath sounds. No wheezing or rales.   Chest:      Chest wall: No tenderness.   Abdominal:      General: Bowel sounds are normal. There is no distension.      Palpations: Abdomen is soft. There is no mass.      Tenderness: There is no abdominal tenderness. There is no guarding or rebound.   Musculoskeletal:         General: No tenderness or deformity. Normal range of motion.      Cervical back: Normal range of motion and  neck supple.      Right lower leg: Edema present.      Left lower leg: Edema present.      Comments: Trace edema in the ankles, there is 2+ edema the patient's left arm and 1+ edema the patient's right arm   Lymphadenopathy:      Cervical: No cervical adenopathy.   Skin:     General: Skin is warm and dry.      Coloration: Skin is not pale.      Findings: Bruising present. No rash.      Comments: Some scattered bruising the patient's upper extremities from IV sticks   Neurological:      Mental Status: She is alert.      GCS: GCS eye subscore is 3. GCS verbal subscore is 2. GCS motor subscore is 4.      Cranial Nerves: No cranial nerve deficit.      Motor: Weakness present.      Comments: Patient with eyes open, she is not following commands but did look to the left today which she was not doing previously, she will move her right arm by sliding and on her abdomen but will not squeeze hand to command, has not moved her left arm toes are upgoing bilaterally but she is moving her feet some on command but does not.  When asked to stop moving her feet    Patient not following commands well enough to assess coordination            Laboratory Data:     Results for orders placed or performed during the hospital encounter of 10/21/23 (from the past 24 hour(s))   CBC - AM ONCE   Result Value Ref Range    WBC 20.6 (H) 3.7 - 11.0 x10^3/uL    RBC 4.44 3.85 - 5.22 x10^6/uL    HGB 13.1 11.5 - 16.0 g/dL    HCT 47.8 29.5 - 62.1 %    MCV 96.6 78.0 - 100.0 fL    MCH 29.5  26.0 - 32.0 pg    MCHC 30.5 (L) 31.0 - 35.5 g/dL    RDW-CV 04.5 40.9 - 81.1 %    PLATELETS 281 150 - 400 x10^3/uL    MPV 9.3 8.7 - 12.5 fL   COMPREHENSIVE METABOLIC PANEL, NON-FASTING   Result Value Ref Range    SODIUM 152 (H) 136 - 145 mmol/L    POTASSIUM 4.2 3.5 - 5.1 mmol/L    CHLORIDE 121 (H) 96 - 111 mmol/L    CO2 TOTAL 20 (L) 23 - 31 mmol/L    ANION GAP 11 4 - 13 mmol/L    BUN 52 (H) 8 - 25 mg/dL    CREATININE 9.14 (H) 0.60 - 1.05 mg/dL    BUN/CREA RATIO 26 (H) 6  - 22    ALBUMIN 2.4 (L) 3.4 - 4.8 g/dL     CALCIUM 9.7 8.6 - 78.2 mg/dL    GLUCOSE 956 65 - 213 mg/dL    ALKALINE PHOSPHATASE 64 55 - 145 U/L    ALT (SGPT) 15 <31 U/L    AST (SGOT)  30 11 - 34 U/L    BILIRUBIN TOTAL 0.6 0.3 - 1.3 mg/dL    PROTEIN TOTAL 6.6 6.0 - 8.0 g/dL    ESTIMATED GFR - FEMALE 23 (L) >=60 mL/min/BSA   LACTIC ACID LEVEL W/ REFLEX FOR LEVEL >2.0   Result Value Ref Range    LACTIC ACID 1.5 0.5 - 2.2 mmol/L   TROPONIN-I   Result Value Ref Range    TROPONIN-I HS 154.6 (HH) <=14.0 ng/L ng/L   TROPONIN-I   Result Value Ref Range    TROPONIN-I HS 174.6 (HH) <=14.0 ng/L ng/L   TROPONIN-I   Result Value Ref Range    TROPONIN-I HS 276.1 (HH) <=14.0 ng/L ng/L   COMPREHENSIVE METABOLIC PNL, FASTING   Result Value Ref Range    SODIUM 152 (H) 136 - 145 mmol/L    POTASSIUM 4.7 3.5 - 5.1 mmol/L    CHLORIDE 121 (H) 96 - 111 mmol/L    CO2 TOTAL 20 (L) 23 - 31 mmol/L    ANION GAP 11 4 - 13 mmol/L    BUN 65 (H) 8 - 25 mg/dL    CREATININE 0.86 (H) 0.60 - 1.05 mg/dL    BUN/CREA RATIO 23 (H) 6 - 22    ALBUMIN 2.3 (L) 3.4 - 4.8 g/dL     CALCIUM 9.9 8.6 - 57.8 mg/dL    GLUCOSE 469 (H) 70 - 99 mg/dL    ALKALINE PHOSPHATASE 61 55 - 145 U/L    ALT (SGPT) 16 <31 U/L    AST (SGOT)  28 11 - 34 U/L    BILIRUBIN TOTAL 0.5 0.3 - 1.3 mg/dL    PROTEIN TOTAL 6.5 6.0 - 8.0 g/dL    ESTIMATED GFR - FEMALE 15 (L) >=60 mL/min/BSA   CBC WITH DIFF   Result Value Ref Range    WBC 21.1 (H) 3.7 - 11.0 x10^3/uL    RBC 4.32 3.85 - 5.22 x10^6/uL    HGB 12.8 11.5 - 16.0 g/dL    HCT 62.9 52.8 - 41.3 %    MCV 97.2 78.0 - 100.0 fL    MCH 29.6 26.0 - 32.0 pg    MCHC 30.5 (L) 31.0 - 35.5 g/dL    RDW-CV 24.4 01.0 - 27.2 %    PLATELETS 251 150 - 400 x10^3/uL    MPV 9.7 8.7 - 12.5 fL    NEUTROPHIL % 84.5 %  LYMPHOCYTE % 5.8 %    MONOCYTE % 8.7 %    EOSINOPHIL % 0.0 %    BASOPHIL % 0.2 %    NEUTROPHIL # 17.82 (H) 1.50 - 7.70 x10^3/uL    LYMPHOCYTE # 1.23 1.00 - 4.80 x10^3/uL    MONOCYTE # 1.84 (H) 0.20 - 1.10 x10^3/uL    EOSINOPHIL # <0.10 <=0.50  x10^3/uL    BASOPHIL # <0.10 <=0.20 x10^3/uL    IMMATURE GRANULOCYTE % 0.8 0.0 - 1.0 %    IMMATURE GRANULOCYTE # 0.17 (H) <0.10 x10^3/uL   TROPONIN-I   Result Value Ref Range    TROPONIN-I HS 286.8 (HH) <=14.0 ng/L ng/L       Imaging Studies:    * CT BRAIN WO IV CONTRAST   Final Result by Edi, Radresults In (12/29 0801)   No evidence for acute intracranial process. Chronic white matter microvascular ischemic change.               The CT exam was performed using one or more of the following dose reduction techniques: Automated exposure control, adjustment of the mA and/or kV according to the patient's size, or use of iterative reconstruction technique.               Radiologist location ID: [id number]         * MRI BRAIN WO CONTRAST   Final Result by Edi, Radresults In (12/27 1243)   Chronic changes as above without acute intracranial abnormality.         JLF                     Radiologist location ID: [id number]         CT CHEST WO IV CONTRAST   Final Result by Edi, Radresults In (12/27 1245)   1. Small bilateral pleural effusions and bilateral lower lung atelectatic change.   2. Cardiomegaly and pulmonary artery prominence as above.   3. Posttreatment changes right chest wall.               The CT exam was performed using one or more of the following dose reduction techniques: Automated exposure control, adjustment of the mA and/or kV according to the patient's size, or use of iterative reconstruction technique.               Radiologist location ID: [id number]         XR AP MOBILE CHEST   Final Result by Edi, Radresults In (12/26 2050)   No acute process.                        Radiologist location ID: [id number]         CT LUMBAR SPINE WO IV CONTRAST   Final Result by Edi, Radresults In (12/25 0720)      No acute fracture of the lumbar spine.      Multilevel lumbar spondylosis. See description at each specific level above.      Mild scoliosis.          The CT exam was performed using one or more of the  following dose reduction techniques: Automated exposure control, adjustment of the mA and/or kV according to the patient's size, or use of iterative reconstruction technique.               Radiologist location ID: [id number]         CT THORACIC SPINE WO IV CONTRAST  Final Result by Edi, Radresults In (12/25 9563)      No acute fracture or subluxation of the thoracic spine.      Moderate scoliosis. Mild multilevel degenerative change.      Mild motion artifact.             The CT exam was performed using one or more of the following dose reduction techniques: Automated exposure control, adjustment of the mA and/or kV according to the patient's size, or use of iterative reconstruction technique.               Radiologist location ID: [id number]         CT BRAIN WO IV CONTRAST   Final Result by Edi, Radresults In (12/25 0707)   No acute intracranial abnormality.               The CT exam was performed using one or more of the following dose reduction techniques: Automated exposure control, adjustment of the mA and/or kV according to the patient's size, or use of iterative reconstruction technique.               Radiologist location ID: [id number]         XR AP MOBILE CHEST   Final Result by Edi, Radresults In (12/25 0706)      No acute cardiopulmonary process. Chronic changes.             Radiologist location ID: [id number]                Assessment/Plan:  Hospital Problems    1AKI (acute kidney injury) (CMS [facility])         Date Noted: 10/21/2023      Hypernatremia         Date Noted: 10/23/2023      Urinary tract infection         Date Noted: 10/23/2023      Dehydration         Date Noted: 10/21/2023      Atrial fibrillation (CMS HCC)         Date Noted: 02/27/2017      Essential hypertension         Date Noted: 01/16/2017      Hyperlipidemia         Date Noted: 01/16/2017      Hypothyroidism         Date Noted: 01/16/2017      Resolved Hospital Problems  No resolved problems to display.      I had a lengthy  discussion with the patient's family.  She has elevated troponin now which I do not necessarily believe to be from a true MI. she has had no dysrhythmia but does have a previous history of decreased ejection fraction 40 percent.  With her decreased kidney function this is likely elevated troponin from lack of clearance.  Patient's renal function has worsened continues to have edema.  Patient has not been able to be awake enough for follow commands well enough to try to swallow.  We will need to have speech evaluation if she is more alert I did talk to the patient's family about prognosis and that I believe she has had a global ischemic event to her brain.  No focal areas showing but the patient has had a dramatic decline in the last four days.  She is a little more alert today but still not following commands well and will quickly closed her eyes and go back  to sleep if she is not having somebody hold her hand or talk directly into her ear.  Family asked about possibly having TPN however the patient is would require a central line and they were not certain they wanted a central line.  Also the patient's total protein is still normal so I think he has been in his starting her on some D5 normal saline would be beneficial at this point.      Kellie Shropshire, MD    This note was partially created using MModal Fluency Direct system (voice recognition software) and is inherently subject to errors including those of syntax and "sound-alike" substitutions which may escape proofreading.  In such instances, original meaning may be extrapolated by contextual derivation.

## 2023-10-26 ENCOUNTER — Inpatient Hospital Stay (HOSPITAL_COMMUNITY): Payer: MEDICARE

## 2023-10-26 LAB — CBC WITH DIFF
BASOPHIL #: <0.1 10*3/uL (ref <=0.20)
BASOPHIL %: 0.4 %
EOSINOPHIL #: 0.1 10*3/uL (ref <=0.50)
EOSINOPHIL %: 0.7 %
HCT: 36.7 % (ref 34.8–46.0)
HGB: 11.4 g/dL — ABNORMAL LOW (ref 11.5–16.0)
IMMATURE GRANULOCYTE #: <0.1 10*3/uL (ref <0.10)
IMMATURE GRANULOCYTE %: 0.6 % (ref 0.0–1.0)
LYMPHOCYTE #: 1.86 10*3/uL (ref 1.00–4.80)
LYMPHOCYTE %: 13.9 %
MCH: 29.5 pg (ref 26.0–32.0)
MCHC: 31.1 g/dL (ref 31.0–35.5)
MCV: 95.1 fL (ref 78.0–100.0)
MONOCYTE #: 1.39 10*3/uL — ABNORMAL HIGH (ref 0.20–1.10)
MONOCYTE %: 10.4 %
MPV: 9.6 fL (ref 8.7–12.5)
NEUTROPHIL #: 9.93 10*3/uL — ABNORMAL HIGH (ref 1.50–7.70)
NEUTROPHIL %: 74 %
PLATELETS: 235 10*3/uL (ref 150–400)
RBC: 3.86 10*6/uL (ref 3.85–5.22)
RDW-CV: 15.5 % (ref 11.5–15.5)
WBC: 13.4 10*3/uL — ABNORMAL HIGH (ref 3.7–11.0)

## 2023-10-26 LAB — COMPREHENSIVE METABOLIC PNL, FASTING
ALBUMIN: 2 g/dL — ABNORMAL LOW (ref 3.4–4.8)
ALKALINE PHOSPHATASE: 49 U/L — ABNORMAL LOW (ref 55–145)
ALT (SGPT): 21 U/L (ref <31)
ANION GAP: 6 mmol/L (ref 4–13)
AST (SGOT): 39 U/L — ABNORMAL HIGH (ref 11–34)
BILIRUBIN TOTAL: 0.5 mg/dL (ref 0.3–1.3)
BUN/CREA RATIO: 28 — ABNORMAL HIGH (ref 6–22)
BUN: 84 mg/dL — ABNORMAL HIGH (ref 8–25)
CALCIUM: 9.6 mg/dL (ref 8.6–10.3)
CHLORIDE: 120 mmol/L — ABNORMAL HIGH (ref 96–111)
CO2 TOTAL: 22 mmol/L — ABNORMAL LOW (ref 23–31)
CREATININE: 2.99 mg/dL — ABNORMAL HIGH (ref 0.60–1.05)
ESTIMATED GFR - FEMALE: 14 mL/min/BSA — ABNORMAL LOW (ref >=60)
GLUCOSE: 110 mg/dL — ABNORMAL HIGH (ref 70–99)
POTASSIUM: 4.2 mmol/L (ref 3.5–5.1)
PROTEIN TOTAL: 5.9 g/dL — ABNORMAL LOW (ref 6.0–8.0)
SODIUM: 148 mmol/L — ABNORMAL HIGH (ref 136–145)

## 2023-10-26 LAB — TROPONIN-I: TROPONIN-I HS: 347.1 ng/L (ref <=14.0)

## 2023-10-26 LAB — LACTIC ACID LEVEL W/ REFLEX FOR LEVEL >2.0: LACTIC ACID: 1.4 mmol/L (ref 0.5–2.2)

## 2023-10-26 MED ORDER — FENTANYL (PF) 50 MCG/ML INJECTION WRAPPER
50.0000 ug | INJECTION | INTRAMUSCULAR | Status: DC | PRN
Start: 2023-10-26 — End: 2023-10-28

## 2023-10-26 MED ORDER — MORPHINE 2 MG/ML INJECTION WRAPPER
2.0000 mg | INJECTION | INTRAMUSCULAR | Status: DC | PRN
Start: 2023-10-26 — End: 2023-10-28
  Administered 2023-10-26 – 2023-10-27 (×2): 2 mg via INTRAVENOUS
  Filled 2023-10-26 (×2): qty 1

## 2023-10-26 MED ORDER — METRONIDAZOLE 500 MG/100 ML IN SODIUM CHLOR(ISO) INTRAVENOUS PIGGYBACK
500.0000 mg | INJECTION | Freq: Two times a day (BID) | INTRAVENOUS | Status: DC
Start: 2023-10-26 — End: 2023-10-26
  Administered 2023-10-26: 500 mg via INTRAVENOUS
  Administered 2023-10-26: 0 mg via INTRAVENOUS
  Filled 2023-10-26: qty 100

## 2023-10-26 MED ORDER — ENOXAPARIN 80 MG/0.8 ML SUBCUTANEOUS SYRINGE
1.0000 mg/kg | INJECTION | Freq: Every day | SUBCUTANEOUS | Status: DC
Start: 2023-10-26 — End: 2023-10-26
  Administered 2023-10-26: 80 mg via SUBCUTANEOUS
  Filled 2023-10-26: qty 0.8

## 2023-10-26 MED ORDER — LACTATED RINGERS IV BOLUS
1000.0000 mL | INJECTION | Status: AC
Start: 2023-10-26 — End: 2023-10-26
  Administered 2023-10-26: 1000 mL via INTRAVENOUS
  Administered 2023-10-26: 0 mL via INTRAVENOUS

## 2023-10-26 MED ORDER — MORPHINE 4 MG/ML INJECTION WRAPPER
4.0000 mg | INJECTION | INTRAMUSCULAR | Status: DC | PRN
Start: 2023-10-26 — End: 2023-10-28

## 2023-10-26 MED ORDER — LORAZEPAM 2 MG/ML INJECTION WRAPPER
1.0000 mg | INTRAMUSCULAR | Status: DC | PRN
Start: 2023-10-26 — End: 2023-10-28
  Administered 2023-10-27: 1 mg via INTRAVENOUS
  Filled 2023-10-26 (×2): qty 1

## 2023-10-26 NOTE — Care Plan (Signed)
Problem: Acute Kidney Injury/Impairment  Goal: Fluid and Electrolyte Balance  Outcome: Ongoing (see interventions/notes)     Problem: Heart Failure  Goal: Optimal Coping  Outcome: Ongoing (see interventions/notes)     Problem: Heart Failure  Goal: Stable Heart Rate and Rhythm  Outcome: Ongoing (see interventions/notes)     Problem: Heart Failure  Goal: Effective Oxygenation and Ventilation  Outcome: Ongoing (see interventions/notes)

## 2023-10-26 NOTE — Nurses Notes (Signed)
Patient's temp 101.2, PRN IV tylenol given.

## 2023-10-26 NOTE — Progress Notes (Signed)
I spoke to the patient's family at length.  I discussed that her findings are showing considerable worsening.  Renal function is worsening she is started become tachycardic and was febrile.  I do not have an obvious source of infection.  Obvious source will be with urinary tract infection but she has not been responding to the current antibiotics we did CT of her belly which showed no obvious abscess or area of necrosis or identifiable source of the problem.  We had added metronidazole.  Patient does have periods where she is little more alert and will speak to family the most part is becoming rather withdrawn in his lying in bed with her mouth open.  The patient is not having much conversation at all and he is now requiring 4 liters supplemental O2.  The patient's family is agreeable to pursue comfort measures at this time.  I have adjusted orders and they are agreeable to using Ativan and morphine as needed for comfort.

## 2023-10-26 NOTE — Care Management Notes (Signed)
#   24 gauge angiocath placed to right distal lateral arm. Good blood return. Flushed with 10 ml NSS. Secured with tegaderm.

## 2023-10-26 NOTE — Nurses Notes (Signed)
Patient was awake briefly this evening,  said her mouth was dry,  took a few sips of water.   When asked if she was in any pain,  she answered "no".  Patient was switched to oxymask with humidification for night time.   Heart rate continues to fluctuate between 90's and 120's.

## 2023-10-26 NOTE — Nurses Notes (Signed)
Pt is more alert this evening family members at bedside.  Orders received for comfort measures.

## 2023-10-26 NOTE — Nurses Notes (Signed)
Pt not verbally responding, O2 patent per mask.  Left arm swollen, med lock patent in left AC.  Family members at bedside.

## 2023-10-26 NOTE — Speech Therapy (Signed)
SLP to defer pt this date secondary to decreased alertness and decreased ability to participate in evaluation. SLP to f/u next available date.

## 2023-10-26 NOTE — Progress Notes (Signed)
[facility]  Hospitalist  Progress Note    Date of Service:  10/26/2023  [name], [name], 89 y.o. female  Date of Admission:  10/21/2023  Date of Birth:  October 18, 1929  PCP: [name] Freestone, FNP    HPI:  Patient had a fair day yesterday and was more alert talking to family and was able to drink a small amount of water.  She had set up for a straight to do this but seemed to swallow okay.  However overnight the patient is become much more confused nearly obtunded this morning and is febrile.  She appears very uncomfortable often times moving frequent was in the bed.  Her granddaughter was able to get her to answer if she was in pain and the patient has stated no however on palpation of her abdomen she appears to have some discomfort in her abdomen.  Chest x-ray was performed because she was requiring more supplemental O2 and there is some air above the liver on the right this looks like it may be colonic gas however I am still concerned there may be some source of her infection/sepsis within the abdomen.  White blood cell count was elevated for two days she does have bacteria in her urine but does not have a clear source.    Current Facility-Administered Medications   Medication Dose Route Frequency    acetaminophen (OFIRMEV) 1,000 mg (10 mg/mL) IV 100 mL (tot vol)  1,000 mg Intravenous Q6H PRN    [Held by provider] acetaminophen (TYLENOL) tablet  650 mg Oral Q4H PRN    aluminum-magnesium hydroxide-simethicone (MAG-AL PLUS) 200-200-20 mg per 5 mL oral liquid  20 mL Oral Q4H PRN    [Held by provider] apixaban (ELIQUIS) tablet  2.5 mg Oral 2x/day    [Held by provider] aspirin (ECOTRIN) enteric coated tablet 81 mg  81 mg Oral Daily    [Held by provider] atorvastatin (LIPITOR) tablet  20 mg Oral QPM    cefTRIAXone (ROCEPHIN) 2 g in NS 100 mL IVPB minibag  2 g Intravenous Q24H    D5W 1/2 NS premix infusion   Intravenous Continuous    enoxaparin PF (LOVENOX) 80 mg/0.8 mL SubQ injection  1 mg/kg  Subcutaneous Q24H    fentaNYL (SUBLIMAZE) 50 mcg/mL injection  25 mcg Intravenous Q2H PRN    hydrALAZINE (APRESOLINE) injection 10 mg  10 mg Intravenous Q6H PRN    [Held by provider] isosorbide mononitrate (IMDUR) 24 hr extended release tablet  60 mg Oral Daily    [Held by provider] levothyroxine (SYNTHROID) tablet  125 mcg Oral Daily    [Held by provider] magnesium hydroxide (MILK OF MAGNESIA) 400mg  per 5mL oral liquid  15 mL Oral Daily PRN    metoprolol (LOPRESSOR) 1 mg/mL injection  5 mg Intravenous Q6H PRN    [Held by provider] metoprolol succinate (TOPROL-XL) 24 hr extended release tablet  25 mg Oral Daily    metroNIDAZOLE (FLAGYL) 500 mg in NS 100 mL premix IVPB  500 mg Intravenous Q12H    NS flush syringe  10 mL Intracatheter Q8HRS    NS flush syringe  10 mL Intracatheter Q1H PRN    ondansetron (ZOFRAN) 2 mg/mL injection  4 mg Intravenous Q6H PRN        No Known Allergies          Filed Vitals:    10/26/23 0503 10/26/23 0711 10/26/23 0804 10/26/23 1110   BP: (!) 152/68 (!) 111/92  (!) 159/85   Pulse: (!) 119 Marland Kitchen)  119  (!) 119   Resp: (!) 24 (!) 24  (!) 24   Temp: (!) 38.4 C (101.2 F) 37.7 C (99.9 F)  37.8 C (100 F)   SpO2:   96%      O2 delivery: OxyMask      Physical Exam  Constitutional:       Comments: Patient appears very uncomfortable, currently wearing OxyMask at 4 liters   HENT:      Head: Normocephalic and atraumatic.      Right Ear: External ear normal.      Left Ear: External ear normal.   Eyes:      Comments: Patient will not open her eyes to command pupils are still reactive   Neck:      Thyroid: No thyromegaly.      Vascular: No JVD.   Cardiovascular:      Rate and Rhythm: Regular rhythm. Tachycardia present.      Heart sounds: Murmur heard.      No friction rub. No gallop.   Pulmonary:      Effort: No respiratory distress.      Breath sounds: Rhonchi present. No wheezing or rales.      Comments: Coarse breath sounds in the bases bilaterally  Chest:      Chest wall: No tenderness.    Abdominal:      General: There is no distension.      Palpations: There is no mass.      Tenderness: There is abdominal tenderness. There is no guarding or rebound.      Comments: Decreased bowel sounds with some apparent tenderness in the right side of the abdomen, patient has some grimacing when I am doing abdominal palpation but no true rebound or guarding noted however the patient is very lethargic right now   Musculoskeletal:         General: No tenderness or deformity. Normal range of motion.      Cervical back: Normal range of motion and neck supple.   Lymphadenopathy:      Cervical: No cervical adenopathy.   Skin:     General: Skin is warm and dry.      Coloration: Skin is not pale.      Findings: No rash.   Neurological:      Mental Status: She is lethargic.      Deep Tendon Reflexes: Reflexes are normal and symmetric. Reflexes normal.      Comments: The patient is moving her right hand in a rhythmic shaking fashion as though she is more uncomfortable, her granddaughter states she did this overnight when she would be more uncomfortable but after they gave her some Tylenol she did stop this was able to rest however the patient is now febrile this morning and appears uncomfortable again in his moving her right arm, she is not moving the left arm today            Laboratory Data:     Results for orders placed or performed during the hospital encounter of 10/21/23 (from the past 24 hour(s))   CAROTID ARTERY DUPLEX   Result Value Ref Range    Right CCA prox sys 30.6 cm/s    Right CCA prox dias 3 cm/s    Right CCA mid sys 24.5     Left CCA mid dias 5.2     Right cca dist sys 26.9 cm/s    Right CCA dist dias 7.2 cm/s    Right Carotid Bulb Sys  26.4     Right Carotid Bulb Dias 6.3     Right eca sys 364.9 cm/s    Right vertebral sys 31.6 cm/s    Left CCA prox sys 61.4 cm/s    Left CCA prox dias 14.3 cm/s    Left CCA mid sys 56     Left CCA mid dias 8.4     Left CCA dist sys 64 cm/s    Left CCA dist dias 12.6 cm/s     Left Carotid Bubl Sys 37.5     Left Carotid Bulb Dias 7.2     Left ICA prox sys 89.5 cm/s    Left ICA prox dias 22.8 cm/s    Left ICA mid sys 141 cm/s    Left ICA mid dias 44.7 cm/s    Left ICA dist sys 146.3 cm/s    Left ICA dist dias 40.1 cm/s    Left ECA sys 240.1 cm/s    Left vertebral sys 59.6 cm/s    Left ICA/CCA sys 2.29    COMPREHENSIVE METABOLIC PNL, FASTING   Result Value Ref Range    SODIUM 148 (H) 136 - 145 mmol/L    POTASSIUM 4.2 3.5 - 5.1 mmol/L    CHLORIDE 120 (H) 96 - 111 mmol/L    CO2 TOTAL 22 (L) 23 - 31 mmol/L    ANION GAP 6 4 - 13 mmol/L    BUN 84 (H) 8 - 25 mg/dL    CREATININE 1.30 (H) 0.60 - 1.05 mg/dL    BUN/CREA RATIO 28 (H) 6 - 22    ALBUMIN 2.0 (L) 3.4 - 4.8 g/dL     CALCIUM 9.6 8.6 - 86.5 mg/dL    GLUCOSE 784 (H) 70 - 99 mg/dL    ALKALINE PHOSPHATASE 49 (L) 55 - 145 U/L    ALT (SGPT) 21 <31 U/L    AST (SGOT)  39 (H) 11 - 34 U/L    BILIRUBIN TOTAL 0.5 0.3 - 1.3 mg/dL    PROTEIN TOTAL 5.9 (L) 6.0 - 8.0 g/dL    ESTIMATED GFR - FEMALE 14 (L) >=60 mL/min/BSA   LACTIC ACID LEVEL W/ REFLEX FOR LEVEL >2.0   Result Value Ref Range    LACTIC ACID 1.4 0.5 - 2.2 mmol/L   CBC WITH DIFF   Result Value Ref Range    WBC 13.4 (H) 3.7 - 11.0 x10^3/uL    RBC 3.86 3.85 - 5.22 x10^6/uL    HGB 11.4 (L) 11.5 - 16.0 g/dL    HCT 69.6 29.5 - 28.4 %    MCV 95.1 78.0 - 100.0 fL    MCH 29.5 26.0 - 32.0 pg    MCHC 31.1 31.0 - 35.5 g/dL    RDW-CV 13.2 44.0 - 10.2 %    PLATELETS 235 150 - 400 x10^3/uL    MPV 9.6 8.7 - 12.5 fL    NEUTROPHIL % 74.0 %    LYMPHOCYTE % 13.9 %    MONOCYTE % 10.4 %    EOSINOPHIL % 0.7 %    BASOPHIL % 0.4 %    NEUTROPHIL # 9.93 (H) 1.50 - 7.70 x10^3/uL    LYMPHOCYTE # 1.86 1.00 - 4.80 x10^3/uL    MONOCYTE # 1.39 (H) 0.20 - 1.10 x10^3/uL    EOSINOPHIL # 0.10 <=0.50 x10^3/uL    BASOPHIL # <0.10 <=0.20 x10^3/uL    IMMATURE GRANULOCYTE % 0.6 0.0 - 1.0 %    IMMATURE GRANULOCYTE # <0.10 <0.10 x10^3/uL   TROPONIN-I  Result Value Ref Range    TROPONIN-I HS 347.1 (HH) <=14.0 ng/L ng/L       Imaging  Studies:    XR AP MOBILE CHEST   Final Result by Edi, Radresults In (12/30 1049)   FINDINGS: Impression: Gas under the right hemidiaphragm is favored colonic gas. Small left greater than right pleural effusions with bibasilar atelectasis. No pneumothorax. The heart is enlarged. High riding right humeral head consistent with chronic high-grade cuff tears. Vascular congestion without overt edema.         FJG               Radiologist location ID: [id number]         * CT BRAIN WO IV CONTRAST   Final Result by Edi, Radresults In (12/29 0801)   No evidence for acute intracranial process. Chronic white matter microvascular ischemic change.               The CT exam was performed using one or more of the following dose reduction techniques: Automated exposure control, adjustment of the mA and/or kV according to the patient's size, or use of iterative reconstruction technique.               Radiologist location ID: [id number]         * MRI BRAIN WO CONTRAST   Final Result by Edi, Radresults In (12/27 1243)   Chronic changes as above without acute intracranial abnormality.         JLF                     Radiologist location ID: [id number]         CT CHEST WO IV CONTRAST   Final Result by Edi, Radresults In (12/27 1245)   1. Small bilateral pleural effusions and bilateral lower lung atelectatic change.   2. Cardiomegaly and pulmonary artery prominence as above.   3. Posttreatment changes right chest wall.               The CT exam was performed using one or more of the following dose reduction techniques: Automated exposure control, adjustment of the mA and/or kV according to the patient's size, or use of iterative reconstruction technique.               Radiologist location ID: [id number]         XR AP MOBILE CHEST   Final Result by Edi, Radresults In (12/26 2050)   No acute process.                        Radiologist location ID: [id number]         CT LUMBAR SPINE WO IV CONTRAST   Final Result by Edi, Radresults  In (12/25 0720)      No acute fracture of the lumbar spine.      Multilevel lumbar spondylosis. See description at each specific level above.      Mild scoliosis.          The CT exam was performed using one or more of the following dose reduction techniques: Automated exposure control, adjustment of the mA and/or kV according to the patient's size, or use of iterative reconstruction technique.               Radiologist location ID: [id number]         CT THORACIC SPINE WO IV CONTRAST   Final Result by Edi, Radresults  In (12/25 6578)      No acute fracture or subluxation of the thoracic spine.      Moderate scoliosis. Mild multilevel degenerative change.      Mild motion artifact.             The CT exam was performed using one or more of the following dose reduction techniques: Automated exposure control, adjustment of the mA and/or kV according to the patient's size, or use of iterative reconstruction technique.               Radiologist location ID: [id number]         CT BRAIN WO IV CONTRAST   Final Result by Edi, Radresults In (12/25 0707)   No acute intracranial abnormality.               The CT exam was performed using one or more of the following dose reduction techniques: Automated exposure control, adjustment of the mA and/or kV according to the patient's size, or use of iterative reconstruction technique.               Radiologist location ID: [id number]         XR AP MOBILE CHEST   Final Result by Edi, Radresults In (12/25 0706)      No acute cardiopulmonary process. Chronic changes.             Radiologist location ID: [id number]             Assessment/Plan:  Hospital Problems    1AKI (acute kidney injury) (CMS [facility])         Date Noted: 10/21/2023      Hypernatremia         Date Noted: 10/23/2023      Urinary tract infection         Date Noted: 10/23/2023      Dehydration         Date Noted: 10/21/2023      Atrial fibrillation (CMS HCC)         Date Noted: 02/27/2017      Essential hypertension          Date Noted: 01/16/2017      Hyperlipidemia         Date Noted: 01/16/2017      Hypothyroidism         Date Noted: 01/16/2017      Resolved Hospital Problems  No resolved problems to display.      Patient continues to have acute kidney injury the patient is now febrile and appears septic with tachycardia present as well we have ordered lactic acid as well as a fluid bolus.  Chest x-ray shows some pulmonary vascular congestion and maybe some small effusions in the base of the lungs but also noted to have some air above the liver this may be colonic gas however patient appears very uncomfortable in his somewhat tender when I am palpating her abdomen.  CT abdomen pelvis has been ordered.    Patient's urine output has been fair urine does have bacteria present but cultures has been negative.  I am uncertain if this is the source of infection but she has had an elevated white blood count for three days and has been on Rocephin.  We are going to extend coverage with Flagyl.  There may be abdominal source CT has been ordered     The patient is more lethargic today.  Even though CTs has been negative I think the  patient probably has had some global ischemia.  Carotid Doppler did show occlusion on the right and left side with 50-69% stenosis on the internal carotid on the left.  Think the patient likely is having some ischemia causing her to have the increased lethargy.    Patient is febrile this morning.  She has received IV Tylenol.  She has not had a consistent gag reflex to be able to allow her take anything by mouth.    Overall patient is very ill appears to be septic at this point from an unknown source and continues to be very lethargic.  Feel overall the patient's prognosis is quite poor.  Family wants to continue with IV antibiotics and IV fluids.  They did say the patient does not want to be intubated or be on life support have chest compressions.  Believe the patient would also not want to have a feeding  tube.    That has right the patient having IV fluids she continues to have worsened renal function.    Kellie Shropshire, MD    This note was partially created using MModal Fluency Direct system (voice recognition software) and is inherently subject to errors including those of syntax and "sound-alike" substitutions which may escape proofreading.  In such instances, original meaning may be extrapolated by contextual derivation.

## 2023-10-26 NOTE — Nurses Notes (Signed)
Med lock leaking in L AC dc'd.  #24 placed in right hand per Jaclynn Guarneri RN

## 2023-10-27 MED ORDER — ATROPINE 1 % EYE DROPS
2.0000 [drp] | OPHTHALMIC | Status: DC | PRN
Start: 2023-10-27 — End: 2023-10-28
  Administered 2023-10-27: 2 [drp] via SUBLINGUAL
  Filled 2023-10-27: qty 5

## 2023-10-27 NOTE — Respiratory Therapy (Signed)
Patient remains on two liters.  Patient is comfort measures.

## 2023-10-27 NOTE — Progress Notes (Signed)
[facility]  Hospitalist  Progress Note    Date of Service:  10/27/2023  [name], [name], 89 y.o. female  Date of Admission:  10/21/2023  Date of Birth:  May 21, 1929  PCP: Marcelino Freestone, FNP    HPI:  Patient has been resting fair overnight.  The patient does have some increase in discomfort overnight and required a dose of Ativan and a dose of morphine.  She seems to be controlling her secretions fairly at this point but is having some gurgling and crackling sounds that has been distressing the family.  We are going to start atropine drops.  Family has no concerns at this point they has been at the bedside    Current Facility-Administered Medications   Medication Dose Route Frequency    atropine (ISOPTO ATROPINE) 1% ophthalmic solution  2 Drop Sublingual Q2H PRN    fentaNYL (SUBLIMAZE) 50 mcg/mL injection  50 mcg Intravenous Q2H PRN    LORazepam (ATIVAN) 2 mg/mL injection  1 mg Intravenous Q4H PRN    morphine 2 mg/mL injection  2 mg Intravenous Q2H PRN    morphine 4 mg/mL injection  4 mg Intravenous Q2H PRN        No Known Allergies          Filed Vitals:    10/26/23 1940 10/26/23 2315 10/27/23 0500 10/27/23 0726   BP: (!) 111/53 (!) 115/51 114/61 124/62   Pulse: 79 91 (!) 108 (!) 109   Resp: 20 16 18 20    Temp: 36.8 C (98.3 F) 37.1 C (98.7 F) 36.7 C (98.1 F) 36.6 C (97.8 F)   SpO2:         O2 delivery: OxyMask      Physical Exam  Constitutional:       Comments: Patient is lying in bed with OxyMask in place, she does not respond to verbal stimuli or touching of the shoulder   HENT:      Head: Normocephalic and atraumatic.      Right Ear: External ear normal.      Left Ear: External ear normal.   Neck:      Thyroid: No thyromegaly.      Vascular: No JVD.   Cardiovascular:      Rate and Rhythm: Normal rate and regular rhythm.      Heart sounds: Murmur heard.      No friction rub. No gallop.   Pulmonary:      Effort: No respiratory distress.      Breath sounds: Rhonchi present. No  wheezing or rales.      Comments: Patient is wearing a OxyMask at 4 liters, she is breathing with some increase in respiratory effort but is not having grunting or appearance of distress at this time  Chest:      Chest wall: No tenderness.   Abdominal:      General: Bowel sounds are normal. There is no distension.      Palpations: Abdomen is soft. There is no mass.      Tenderness: There is no abdominal tenderness. There is no guarding or rebound.   Musculoskeletal:         General: No tenderness or deformity. Normal range of motion.      Cervical back: Normal range of motion and neck supple.      Comments: 3+ edema the patient's left upper extremity, 1+ edema of the lower legs and right upper extremity   Lymphadenopathy:      Cervical:  No cervical adenopathy.   Skin:     General: Skin is warm and dry.      Coloration: Skin is not pale.      Findings: No rash.   Neurological:      Comments: Obtunded, appears comfortable not communicating with family, no evidence of distress            Laboratory Data:     Results for orders placed or performed during the hospital encounter of 10/21/23 (from the past 24 hour(s))   COMPREHENSIVE METABOLIC PNL, FASTING   Result Value Ref Range    SODIUM 148 (H) 136 - 145 mmol/L    POTASSIUM 4.2 3.5 - 5.1 mmol/L    CHLORIDE 120 (H) 96 - 111 mmol/L    CO2 TOTAL 22 (L) 23 - 31 mmol/L    ANION GAP 6 4 - 13 mmol/L    BUN 84 (H) 8 - 25 mg/dL    CREATININE 4.78 (H) 0.60 - 1.05 mg/dL    BUN/CREA RATIO 28 (H) 6 - 22    ALBUMIN 2.0 (L) 3.4 - 4.8 g/dL     CALCIUM 9.6 8.6 - 29.5 mg/dL    GLUCOSE 621 (H) 70 - 99 mg/dL    ALKALINE PHOSPHATASE 49 (L) 55 - 145 U/L    ALT (SGPT) 21 <31 U/L    AST (SGOT)  39 (H) 11 - 34 U/L    BILIRUBIN TOTAL 0.5 0.3 - 1.3 mg/dL    PROTEIN TOTAL 5.9 (L) 6.0 - 8.0 g/dL    ESTIMATED GFR - FEMALE 14 (L) >=60 mL/min/BSA   LACTIC ACID LEVEL W/ REFLEX FOR LEVEL >2.0   Result Value Ref Range    LACTIC ACID 1.4 0.5 - 2.2 mmol/L   CBC WITH DIFF   Result Value Ref Range    WBC  13.4 (H) 3.7 - 11.0 x10^3/uL    RBC 3.86 3.85 - 5.22 x10^6/uL    HGB 11.4 (L) 11.5 - 16.0 g/dL    HCT 30.8 65.7 - 84.6 %    MCV 95.1 78.0 - 100.0 fL    MCH 29.5 26.0 - 32.0 pg    MCHC 31.1 31.0 - 35.5 g/dL    RDW-CV 96.2 95.2 - 84.1 %    PLATELETS 235 150 - 400 x10^3/uL    MPV 9.6 8.7 - 12.5 fL    NEUTROPHIL % 74.0 %    LYMPHOCYTE % 13.9 %    MONOCYTE % 10.4 %    EOSINOPHIL % 0.7 %    BASOPHIL % 0.4 %    NEUTROPHIL # 9.93 (H) 1.50 - 7.70 x10^3/uL    LYMPHOCYTE # 1.86 1.00 - 4.80 x10^3/uL    MONOCYTE # 1.39 (H) 0.20 - 1.10 x10^3/uL    EOSINOPHIL # 0.10 <=0.50 x10^3/uL    BASOPHIL # <0.10 <=0.20 x10^3/uL    IMMATURE GRANULOCYTE % 0.6 0.0 - 1.0 %    IMMATURE GRANULOCYTE # <0.10 <0.10 x10^3/uL   TROPONIN-I   Result Value Ref Range    TROPONIN-I HS 347.1 (HH) <=14.0 ng/L ng/L       Imaging Studies:    CT ABDOMEN PELVIS WO IV CONTRAST   Final Result by Edi, Radresults In (12/30 1419)      1. Moderate diffuse colonic stool and gas retention. No evidence of small bowel obstruction.      2. Trace bibasilar pleural effusions.      3. No acute inflammatory changes.  The CT exam was performed using one or more of the following dose reduction techniques: Automated exposure control, adjustment of the mA and/or kV according to the patient's size, or use of iterative reconstruction technique.               Radiologist location ID: [id number]         XR AP MOBILE CHEST   Final Result by Edi, Radresults In (12/30 1049)   FINDINGS: Impression: Gas under the right hemidiaphragm is favored colonic gas. Small left greater than right pleural effusions with bibasilar atelectasis. No pneumothorax. The heart is enlarged. High riding right humeral head consistent with chronic high-grade cuff tears. Vascular congestion without overt edema.         FJG               Radiologist location ID: [id number]         * CT BRAIN WO IV CONTRAST   Final Result by Edi, Radresults In (12/29 0801)   No evidence for acute intracranial  process. Chronic white matter microvascular ischemic change.               The CT exam was performed using one or more of the following dose reduction techniques: Automated exposure control, adjustment of the mA and/or kV according to the patient's size, or use of iterative reconstruction technique.               Radiologist location ID: [id number]         * MRI BRAIN WO CONTRAST   Final Result by Edi, Radresults In (12/27 1243)   Chronic changes as above without acute intracranial abnormality.         JLF                     Radiologist location ID: [id number]         CT CHEST WO IV CONTRAST   Final Result by Edi, Radresults In (12/27 1245)   1. Small bilateral pleural effusions and bilateral lower lung atelectatic change.   2. Cardiomegaly and pulmonary artery prominence as above.   3. Posttreatment changes right chest wall.               The CT exam was performed using one or more of the following dose reduction techniques: Automated exposure control, adjustment of the mA and/or kV according to the patient's size, or use of iterative reconstruction technique.               Radiologist location ID: [id number]         XR AP MOBILE CHEST   Final Result by Edi, Radresults In (12/26 2050)   No acute process.                        Radiologist location ID: [id number]         CT LUMBAR SPINE WO IV CONTRAST   Final Result by Edi, Radresults In (12/25 0720)      No acute fracture of the lumbar spine.      Multilevel lumbar spondylosis. See description at each specific level above.      Mild scoliosis.          The CT exam was performed using one or more of the following dose reduction techniques: Automated exposure control, adjustment of the mA and/or kV according to the patient's size, or use of iterative reconstruction technique.  Radiologist location ID: [id number]         CT THORACIC SPINE WO IV CONTRAST   Final Result by Edi, Radresults In (12/25 8469)      No acute fracture or subluxation of  the thoracic spine.      Moderate scoliosis. Mild multilevel degenerative change.      Mild motion artifact.             The CT exam was performed using one or more of the following dose reduction techniques: Automated exposure control, adjustment of the mA and/or kV according to the patient's size, or use of iterative reconstruction technique.               Radiologist location ID: [id number]         CT BRAIN WO IV CONTRAST   Final Result by Edi, Radresults In (12/25 0707)   No acute intracranial abnormality.               The CT exam was performed using one or more of the following dose reduction techniques: Automated exposure control, adjustment of the mA and/or kV according to the patient's size, or use of iterative reconstruction technique.               Radiologist location ID: [id number]         XR AP MOBILE CHEST   Final Result by Edi, Radresults In (12/25 0706)      No acute cardiopulmonary process. Chronic changes.             Radiologist location ID: [id number]             Assessment/Plan:  Hospital Problems    1AKI (acute kidney injury) (CMS [facility])         Date Noted: 10/21/2023      Hypernatremia         Date Noted: 10/23/2023      Urinary tract infection         Date Noted: 10/23/2023      Dehydration         Date Noted: 10/21/2023      Atrial fibrillation (CMS HCC)         Date Noted: 02/27/2017      Essential hypertension         Date Noted: 01/16/2017      Hyperlipidemia         Date Noted: 01/16/2017      Hypothyroidism         Date Noted: 01/16/2017      Resolved Hospital Problems  No resolved problems to display.      Patient is now comfort measures only    Patient has been comfortable thus far.  Family is at the bedside.  We have started atropine to help with secretions.  Continue morphine and Ativan for comfort.    Kellie Shropshire, MD    This note was partially created using MModal Fluency Direct system (voice recognition software) and is inherently subject to errors including those of  syntax and "sound-alike" substitutions which may escape proofreading.  In such instances, original meaning may be extrapolated by contextual derivation.

## 2023-10-27 NOTE — Nurses Notes (Signed)
PRN morphine given for restlessness.  No further needs at this time.  Call light in reach.  Juanda Bond, RN

## 2023-10-27 NOTE — Nurses Notes (Signed)
Prn Ativan administered at this time per family requested or increased agitation and restlessness.

## 2023-10-27 NOTE — Speech Therapy (Signed)
Pt is comfort measures only at this time, SLP to defer and discontinue ST order. SLP will be available for further consultation as needed.

## 2023-10-27 NOTE — Care Plan (Signed)
Problem: Skin Injury Risk Increased  Goal: Skin Health and Integrity  Outcome: Ongoing (see interventions/notes)  Intervention: Optimize Skin Protection  Recent Flowsheet Documentation  Taken 10/27/2023 0950 by Philis Pique, RN  Pressure Reduction Techniques: Heels elevated off of the bed  Pressure Reduction Devices: Repositioning wedges/pillows utilized  Skin Protection: adhesive use limited     Problem: Adult Inpatient Plan of Care  Goal: Optimal Comfort and Wellbeing  Outcome: Ongoing (see interventions/notes)  Intervention: Provide Person-Centered Care  Recent Flowsheet Documentation  Taken 10/27/2023 0920 by Philis Pique, RN  Trust Relationship/Rapport: care explained     Patient remains unresponsive.  Family remains at bedside.  No PRN pain medication given this shift.    Juanda Bond, RN

## 2023-10-27 NOTE — Nurses Notes (Signed)
Pt more alert than previous shift able to carry short conversations with family at this time. Family denies need for Prn medications at this time

## 2023-10-27 NOTE — Nurses Notes (Signed)
Prn morphine administered at this time per family request for increased restlessness and discomfort.

## 2023-10-28 DIAGNOSIS — I6320 Cerebral infarction due to unspecified occlusion or stenosis of unspecified precerebral arteries: Secondary | ICD-10-CM | POA: Diagnosis present

## 2023-10-28 DEATH — deceased

## 2023-11-28 NOTE — Nurses Notes (Signed)
Family wants no autopsy. CORE contact before and after death and it was determined that she was not a candidate for any donation and that we can release the body to the funeral home of the family's choosing.

## 2023-11-28 NOTE — Nurses Notes (Signed)
[facility] discharged to [name] at this time.

## 2023-11-28 NOTE — ED Attending Note (Addendum)
Pronouncement note      Time of Death,4:02 am    Time Pronounced 4:40 am    Darliss Cheney, MD    [facility] Pronouncement Only record [phone]

## 2023-11-28 NOTE — Expiration Summary (Addendum)
EXPIRATION SUMMARY        PATIENT NAME:  [name]   MRN:  [id number]  DOB:  November 09, 1928    ADMISSION DATE:  10-31-2023  EXPIRATION DATE:      PRIMARY CARE PHYSICIAN: Marcelino Freestone, FNP     ADMISSION DIAGNOSIS:  AKI (acute kidney injury) (CMS HCC) [N17.9]    FINAL DIAGNOSIS:  CVA due to stenosis of a pre cerebral artery     Principle Problem:  Cerebrovascular accident (CVA) due to stenosis of precerebral artery (CMS [facility])    Active Hospital Problems    Diagnosis Date Noted    Principal Problem: Cerebrovascular accident (CVA) due to stenosis of precerebral artery (CMS HCC) [I63.20] 11/16/2023    Hypernatremia [E87.0] 10/23/2023    Urinary tract infection [N39.0] 10/23/2023    AKI (acute kidney injury) (CMS HCC) [N17.9] 31-Oct-2023    Dehydration [E86.0] 31-Oct-2023    Atrial fibrillation (CMS HCC) [I48.91] 02/27/2017    Essential hypertension [I10] 01/16/2017    Hyperlipidemia [E78.5] 01/16/2017    Hypothyroidism [E03.9] 01/16/2017      Resolved Hospital Problems   No resolved problems to display.     Active Non-Hospital Problems    Diagnosis Date Noted    Acute on chronic HFrEF (heart failure with reduced ejection fraction) (CMS HCC) 08/11/2023    Nonrheumatic aortic valve stenosis 08/11/2023    Chest pain, rule out acute myocardial infarction 02/23/2022    Atherosclerosis of coronary artery 03/31/2017    Glaucoma 01/16/2017    History of breast cancer 01/16/2017        Code status prior to expiration: CMO/DNR    REASON FOR HOSPITALIZATION AND HOSPITAL COURSE:  This is a 89 y.o. female who is admitted for acute kidney injury and altered mental status.  Patient is seen with her daughter.  Daughter reports patient is usually ambulatory and talkative at home.  The daughter reported that yesterday evening patient was found time to go into her granddaughter is room and was difficult to redirect.  It appeared that she was having some difficulty breathing was  breathing heavily.  Some changes were made by Cardiology to her diuretics 2 days ago.  Patient's torsemide was discontinued and patient was started on Bumex 1 mg twice a day because of an elevated proBNP.  Patient was brought to the emergency room for evaluation.  In the emergency room laboratory studies and diagnostic imaging was obtained.  Chest x-ray showed no acute pulmonary process.  CT of the brain showed no acute intracranial abnormality.  CT of the thoracic and lumbar spine showed no acute fracture.  Troponin was slightly elevated at 123.8.  Repeat troponin was trending downward at 93.  Patient had a BUN of 67 and creatinine of 2.96.  Baseline creatinine is around 1.2.  Patient does have significant altered mental status and unable to answer any questions at the time of my exam.  Patient keeps eyes closed but is wiggling in the bed unable to keep her feet on the bed.  Patient keeps moaning as if in pain.  Patient will be placed under hospitalist service for continued medical management and treatment of AKI.     Patient is placed in the hospital and her troponin did trend upward peaking at 347.  The patient had had a CVA and I believe the heart was working harder in an effort to reperfuse the brain and caused demand ischemia    Patient had profound altered mentation this is started very abruptly prior  to coming to the hospital.  The patient had acute renal failure and was having worsened respirations as well.  Patient was found to have urinary tract infection.  She was started on antibiotics and seemed to be having some improvement however the improvement seemed to be more of a ralley.  The patient was able to communicate some with her family by looking at them and saying a few words but overall her condition continues to decline.  She has started have any higher white blood cell count she was having some fevers.  Despite IV fluids and antibiotics the patient's renal function continued to worsen.  She was  having weakness of her extremities and was not able to swallow and had upgoing Babinski bilaterally however MRI did not show any acute stroke.  Clinically the patient had a stroke and I believe this was from pre cerebral stenosis.  Believe the patient had altered mentation because of the CVA.  The patient was found to have complete occlusion of the right internal carotid artery and significant stenosis of the left internal carotid artery.  Believe the patient was having Will global ischemia and the patient became more and more obtunded.  I talked to the patient's family at length and they felt the patient did not want to have a feeding tube or have any invasive intervention.  They decided to make the patient comfort measures only.  She did remain fairly comfortable did require some doses of Ativan and morphine to help with air hunger and some discomfort.  The patient's family was able to stay at her bedside and the patient passed away this morning at 0402 hours.      cc: Primary Care Physician:  Marcelino Freestone, FNP  [address]  [place] [address]    YN:[id number] Physician:  No referring provider defined for this encounter.  [name] Shropshire, MD

## 2023-11-28 NOTE — Nurses Notes (Signed)
Called [facility] per family wishes. Post mortem care complete.

## 2023-11-28 DEATH — deceased
